# Patient Record
Sex: Male | Born: 1950 | Race: White | Hispanic: No | Marital: Single | Smoking: Former smoker
Health system: Southern US, Community
[De-identification: ages and names within clinical notes are randomized; demographics above are authoritative.]

## PROBLEM LIST (undated history)

## (undated) DIAGNOSIS — R7303 Prediabetes: Secondary | ICD-10-CM

## (undated) DIAGNOSIS — M1990 Unspecified osteoarthritis, unspecified site: Secondary | ICD-10-CM

## (undated) DIAGNOSIS — R55 Syncope and collapse: Secondary | ICD-10-CM

## (undated) DIAGNOSIS — F419 Anxiety disorder, unspecified: Secondary | ICD-10-CM

## (undated) DIAGNOSIS — I10 Essential (primary) hypertension: Secondary | ICD-10-CM

## (undated) DIAGNOSIS — F411 Generalized anxiety disorder: Secondary | ICD-10-CM

## (undated) DIAGNOSIS — Z86010 Personal history of colonic polyps: Secondary | ICD-10-CM

## (undated) DIAGNOSIS — M24662 Ankylosis, left knee: Secondary | ICD-10-CM

## (undated) DIAGNOSIS — L409 Psoriasis, unspecified: Secondary | ICD-10-CM

## (undated) DIAGNOSIS — T8859XA Other complications of anesthesia, initial encounter: Secondary | ICD-10-CM

## (undated) DIAGNOSIS — E785 Hyperlipidemia, unspecified: Secondary | ICD-10-CM

## (undated) DIAGNOSIS — Z860101 Personal history of adenomatous and serrated colon polyps: Secondary | ICD-10-CM

## (undated) DIAGNOSIS — N529 Male erectile dysfunction, unspecified: Secondary | ICD-10-CM

## (undated) DIAGNOSIS — T7840XA Allergy, unspecified, initial encounter: Secondary | ICD-10-CM

## (undated) DIAGNOSIS — R972 Elevated prostate specific antigen [PSA]: Secondary | ICD-10-CM

## (undated) DIAGNOSIS — N400 Enlarged prostate without lower urinary tract symptoms: Secondary | ICD-10-CM

## (undated) HISTORY — DX: Hyperlipidemia, unspecified: E78.5

## (undated) HISTORY — DX: Anxiety disorder, unspecified: F41.9

## (undated) HISTORY — DX: Allergy, unspecified, initial encounter: T78.40XA

## (undated) HISTORY — DX: Elevated prostate specific antigen (PSA): R97.20

## (undated) HISTORY — PX: APPENDECTOMY: SHX54

## (undated) HISTORY — PX: JOINT REPLACEMENT: SHX530

## (undated) HISTORY — DX: Unspecified osteoarthritis, unspecified site: M19.90

## (undated) HISTORY — DX: Psoriasis, unspecified: L40.9

## (undated) HISTORY — DX: Essential (primary) hypertension: I10

## (undated) HISTORY — PX: COLONOSCOPY: SHX174

---

## 2000-01-10 ENCOUNTER — Other Ambulatory Visit: Admission: RE | Admit: 2000-01-10 | Discharge: 2000-01-10 | Payer: Self-pay | Admitting: Urology

## 2010-02-14 ENCOUNTER — Encounter: Admission: RE | Admit: 2010-02-14 | Discharge: 2010-02-14 | Payer: Self-pay | Admitting: Family Medicine

## 2012-04-08 ENCOUNTER — Other Ambulatory Visit: Payer: Self-pay | Admitting: Internal Medicine

## 2012-04-10 ENCOUNTER — Other Ambulatory Visit: Payer: Self-pay

## 2012-04-10 ENCOUNTER — Encounter: Payer: Self-pay | Admitting: Physician Assistant

## 2012-04-10 NOTE — Telephone Encounter (Signed)
Error

## 2012-05-31 ENCOUNTER — Other Ambulatory Visit: Payer: Self-pay | Admitting: *Deleted

## 2012-05-31 ENCOUNTER — Ambulatory Visit (INDEPENDENT_AMBULATORY_CARE_PROVIDER_SITE_OTHER): Payer: BC Managed Care – PPO | Admitting: Internal Medicine

## 2012-05-31 ENCOUNTER — Encounter: Payer: Self-pay | Admitting: Internal Medicine

## 2012-05-31 VITALS — BP 126/88 | HR 78 | Temp 98.2°F | Resp 16 | Ht 66.5 in | Wt 176.4 lb

## 2012-05-31 DIAGNOSIS — I10 Essential (primary) hypertension: Secondary | ICD-10-CM | POA: Insufficient documentation

## 2012-05-31 DIAGNOSIS — E782 Mixed hyperlipidemia: Secondary | ICD-10-CM

## 2012-05-31 DIAGNOSIS — R972 Elevated prostate specific antigen [PSA]: Secondary | ICD-10-CM

## 2012-05-31 DIAGNOSIS — Z79899 Other long term (current) drug therapy: Secondary | ICD-10-CM

## 2012-05-31 DIAGNOSIS — F419 Anxiety disorder, unspecified: Secondary | ICD-10-CM

## 2012-05-31 DIAGNOSIS — E785 Hyperlipidemia, unspecified: Secondary | ICD-10-CM | POA: Insufficient documentation

## 2012-05-31 DIAGNOSIS — F411 Generalized anxiety disorder: Secondary | ICD-10-CM

## 2012-05-31 DIAGNOSIS — E789 Disorder of lipoprotein metabolism, unspecified: Secondary | ICD-10-CM

## 2012-05-31 DIAGNOSIS — Z0000 Encounter for general adult medical examination without abnormal findings: Secondary | ICD-10-CM

## 2012-05-31 LAB — COMPREHENSIVE METABOLIC PANEL
ALT: 20 U/L (ref 0–53)
AST: 19 U/L (ref 0–37)
Albumin: 4.4 g/dL (ref 3.5–5.2)
Alkaline Phosphatase: 42 U/L (ref 39–117)
CO2: 26 mEq/L (ref 19–32)
Calcium: 9 mg/dL (ref 8.4–10.5)
Glucose, Bld: 110 mg/dL — ABNORMAL HIGH (ref 70–99)
Sodium: 139 mEq/L (ref 135–145)
Total Bilirubin: 0.5 mg/dL (ref 0.3–1.2)
Total Protein: 6.5 g/dL (ref 6.0–8.3)

## 2012-05-31 MED ORDER — LISINOPRIL 5 MG PO TABS: 5.0000 mg | ORAL_TABLET | Freq: Every day | ORAL | Status: DC

## 2012-05-31 MED ORDER — CYCLOBENZAPRINE HCL 10 MG PO TABS: 10.0000 mg | ORAL_TABLET | Freq: Two times a day (BID) | ORAL | Status: DC | PRN

## 2012-05-31 MED ORDER — ALPRAZOLAM 1 MG PO TABS: 1.0000 mg | ORAL_TABLET | Freq: Every evening | ORAL | Status: DC | PRN

## 2012-05-31 NOTE — Progress Notes (Signed)
  Subjective:    Patient ID: John Wagner, male    DOB: 12/06/1951, 61 y.o.   MRN: [id number]  HPI All problems stable Exposed to mrsa at nursing place.   Review of Systems unchanged    Objective:   Physical Exam  Constitutional: He is oriented to person, place, and time. He appears well-developed and well-nourished.  HENT:  Right Ear: External ear normal.  Left Ear: External ear normal.  Mouth/Throat: Oropharynx is clear and moist.  Eyes: EOM are normal.  Neck: Normal range of motion.  Cardiovascular: Normal rate, regular rhythm and normal heart sounds.   Pulmonary/Chest: Effort normal and breath sounds normal.  Abdominal: Bowel sounds are normal. He exhibits no mass. There is no tenderness.  Neurological: He is alert and oriented to person, place, and time.  Skin: Skin is warm and dry.  Psychiatric: He has a normal mood and affect.    No results found for this or any previous visit.      Assessment & Plan:  All issues stable RF all meds

## 2012-05-31 NOTE — Patient Instructions (Signed)
Community-Associated MRSA CA-MRSA stands for community-associated methicillin-resistant Staphylococcus aureus. MRSA is a type of bacteria that is resistant to some common antibiotics. It can cause infections in the skin and many other places in the body. Staphylococcus aureus, often called "staph," is a bacteria that normally lives on the skin or in the nose. Staph on the surface of the skin or in the nose does not cause problems. However, if the staph enters the body through a cut, wound, or break in the skin, an infection can happen. Up until recently, infections with the MRSA type of staph mainly occurred in hospitals and other healthcare settings. There are now increasing problems with MRSA infections in the community as well. Infections with MRSA may be very serious or even life-threatening. CA-MRSA is becoming more common. It is known to spread in crowded settings, in jails and prisons, and in situations where there is close skin-to-skin contact, such as during sporting events or in locker rooms. MRSA can be spread through shared items, such as children's toys, razors, towels, or sports equipment.  CAUSES All staph, including MRSA, are normally harmless unless they enter the body through a scratch, cut, or wound, such as with surgery. All staph, including MRSA, can be spread from person-to-person by touching contaminated objects or through direct contact.  MRSA now causes illness in people who have not been in hospitals or other healthcare facilities. Cases of MRSA diseases in the community have been associated with:   Recent antibiotic use.   Sharing contaminated towels or clothes.   Having active skin diseases.   Participating in contact sports.   Living in crowded settings.   Intravenous (IV) drug use.   Community-associated MRSA infections are usually skin infections, but may cause other severe illnesses.   Staph bacteria are one of the most common causes of skin infection. However,  they are also a common cause of pneumonia, bone or joint infections, and bloodstream infections.  DIAGNOSIS Diagnosis of MRSA is done by cultures of fluid samples that may come from:  Swabs taken from cuts or wounds in infected areas.   Nasal swabs.   Saliva or deep cough specimens from the lungs (sputum).   Urine.   Blood.  Many people are "colonized" with MRSA but have no signs of infection. This means that people carry the MRSA germ on their skin or in their nose and may never develop MRSA infection.  TREATMENT  Treatment varies and is based on how serious, how deep, or how extensive the infection is. For example:  Some skin infections, such as a small boil or abscess, may be treated by draining yellowish-white fluid (pus) from the site of the infection.   Deeper or more widespread soft tissue infections are usually treated with surgery to drain pus and with antibiotic medicine given by vein or by mouth. This may be recommended even if you are pregnant.   Serious infections may require a hospital stay.  If antibiotics are given, they may be needed for several weeks. PREVENTION Because many people are colonized with staph, including MRSA, preventing the spread of the bacteria from person-to-person is most important. The best way to prevent the spread of bacteria and other germs is through proper hand washing or by using alcohol-based hand disinfectants. The following are other ways to help prevent MRSA infection within community settings.   Wash your hands frequently with soap and water for at least 15 seconds. Otherwise, use alcohol-based hand disinfectants when soap and water is not available.     Make sure people who live with you wash their hands often, too.   Do not share personal items. For example, avoid sharing razors and other personal hygiene items, towels, clothing, and athletic equipment.   Wash and dry your clothes and bedding at the warmest temperatures recommended on  the labels.   Keep wounds covered. Pus from infected sores may contain MRSA and other bacteria. Keep cuts and abrasions clean and covered with germ-free (sterile), dry bandages until they are healed.   If you have a wound that appears infected, ask your caregiver if a culture for MRSA and other bacteria should be done.   If you are breastfeeding, talk to your caregiver about MRSA. You may be asked to temporarily stop breastfeeding.  HOME CARE INSTRUCTIONS   Take your antibiotics as directed. Finish them even if you start to feel better.   Avoid close contact with those around you as much as possible. Do not use towels, razors, toothbrushes, bedding, or other items that will be used by others.   To fight the infection, follow your caregiver's instructions for wound care. Wash your hands before and after changing your bandages.   If you have an intravascular device, such as a catheter, make sure you know how to care for it.   Be sure to tell any healthcare providers that you have MRSA so they are aware of your infection.  SEEK IMMEDIATE MEDICAL CARE IF:  The infection appears to be getting worse. Signs include:   Increased warmth, redness, or tenderness around the wound site.   A red line that extends from the infection site.   A dark color in the area around the infection.   Wound drainage that is tan, yellow, or green.   A bad smell coming from the wound.   You feel sick to your stomach (nauseous) and throw up (vomit) or cannot keep medicine down.   You have a fever.   Your baby is older than 3 months with a rectal temperature of 102 F (38.9 C) or higher.   Your baby is 3 months old or younger with a rectal temperature of 100.4 F (38 C) or higher.   You have difficulty breathing.  MAKE SURE YOU:   Understand these instructions.   Will watch your condition.   Will get help right away if you are not doing well or get worse.  Document Released: 03/13/2006 Document  Revised: 11/27/2011 Document Reviewed: 03/13/2011 [employer] Patient Information 2012 [employer]. 

## 2012-08-09 ENCOUNTER — Other Ambulatory Visit: Payer: Self-pay | Admitting: Internal Medicine

## 2012-10-05 ENCOUNTER — Other Ambulatory Visit: Payer: Self-pay | Admitting: Internal Medicine

## 2012-10-05 NOTE — Telephone Encounter (Signed)
At tl desk 

## 2012-12-06 ENCOUNTER — Ambulatory Visit (INDEPENDENT_AMBULATORY_CARE_PROVIDER_SITE_OTHER): Payer: BC Managed Care – PPO | Admitting: Internal Medicine

## 2012-12-06 ENCOUNTER — Encounter: Payer: Self-pay | Admitting: Internal Medicine

## 2012-12-06 VITALS — BP 131/95 | HR 86 | Temp 97.8°F | Resp 16 | Ht 66.5 in | Wt 182.0 lb

## 2012-12-06 DIAGNOSIS — E785 Hyperlipidemia, unspecified: Secondary | ICD-10-CM

## 2012-12-06 DIAGNOSIS — L409 Psoriasis, unspecified: Secondary | ICD-10-CM | POA: Insufficient documentation

## 2012-12-06 DIAGNOSIS — F419 Anxiety disorder, unspecified: Secondary | ICD-10-CM

## 2012-12-06 DIAGNOSIS — F411 Generalized anxiety disorder: Secondary | ICD-10-CM

## 2012-12-06 DIAGNOSIS — Z0000 Encounter for general adult medical examination without abnormal findings: Secondary | ICD-10-CM

## 2012-12-06 DIAGNOSIS — I10 Essential (primary) hypertension: Secondary | ICD-10-CM

## 2012-12-06 DIAGNOSIS — Z79899 Other long term (current) drug therapy: Secondary | ICD-10-CM

## 2012-12-06 LAB — CBC WITH DIFFERENTIAL/PLATELET
Basophils Relative: 1 % (ref 0–1)
Eosinophils Absolute: 0.1 10*3/uL (ref 0.0–0.7)
Eosinophils Relative: 2 % (ref 0–5)
Lymphocytes Relative: 35 % (ref 12–46)
Lymphs Abs: 1.5 10*3/uL (ref 0.7–4.0)
MCH: 28.8 pg (ref 26.0–34.0)
Monocytes Absolute: 0.5 10*3/uL (ref 0.1–1.0)
Monocytes Relative: 13 % — ABNORMAL HIGH (ref 3–12)
Neutro Abs: 2.1 10*3/uL (ref 1.7–7.7)
Neutrophils Relative %: 49 % (ref 43–77)
RBC: 5.11 MIL/uL (ref 4.22–5.81)

## 2012-12-06 LAB — LIPID PANEL
HDL: 51 mg/dL (ref >39)
Total CHOL/HDL Ratio: 4.5 Ratio

## 2012-12-06 LAB — POCT URINALYSIS DIPSTICK
Ketones, UA: NEGATIVE
Leukocytes, UA: NEGATIVE
Nitrite, UA: NEGATIVE
Protein, UA: NEGATIVE
Spec Grav, UA: 1.015
Urobilinogen, UA: 0.2
pH, UA: 6.5

## 2012-12-06 LAB — POCT UA - MICROSCOPIC ONLY
Bacteria, U Microscopic: NEGATIVE
Yeast, UA: NEGATIVE

## 2012-12-06 LAB — COMPREHENSIVE METABOLIC PANEL
ALT: 24 U/L (ref 0–53)
Albumin: 4.2 g/dL (ref 3.5–5.2)
Alkaline Phosphatase: 49 U/L (ref 39–117)
Chloride: 105 mEq/L (ref 96–112)
Creat: 0.97 mg/dL (ref 0.50–1.35)
Sodium: 140 mEq/L (ref 135–145)
Total Protein: 6.5 g/dL (ref 6.0–8.3)

## 2012-12-06 MED ORDER — ALPRAZOLAM 1 MG PO TABS: 1.0000 mg | ORAL_TABLET | Freq: Two times a day (BID) | ORAL | Status: DC | PRN

## 2012-12-06 NOTE — Progress Notes (Signed)
  Subjective:    Patient ID: John Wagner, male    DOB: 1951-03-07, 61 y.o.   MRN: [id number]  HPI Doing well, elevated PSA is improving and Dr. Vernie Ammons did nl DRE October. Colonocopy 2 yrs ago, one polyp. HTN controlled   Review of Systems Joint pains are chronic, left knee the worst    Objective:   Physical Exam  Vitals reviewed. Constitutional: He is oriented to person, place, and time. He appears well-developed and well-nourished. No distress.  HENT:  Right Ear: External ear normal.  Left Ear: External ear normal.  Nose: Nose normal.  Mouth/Throat: Oropharynx is clear and moist.  Eyes: EOM are normal. Pupils are equal, round, and reactive to light. No scleral icterus.  Neck: Normal range of motion. No tracheal deviation present. No thyromegaly present.  Cardiovascular: Normal rate, regular rhythm and normal heart sounds.   Pulmonary/Chest: Effort normal and breath sounds normal.  Abdominal: Soft. Bowel sounds are normal. He exhibits no mass. There is no tenderness.  Genitourinary: Penis normal.  Musculoskeletal: Normal range of motion. He exhibits tenderness. He exhibits no edema.  Lymphadenopathy:    He has cervical adenopathy.  Neurological: He is alert and oriented to person, place, and time. No cranial nerve deficit. He exhibits normal muscle tone. Coordination normal.  Skin: Skin is warm and dry.  Psychiatric: He has a normal mood and affect. His behavior is normal. Judgment and thought content normal.   Results for orders placed in visit on 05/31/12  COMPREHENSIVE METABOLIC PANEL      Component Value Range   Sodium 139  135 - 145 mEq/L   Potassium 4.4  3.5 - 5.3 mEq/L   Chloride 105  96 - 112 mEq/L   CO2 26  19 - 32 mEq/L   Glucose, Bld 110 (*) 70 - 99 mg/dL   BUN 20  6 - 23 mg/dL   Creat 8.29  5.62 - 1.30 mg/dL   Total Bilirubin 0.5  0.3 - 1.2 mg/dL   Alkaline Phosphatase 42  39 - 117 U/L   AST 19  0 - 37 U/L   ALT 20  0 - 53 U/L   Total Protein 6.5  6.0 -  8.3 g/dL   Albumin 4.4  3.5 - 5.2 g/dL   Calcium 9.0  8.4 - 86.5 mg/dL          Assessment & Plan:  All issues are stable Will RF alprazolam today

## 2012-12-06 NOTE — Patient Instructions (Addendum)
Degenerative Arthritis You have osteoarthritis. This is the wear and tear arthritis that comes with aging. It is also called degenerative arthritis. This is common in people past middle age. It is caused by stress on the joints. The large weight bearing joints of the lower extremities are most often affected. The knees, hips, back, neck, and hands can become painful, swollen, and stiff. This is the most common type of arthritis. It comes on with age, carrying too much weight, or from an injury. Treatment includes resting the sore joint until the pain and swelling improve. Crutches or a walker may be needed for severe flares. Only take over-the-counter or prescription medicines for pain, discomfort, or fever as directed by your caregiver. Local heat therapy may improve motion. Cortisone shots into the joint are sometimes used to reduce pain and swelling during flares. Osteoarthritis is usually not crippling and progresses slowly. There are things you can do to decrease pain:  Avoid high impact activities.  Exercise regularly.  Low impact exercises such as walking, biking and swimming help to keep the muscles strong and keep normal joint function.  Stretching helps to keep your range of motion.  Lose weight if you are overweight. This reduces joint stress. In severe cases when you have pain at rest or increasing disability, joint surgery may be helpful. See your caregiver for follow-up treatment as recommended.  SEEK IMMEDIATE MEDICAL CARE IF:   You have severe joint pain.  Marked swelling and redness in your joint develops.  You develop a high fever. Document Released: 12/08/2005 Document Revised: 03/01/2012 Document Reviewed: 05/10/2007 [facility] Patient Information 2013 [place], [place]. Meniscus Tear with Phase I Rehab The meniscus is a C-shaped cartilage structure, located in the knee joint between the thigh bone (femur) and the shinbone (tibia). Two menisci are located in each knee joint:  the inner and outer meniscus. The meniscus acts as an adapter between the thigh bone and shinbone, allowing them to fit properly together. It also functions as a shock absorber, to reduce the stress placed on the knee joint and to help supply nutrients to the knee joint cartilage. As people age, the meniscus begins to harden and become more vulnerable to injury. Meniscus tears are a common injury, especially in older athletes. Inner meniscus tears are more common than outer meniscus tears.  SYMPTOMS   Pain in the knee, especially with standing or squatting with the affected leg.  Tenderness along the joint line.  Swelling in the knee joint (effusion), usually starting 1 to 2 days after injury.  Locking or catching of the knee joint, causing inability to straighten the knee completely.  Giving way or buckling of the knee. CAUSES  A meniscus tear occurs when a force is placed on the meniscus that is greater than it can handle. Common causes of injury include:  Direct hit (trauma) to the knee.  Twisting, pivoting, or cutting (rapidly changing direction while running), kneeling or squatting.  Without injury, due to aging. RISK INCREASES WITH:  Contact sports (football, rugby).  Sports in which cleats are used with pivoting (soccer, lacrosse) or sports in which good shoe grip and sudden change in direction are required (racquetball, basketball, squash).  Previous knee injury.  Associated knee injury, particularly ligament injuries.  Poor strength and flexibility. PREVENTION  Warm up and stretch properly before activity.  Maintain physical fitness:  Strength, flexibility, and endurance.  Cardiovascular fitness.  Protect the knee with a brace or elastic bandage.  Wear properly fitted protective equipment (proper  cleats for the surface). PROGNOSIS  Sometimes, meniscus tears heal on their own. However, definitive treatment requires surgery, followed by at least 6 weeks of  recovery.  RELATED COMPLICATIONS   Recurring symptoms that result in a chronic problem.  Repeated knee injury, especially if sports are resumed too soon after injury or surgery.  Progression of the tear (the tear gets larger), if untreated.  Arthritis of the knee in later years (with or without surgery).  Complications of surgery, including infection, bleeding, injury to nerves (numbness, weakness, paralysis) continued pain, giving way, locking, nonhealing of meniscus (if repaired), need for further surgery, and knee stiffness (loss of motion). TREATMENT  Treatment first involves the use of ice and medicine, to reduce pain and inflammation. You may find using crutches to walk more comfortable. However, it is okay to bear weight on the injured knee, if the pain will allow it. Surgery is often advised as a definitive treatment. Surgery is performed through an incision near the joint (arthroscopically). The torn piece of the meniscus is removed, and if possible the joint cartilage is repaired. After surgery, the joint must be restrained. After restraint, it is important to perform strengthening and stretching exercises to help regain strength and a full range of motion. These exercises may be completed at home or with a therapist.  MEDICATION  If pain medicine is needed, nonsteroidal anti-inflammatory medicines (aspirin and ibuprofen), or other minor pain relievers (acetaminophen), are often advised.  Do not take pain medicine for 7 days before surgery.  Prescription pain relievers may be given, if your caregiver thinks they are needed. Use only as directed and only as much as you need. HEAT AND COLD  Cold treatment (icing) should be applied for 10 to 15 minutes every 2 to 3 hours for inflammation and pain, and immediately after activity that aggravates your symptoms. Use ice packs or an ice massage.  Heat treatment may be used before performing stretching and strengthening activities  prescribed by your caregiver, physical therapist, or athletic trainer. Use a heat pack or a warm water soak. SEEK MEDICAL CARE IF:   Symptoms get worse or do not improve in 2 weeks, despite treatment.  New, unexplained symptoms develop. (Drugs used in treatment may produce side effects.) EXERCISES RANGE OF MOTION (ROM) AND STRETCHING EXERCISES - Meniscus Tear, Non-operative, Phase I These are some of the initial exercises with which you may start your rehabilitation program, until you see your caregiver again or until your symptoms are resolved. Remember:   These initial exercises are intended to be gentle. They will help you restore motion without increasing any swelling.  Completing these exercises allows less painful movement and prepares you for the more aggressive strengthening exercises in Phase II.  An effective stretch should be held for at least 30 seconds.  A stretch should never be painful. You should only feel a gentle lengthening or release in the stretched tissue. RANGE OF MOTION - Knee Flexion, Active  Lie on your back with both knees straight. (If this causes back discomfort, bend your healthy knee, placing your foot flat on the floor.)  Slowly slide your heel back toward your buttocks until you feel a gentle stretch in the front of your knee or thigh.  Hold for __________ seconds. Slowly slide your heel back to the starting position. Repeat __________ times. Complete this exercise __________ times per day.  RANGE OF MOTION - Knee Flexion and Extension, Active-Assisted  Sit on the edge of a table or chair with  your thighs firmly supported. It may be helpful to place a folded towel under the end of your right / left thigh.  Flexion (bending): Place the ankle of your healthy leg on top of the other ankle. Use your healthy leg to gently bend your right / left knee until you feel a mild tension across the top of your knee.  Hold for __________ seconds.  Extension  (straightening): Switch your ankles so your right / left leg is on top. Use your healthy leg to straighten your right / left knee until you feel a mild tension on the backside of your knee.  Hold for __________ seconds. Repeat __________ times. Complete __________ times per day. STRETCH - Knee Flexion, Supine  Lie on the floor with your right / left heel and foot lightly touching the wall. (Place both feet on the wall if you do not use a door frame.)  Without using any effort, allow gravity to slide your foot down the wall slowly until you feel a gentle stretch in the front of your right / left knee.  Hold this stretch for __________ seconds. Then return the leg to the starting position, using your healthy leg for help, if needed. Repeat __________ times. Complete this stretch __________ times per day.  STRETCH - Knee Extension Sitting  Sit with your right / left leg/heel propped on another chair, coffee table, or foot stool.  Allow your leg muscles to relax, letting gravity straighten out your knee.*  You should feel a stretch behind your right / left knee. Hold this position for __________ seconds. Repeat __________ times. Complete this stretch __________ times per day.  *Your physician, physical therapist or athletic trainer may instruct you place a __________ weight on your thigh, just above your kneecap, to deepen the stretch.  STRENGTHENING EXERCISES - Meniscus Tear, Non-operative, Phase I These exercises may help you when beginning to rehabilitate your injury. They may resolve your symptoms with or without further involvement from your physician, physical therapist or athletic trainer. While completing these exercises, remember:   Muscles can gain both the endurance and the strength needed for everyday activities through controlled exercises.  Complete these exercises as instructed by your physician, physical therapist or athletic trainer. Progress the resistance and repetitions only  as guided. STRENGTH - Quadriceps, Isometrics  Lie on your back with your right / left leg extended and your opposite knee bent.  Gradually tense the muscles in the front of your right / left thigh. You should see either your knee cap slide up toward your hip or increased dimpling just above the knee. This motion will push the back of the knee down toward the floor, mat, or bed on which you are lying.  Hold the muscle as tight as you can, without increasing your pain, for __________ seconds.  Relax the muscles slowly and completely between each repetition. Repeat __________ times. Complete this exercise __________ times per day.  STRENGTH - Quadriceps, Short Arcs   Lie on your back. Place a __________ inch towel roll under your right / left knee, so that the knee bends slightly.  Raise only your lower leg by tightening the muscles in the front of your thigh. Do not allow your thigh to rise.  Hold this position for __________ seconds. Repeat __________ times. Complete this exercise __________ times per day.  OPTIONAL ANKLE WEIGHTS: Begin with ____________________, but DO NOT exceed ____________________. Increase in 1 pound/0.5 kilogram increments. STRENGTH - Quadriceps, Straight Leg Raises  Quality counts!  Watch for signs that the quadriceps muscle is working, to be sure you are strengthening the correct muscles and not "cheating" by substituting with healthier muscles.  Lay on your back with your right / left leg extended and your opposite knee bent.  Tense the muscles in the front of your right / left thigh. You should see either your knee cap slide up or increased dimpling just above the knee. Your thigh may even shake a bit.  Tighten these muscles even more and raise your leg 4 to 6 inches off the floor. Hold for __________ seconds.  Keeping these muscles tense, lower your leg.  Relax the muscles slowly and completely in between each repetition. Repeat __________ times. Complete  this exercise __________ times per day.  STRENGTH - Hamstring, Curls   Lay on your stomach with your legs extended. (If you lay on a bed, your feet may hang over the edge.)  Tighten the muscles in the back of your thigh to bend your right / left knee up to 90 degrees. Keep your hips flat on the bed.  Hold this position for __________ seconds.  Slowly lower your leg back to the starting position. Repeat __________ times. Complete this exercise __________ times per day.  STRENGTH  Quadriceps, Squats  Stand in a door frame so that your feet and knees are in line with the frame.  Use your hands for balance, not support, on the frame.  Slowly lower your weight, bending at the hips and knees. Keep your lower legs upright so that they are parallel with the door frame. Squat only within the range that does not increase your knee pain. Never let your hips drop below your knees.  Slowly return upright, pushing with your legs, not pulling with your hands. Repeat __________ times. Complete this exercise __________ times per day.  STRENGTH - Quad/VMO, Isometric   Sit in a chair with your right / left knee slightly bent. With your fingertips, feel the VMO muscle just above the inside of your knee. The VMO is important in controlling the position of your kneecap.  Keeping your fingertips on this muscle. Without actually moving your leg, attempt to drive your knee down as if straightening your leg. You should feel your VMO tense. If you have a difficult time, you may wish to try the same exercise on your healthy knee first.  Tense this muscle as hard as you can without increasing any knee pain.  Hold for __________ seconds. Relax the muscles slowly and completely in between each repetition. Repeat __________ times. Complete exercise __________ times per day.  Document Released: 12/22/1998 Document Revised: 03/01/2012 Document Reviewed: 03/22/2009 [facility] Patient Information 2013 [place], [place].

## 2012-12-06 NOTE — Progress Notes (Signed)
  Subjective:    Patient ID: John Wagner, male    DOB: December 18, 1951, 61 y.o.   MRN: [id number]  HPI    Review of Systems  Constitutional: Positive for fatigue.  Musculoskeletal: Positive for back pain, joint swelling and arthralgias.       Objective:   Physical Exam        Assessment & Plan:

## 2012-12-18 ENCOUNTER — Other Ambulatory Visit: Payer: Self-pay | Admitting: Internal Medicine

## 2013-02-05 ENCOUNTER — Other Ambulatory Visit: Payer: Self-pay

## 2013-06-30 ENCOUNTER — Other Ambulatory Visit: Payer: Self-pay | Admitting: Internal Medicine

## 2013-06-30 NOTE — Telephone Encounter (Signed)
Ok rf for 3 mos

## 2013-07-01 NOTE — Telephone Encounter (Signed)
Printed and signed. Faxed to pharmacy.

## 2013-07-18 ENCOUNTER — Ambulatory Visit: Payer: BC Managed Care – PPO | Admitting: Internal Medicine

## 2013-07-24 ENCOUNTER — Other Ambulatory Visit: Payer: Self-pay | Admitting: Internal Medicine

## 2013-07-25 ENCOUNTER — Ambulatory Visit (INDEPENDENT_AMBULATORY_CARE_PROVIDER_SITE_OTHER): Payer: BC Managed Care – PPO | Admitting: Internal Medicine

## 2013-07-25 ENCOUNTER — Encounter: Payer: Self-pay | Admitting: Internal Medicine

## 2013-07-25 VITALS — BP 110/70 | HR 65 | Temp 97.9°F | Resp 16 | Ht 66.0 in | Wt 178.0 lb

## 2013-07-25 DIAGNOSIS — I10 Essential (primary) hypertension: Secondary | ICD-10-CM

## 2013-07-25 DIAGNOSIS — F411 Generalized anxiety disorder: Secondary | ICD-10-CM

## 2013-07-25 DIAGNOSIS — E789 Disorder of lipoprotein metabolism, unspecified: Secondary | ICD-10-CM

## 2013-07-25 DIAGNOSIS — R972 Elevated prostate specific antigen [PSA]: Secondary | ICD-10-CM

## 2013-07-25 DIAGNOSIS — F419 Anxiety disorder, unspecified: Secondary | ICD-10-CM

## 2013-07-25 DIAGNOSIS — E785 Hyperlipidemia, unspecified: Secondary | ICD-10-CM

## 2013-07-25 DIAGNOSIS — Z79899 Other long term (current) drug therapy: Secondary | ICD-10-CM

## 2013-07-25 MED ORDER — CYCLOBENZAPRINE HCL 10 MG PO TABS: 10.0000 mg | ORAL_TABLET | Freq: Two times a day (BID) | ORAL | Status: DC | PRN

## 2013-07-25 MED ORDER — LISINOPRIL 5 MG PO TABS: 5.0000 mg | ORAL_TABLET | Freq: Every day | ORAL | Status: DC

## 2013-07-25 MED ORDER — ALPRAZOLAM 1 MG PO TABS: 1.0000 mg | ORAL_TABLET | Freq: Two times a day (BID) | ORAL | Status: DC | PRN

## 2013-07-25 NOTE — Patient Instructions (Addendum)

## 2013-07-25 NOTE — Progress Notes (Signed)
  Subjective:    Patient ID: John Wagner, male    DOB: 1951/10/21, 62 y.o.   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 Doing well, urologist following high PSA, no cancer. HTN controlled. Cannot tolerate statins, does not want to try qod. Anxiety and chronic pain controlled  Review of Systems ok    Objective:   Physical Exam  Vitals reviewed. Constitutional: He is oriented to person, place, and time. He appears well-developed and well-nourished. No distress.  Neck: Neck supple.  Cardiovascular: Normal rate and normal heart sounds.   Pulmonary/Chest: Effort normal and breath sounds normal.  Neurological: He is alert and oriented to person, place, and time.  Psychiatric: He has a normal mood and affect.          Assessment & Plan:  RF meds 6 mos

## 2013-07-26 LAB — BASIC METABOLIC PANEL
Calcium: 9.4 mg/dL (ref 8.4–10.5)
Chloride: 104 mEq/L (ref 96–112)

## 2013-08-26 ENCOUNTER — Other Ambulatory Visit: Payer: Self-pay | Admitting: Internal Medicine

## 2013-08-26 NOTE — Telephone Encounter (Signed)
Needs office visit.

## 2013-10-27 ENCOUNTER — Other Ambulatory Visit: Payer: Self-pay

## 2013-11-28 ENCOUNTER — Other Ambulatory Visit: Payer: Self-pay | Admitting: Internal Medicine

## 2013-11-28 ENCOUNTER — Other Ambulatory Visit: Payer: Self-pay | Admitting: Radiology

## 2013-11-28 DIAGNOSIS — R972 Elevated prostate specific antigen [PSA]: Secondary | ICD-10-CM

## 2013-11-28 DIAGNOSIS — I10 Essential (primary) hypertension: Secondary | ICD-10-CM

## 2013-11-28 DIAGNOSIS — F419 Anxiety disorder, unspecified: Secondary | ICD-10-CM

## 2013-11-28 DIAGNOSIS — E789 Disorder of lipoprotein metabolism, unspecified: Secondary | ICD-10-CM

## 2013-11-28 DIAGNOSIS — E785 Hyperlipidemia, unspecified: Secondary | ICD-10-CM

## 2013-11-28 DIAGNOSIS — F411 Generalized anxiety disorder: Secondary | ICD-10-CM

## 2013-11-28 DIAGNOSIS — Z79899 Other long term (current) drug therapy: Secondary | ICD-10-CM

## 2013-11-28 MED ORDER — ALPRAZOLAM 1 MG PO TABS: 1.0000 mg | ORAL_TABLET | Freq: Two times a day (BID) | ORAL | Status: DC | PRN

## 2013-11-28 NOTE — Telephone Encounter (Signed)
ok 

## 2013-11-28 NOTE — Telephone Encounter (Signed)
Patient requesting renewal of Alprazolam to [facility] , pended please advise.

## 2013-11-28 NOTE — Telephone Encounter (Signed)
Ok rf

## 2013-12-05 ENCOUNTER — Encounter: Payer: BC Managed Care – PPO | Admitting: Internal Medicine

## 2013-12-12 ENCOUNTER — Encounter: Payer: BC Managed Care – PPO | Admitting: Internal Medicine

## 2013-12-19 ENCOUNTER — Ambulatory Visit (INDEPENDENT_AMBULATORY_CARE_PROVIDER_SITE_OTHER): Payer: BC Managed Care – PPO | Admitting: Internal Medicine

## 2013-12-19 ENCOUNTER — Encounter: Payer: Self-pay | Admitting: Internal Medicine

## 2013-12-19 VITALS — BP 136/96 | HR 86 | Temp 98.1°F | Resp 16 | Ht 67.0 in | Wt 179.2 lb

## 2013-12-19 DIAGNOSIS — I10 Essential (primary) hypertension: Secondary | ICD-10-CM

## 2013-12-19 DIAGNOSIS — G4700 Insomnia, unspecified: Secondary | ICD-10-CM

## 2013-12-19 DIAGNOSIS — Z0000 Encounter for general adult medical examination without abnormal findings: Secondary | ICD-10-CM

## 2013-12-19 DIAGNOSIS — F411 Generalized anxiety disorder: Secondary | ICD-10-CM

## 2013-12-19 DIAGNOSIS — E785 Hyperlipidemia, unspecified: Secondary | ICD-10-CM

## 2013-12-19 DIAGNOSIS — Z79899 Other long term (current) drug therapy: Secondary | ICD-10-CM

## 2013-12-19 DIAGNOSIS — R972 Elevated prostate specific antigen [PSA]: Secondary | ICD-10-CM

## 2013-12-19 LAB — POCT UA - MICROSCOPIC ONLY
Bacteria, U Microscopic: NEGATIVE
RBC, urine, microscopic: NEGATIVE

## 2013-12-19 LAB — CBC WITH DIFFERENTIAL/PLATELET
Basophils Absolute: 0 10*3/uL (ref 0.0–0.1)
HCT: 45.1 % (ref 39.0–52.0)
Hemoglobin: 15.4 g/dL (ref 13.0–17.0)
Lymphocytes Relative: 33 % (ref 12–46)
Lymphs Abs: 1.3 10*3/uL (ref 0.7–4.0)
MCV: 84 fL (ref 78.0–100.0)
Monocytes Absolute: 0.5 10*3/uL (ref 0.1–1.0)
Monocytes Relative: 12 % (ref 3–12)
Neutro Abs: 2.2 10*3/uL (ref 1.7–7.7)
RBC: 5.37 MIL/uL (ref 4.22–5.81)
RDW: 14.5 % (ref 11.5–15.5)
WBC: 4.1 10*3/uL (ref 4.0–10.5)

## 2013-12-19 LAB — POCT URINALYSIS DIPSTICK
Bilirubin, UA: NEGATIVE
Glucose, UA: NEGATIVE
Ketones, UA: NEGATIVE
Leukocytes, UA: NEGATIVE
Spec Grav, UA: 1.01

## 2013-12-19 LAB — COMPLETE METABOLIC PANEL WITH GFR
AST: 22 U/L (ref 0–37)
Albumin: 4.3 g/dL (ref 3.5–5.2)
BUN: 11 mg/dL (ref 6–23)
CO2: 26 mEq/L (ref 19–32)
Calcium: 9.1 mg/dL (ref 8.4–10.5)
Chloride: 103 mEq/L (ref 96–112)
GFR, Est African American: >89 mL/min
Potassium: 4.3 mEq/L (ref 3.5–5.3)

## 2013-12-19 LAB — TSH: TSH: 0.976 u[IU]/mL (ref 0.350–4.500)

## 2013-12-19 LAB — LIPID PANEL
Cholesterol: 261 mg/dL — ABNORMAL HIGH (ref 0–200)
HDL: 47 mg/dL (ref >39)
Triglycerides: 175 mg/dL — ABNORMAL HIGH (ref <150)

## 2013-12-19 MED ORDER — ALPRAZOLAM 1 MG PO TABS: ORAL_TABLET | ORAL | Status: DC

## 2013-12-19 MED ORDER — COLESTIPOL HCL 5 G PO GRAN: 5.0000 g | GRANULES | Freq: Two times a day (BID) | ORAL | Status: DC

## 2013-12-19 MED ORDER — CYCLOBENZAPRINE HCL 10 MG PO TABS: ORAL_TABLET | ORAL | Status: DC

## 2013-12-19 NOTE — Progress Notes (Signed)
   Subjective:    Patient ID: John Wagner, male    DOB: Sep 14, 1951, 62 y.o.   MRN: [id number]  HPI Gaining weight, appetite great. Saw urologist last month, PSA went down to 6.9, he is watching it. HTN controlled, meds tolerated.   Review of Systems  Constitutional: Positive for diaphoresis and fatigue.  Eyes: Negative.   Respiratory: Negative.   Cardiovascular: Negative.   Gastrointestinal: Negative.   Endocrine: Negative.   Genitourinary: Negative.   Musculoskeletal: Positive for arthralgias.  Skin: Negative.   Allergic/Immunologic: Negative.   Hematological: Negative.   Psychiatric/Behavioral: The patient is nervous/anxious.        Objective:   Physical Exam  Constitutional: He is oriented to person, place, and time. He appears well-developed and well-nourished.  HENT:  Head: Normocephalic.  Right Ear: External ear normal.  Left Ear: External ear normal.  Nose: Nose normal.  Mouth/Throat: Oropharynx is clear and moist.  Eyes: Conjunctivae and EOM are normal. Pupils are equal, round, and reactive to light.  Neck: Normal range of motion. Neck supple. No tracheal deviation present. No thyromegaly present.  Cardiovascular: Normal rate, regular rhythm, normal heart sounds and intact distal pulses.   Pulmonary/Chest: Effort normal and breath sounds normal.  Abdominal: Soft. There is no tenderness.  Genitourinary: Penis normal.  Musculoskeletal: Normal range of motion.  Neurological: He is alert and oriented to person, place, and time. He has normal reflexes. No cranial nerve deficit. He exhibits normal muscle tone. Coordination normal.  Skin: No rash noted.  Psychiatric: He has a normal mood and affect. His behavior is normal. Judgment normal.    EKG normal      Assessment & Plan:  Follow PSA with urology Anxiety controlled with prn alprazolam--Performance anxiety HTN controlled Trial of colestid for cholesterol lowering/Start trial and error/Follow up 3-4  months

## 2013-12-19 NOTE — Progress Notes (Signed)
   Subjective:    Patient ID: John Wagner, male    DOB: [birth year], 62 y.o.   MRN: [id number]  HPI 62 yr old Caucasian male is here today for an annual physical. Pt is fasting. Pt would like information regarding DNA.    Review of Systems     Objective:   Physical Exam        Assessment & Plan:

## 2013-12-19 NOTE — Patient Instructions (Addendum)
Genetic Counseling Genetic counseling helps people look at the risks of genetic problems in pregnancy. Genetic problems are problems that may be passed from the mother and father to their child.  Genetic counseling is done:  To see if a couple is at risk of passing on a problem.  To explain the cause of a disorder if present.  To understand the odds passing it on to the baby.  To determine what can be done medically.  To use in family planning to prevent or avoid a problem. Genetic counseling can be done by:  A geneticist.  A physician with special training in genetics.  Genetic counselors. The following are reasons to seek a referral for genetic counseling and/or genetic evaluation with a genetic physician: FAMILY HISTORY FACTORS   Mental retardation.  Neural tube defects (such as spina bifida).  Chromosome abnormalities (such as Down syndrome, Fragile X syndrome).  Cleft lip/cleft palate.  Heart defects.  Short stature.  Single gene defects (such as cystic fibrosis or PKU).  Hearing or visual impairments.  Learning disabilities.  Psychiatric disorders.  Cancers (breast, uterus, ovary and intestine).  Multiple pregnancy losses (miscarriages, stillbirths, or infant deaths).  Other disorders which could be considered genetic.  Either parent with a dominant disorder or any disorder seen in several generations including:  High blood pressure.  High cholesterol.  Muscular dystrophy.  Huntington's chorea.  Polycystic kidney. IF BOTH PARENTS ARE CARRIERS FOR:   Depression.  Seizures (convulsions).  Alcoholism.  Diabetes.  Thyroid disorder.  Others in which birth defects may be associated either with the disease process or with common medications prescribed for the disease.  Fetal or parental exposure to potentially harmful agents. These include:  Drugs.  Chemicals.  Infection.  Radiation.  Infertility cases where either parent is suspected  of having a chromosome abnormality.  Couples requiring assisted reproductive techniques to achieve a pregnancy, or individuals donating eggs or sperm for those purposes. OTHER FACTORS   Persons in specific ethnic groups or geographic areas with a higher incidence of certain disorders. These include Tay Sachs disease, sickle cell disease, or thalassemia.  Extreme parental concern or fear of having a child with a birth defect.  Parents are blood relatives (consanguinity) or incest in a pregnancy is involved.  Premarital or preconception counseling in couples at high risk for genetic disorders based on family or personal medical history.  The birth of an affected child or by carrier screening.  Mother, known, or presumed carrier of an X-linked disorder such as hemophilia.  Either parent is a known carrier of a balanced chromosome abnormality.  A previous baby with a genetic disorder.  After the baby is born, you find out the baby has a genetic disorder. PREGNANCY FACTORS   The mother is over 35 years old a the time of delivery.  Maternal serum screening indicating an increased risk for neural tube defects, Down Syndrome, or trisomy 18.  Abnormal prenatal diagnostic test results or abnormal prenatal ultrasound examination.  Maternal factors such as:  Schizophrenia.  Seizures.  Depression.  Alcoholism.  Diabetes.  Thyroid disorder.  Other factors in which birth defects may be associated either with the disease process or with common medications prescribed for the disease.  Fetal or parental exposure to potentially harmful agents such as:  Drugs.  Chemicals.  Radiation.  Infection.  The father is over the age of 55 years at the time of conception.  Infertility cases where either parent is suspected of having a chromosome abnormality.    Couples requiring assisted reproductive techniques to achieve a pregnancy, or individuals donating eggs or sperm for those  purposes.  Recurrent pregnancy loss.  Stillborn baby. FINDING A GENETIC COUNSELOR Most caregivers know and have a list of genetic counselors. Your caregiver can refer you to one of them. You can also contact the [employer] for their locations and for more information.  AFTER RECEIVING GENETIC COUNSELING Genetic counselors can help you understand what options you have. They can help you understand what to do next. They can share the experiences of other couples who had babies with genetic problems. If you are at high risk for having a baby with a severe or fatal genetic disorder, you have several options:  Have in vitro fertilization with healthy eggs that were fertilized in vitro.  Use donor sperm or eggs.  Adopt a baby. Genetic counselors can also help if you find out when you are pregnant that the baby has a severe or fatal genetic disorder. Some options are:  Help you prepare for a particular disorder by speaking to:  Specialists in caring for Down syndrome.  Surgeon for heart defects.  Social workers.  Support groups.  Fetal surgery is sometimes possible and helpful to treat some genetic defects.  Ending the pregnancy. Document Released: 02/28/2003 Document Revised: 03/01/2012 Document Reviewed: 01/19/2008 [facility] Patient Information 2014 [place], [place]. Calorie Counting Diet A calorie counting diet requires you to eat the number of calories that are right for you in a day. Calories are the measurement of how much energy you get from the food you eat. Eating the right amount of calories is important for staying at a healthy weight. If you eat too many calories, your body will store them as fat and you may gain weight. If you eat too few calories, you may lose weight. Counting the number of calories you eat during a day will help you know if you are eating the right amount. A Registered Dietitian can determine how many calories you need in a day. The  amount of calories needed varies from person to person. If your goal is to lose weight, you will need to eat fewer calories. Losing weight can benefit you if you are overweight or have health problems such as heart disease, high blood pressure, or diabetes. If your goal is to gain weight, you will need to eat more calories. Gaining weight may be necessary if you have a certain health problem that causes your body to need more energy. TIPS Whether you are increasing or decreasing the number of calories you eat during a day, it may be hard to get used to changes in what you eat and drink. The following are tips to help you keep track of the number of calories you eat.  Measure foods at home with measuring cups. This helps you know the amount of food and number of calories you are eating.  Restaurants often serve food in amounts that are larger than 1 serving. While eating out, estimate how many servings of a food you are given. For example, a serving of cooked rice is  cup or about the size of half of a fist. Knowing serving sizes will help you be aware of how much food you are eating at restaurants.  Ask for smaller portion sizes or child-size portions at restaurants.  Plan to eat half of a meal at a restaurant. Take the rest home or share the other half with a friend.  Read the Nutrition  Facts panel on food labels for calorie content and serving size. You can find out how many servings are in a package, the size of a serving, and the number of calories each serving has.  For example, a package might contain 3 cookies. The Nutrition Facts panel on that package says that 1 serving is 1 cookie. Below that, it will say there are 3 servings in the container. The calories section of the Nutrition Facts label says there are 90 calories. This means there are 90 calories in 1 cookie (1 serving). If you eat 1 cookie you have eaten 90 calories. If you eat all 3 cookies, you have eaten 270 calories (3 servings x  90 calories = 270 calories). The list below tells you how big or small some common portion sizes are.  1 oz.........4 stacked dice.  3 oz........[place] KitchenDeck of cards.  1 tsp.......[place] KitchenTip of little finger.  1 tbs......[place] KitchenMarland KitchenThumb.  2 tbs.......[place] KitchenGolf ball.   cup......[place] KitchenHalf of a fist.  1 cup.......[place] KitchenA fist. KEEP A FOOD LOG Write down every food item you eat, the amount you eat, and the number of calories in each food you eat during the day. At the end of the day, you can add up the total number of calories you have eaten. It may help to keep a list like the one below. Find out the calorie information by reading the Nutrition Facts panel on food labels. Breakfast  Bran cereal (1 cup, 110 calories).  Fat-free milk ( cup, 45 calories). Snack  Apple (1 medium, 80 calories). Lunch  Spinach (1 cup, 20 calories).  Tomato ( medium, 20 calories).  Chicken breast strips (3 oz, 165 calories).  Shredded cheddar cheese ( cup, 110 calories).  Light [place] dressing (2 tbs, 60 calories).  Whole-wheat bread (1 slice, 80 calories).  Tub margarine (1 tsp, 35 calories).  Vegetable soup (1 cup, 160 calories). Dinner  Pork chop (3 oz, 190 calories).  Brown rice (1 cup, 215 calories).  Steamed broccoli ( cup, 20 calories).  Strawberries (1  cup, 65 calories).  Whipped cream (1 tbs, 50 calories). Daily Calorie Total: 1425 Document Released: 12/08/2005 Document Revised: 03/01/2012 Document Reviewed: 06/04/2007 [facility] Patient Information 2014 [place], [place]. Colestipol granules for oral suspension What is this medicine? COLESTIPOL (koe LES ti pole) is used to lower cholesterol in patients who are at risk of heart disease or stroke. This medicine is only for patients whose cholesterol level is not controlled by diet. This medicine may be used for other purposes; ask your health care provider or pharmacist if you have questions. COMMON BRAND NAME(S): Colestid What should I tell my health  care provider before I take this medicine? They need to know if you have any of these conditions: -constipation or bowel obstruction -phenylketonuria -an unusual or allergic reaction to colestipol, other medicines, foods, dyes, or preservatives -pregnant or trying to get pregnant -breast-feeding How should I use this medicine? This medicine is mixed into a liquid and taken by mouth. DO NOT take this medicine in the dry form. Follow the directions on the prescription label. Add the granules to 3 or more ounces of water, milk, fruit juice, pulpy fruit juice, fluid soup, or other liquid. Mix well and drink all the liquid (it will not dissolve). Rinse the glass with some additional liquid and swallow to make sure you take the full dose. If you use a carbonated drink, use a larger glass as the mixture will foam. Take other medicines at least 1 hour before or  4 hours after this medicine. Take your medicine at regular intervals. Do not take it more often than directed. Talk to your pediatrician regarding the use of this medicine in children. Special care may be needed. Overdosage: If you think you have taken too much of this medicine contact a poison control center or emergency room at once. NOTE: This medicine is only for you. Do not share this medicine with others. What if I miss a dose? If you miss a dose, take it as soon as you can. If it is almost time for your next dose, take only that dose. Do not take double or extra doses. What may interact with this medicine? -diuretics -gemfibrozil -heart medicines such as digoxin or digitoxin -penicillin -phytonadione (vitamin k) -propranolol -tetracycline antibiotics -vitamin A -vitamin D This list may not describe all possible interactions. Give your health care provider a list of all the medicines, herbs, non-prescription drugs, or dietary supplements you use. Also tell them if you smoke, drink alcohol, or use illegal drugs. Some items may interact  with your medicine. What should I watch for while using this medicine? Visit your doctor or health care professional for regular checks on your progress. Your blood fats and other tests will be measured from time to time. This medicine is only part of a total cholesterol-lowering program. Your health care professional or dietician can suggest a low-cholesterol and low-fat diet that will reduce your risk of getting heart and blood vessel disease. Avoid alcohol and smoking, and keep a proper exercise schedule. To reduce the chance of getting constipated, drink plenty of water and increase the amount of fiber in your diet. Ask your doctor or health care professional for advice if you are constipated. What side effects may I notice from receiving this medicine? Side effects that you should report to your doctor or health care professional as soon as possible: -allergic reactions like skin rash, itching or hives, swelling of the face, lips, or tongue -bloody or black, tarry stools -breathing problems -severe stomach pain with nausea and vomiting -unusual bleeding or bruising -weight loss Side effects that usually do not require medical attention (report to your doctor or health care professional if they continue or are bothersome): -diarrhea -dizziness -headache -indigestion -nausea, vomiting -stomach upset, belching, or bloating This list may not describe all possible side effects. Call your doctor for medical advice about side effects. You may report side effects to FDA at 1-800-FDA-1088. Where should I keep my medicine? Keep out of the reach of children. Store at room temperature between 20 and 25 degrees C (68 and 77 degrees F). Throw away any unused medicine after the expiration date. NOTE: This sheet is a summary. It may not cover all possible information. If you have questions about this medicine, talk to your doctor, pharmacist, or health care provider.  2014, Elsevier/Gold Standard.  (2012-01-07 11:40:30)

## 2013-12-20 LAB — PSA: PSA: 6.91 ng/mL — ABNORMAL HIGH

## 2013-12-22 ENCOUNTER — Encounter: Payer: Self-pay | Admitting: *Deleted

## 2013-12-26 ENCOUNTER — Ambulatory Visit: Payer: BC Managed Care – PPO | Admitting: Family Medicine

## 2013-12-31 ENCOUNTER — Ambulatory Visit (INDEPENDENT_AMBULATORY_CARE_PROVIDER_SITE_OTHER): Payer: BC Managed Care – PPO | Admitting: Emergency Medicine

## 2013-12-31 VITALS — BP 118/94 | HR 83 | Temp 98.3°F | Resp 16 | Ht 66.0 in | Wt 182.0 lb

## 2013-12-31 DIAGNOSIS — J111 Influenza due to unidentified influenza virus with other respiratory manifestations: Secondary | ICD-10-CM

## 2013-12-31 MED ORDER — PSEUDOEPHEDRINE-GUAIFENESIN ER 60-600 MG PO TB12: 1.0000 | ORAL_TABLET | Freq: Two times a day (BID) | ORAL | Status: DC

## 2013-12-31 MED ORDER — OSELTAMIVIR PHOSPHATE 75 MG PO CAPS: 75.0000 mg | ORAL_CAPSULE | Freq: Two times a day (BID) | ORAL | Status: DC

## 2013-12-31 MED ORDER — HYDROCOD POLST-CHLORPHEN POLST 10-8 MG/5ML PO LQCR: 5.0000 mL | Freq: Two times a day (BID) | ORAL | Status: DC | PRN

## 2013-12-31 NOTE — Progress Notes (Signed)
[facility] [address], [place] [address] [phone]  Date:  12/31/2013   Name:  John Wagner   DOB:  1951/02/02   MRN:  [id number]  PCP:  No primary provider on file.    Chief Complaint: Sore Throat, Cough and Chills   History of Present Illness:  John Wagner is a 63 y.o. very pleasant male patient who presents with the following:  Ill with nasal congestion, sore throat, post nasal drip and an occasional cough.  Scant mucopurulent sputum.  Ill since Wednesday.  Has chills and myalgias.  No wheezing or shortness of breath. No improvement with over the counter medications or other home remedies. Denies other complaint or health concern today.   Patient Active Problem List   Diagnosis Date Noted  . Anxiety 12/06/2012  . Psoriasis 12/06/2012  . Hypertension   . Hyperlipidemia     Past Medical History  Diagnosis Date  . Hypertension   . Hyperlipidemia   . Psoriasis   . Anxiety     Past Surgical History  Procedure Laterality Date  . Appendectomy      History  Substance Use Topics  . Smoking status: Former Research scientist (life sciences)  . Smokeless tobacco: Not on file  . Alcohol Use: Yes    Family History  Problem Relation Age of Onset  . Cancer Father   . Hypertension Mother     Allergies  Allergen Reactions  . Buspar [Buspirone]     CNS  . Statins     myalgias    Medication list has been reviewed and updated.  Current Outpatient Prescriptions on File Prior to Visit  Medication Sig Dispense Refill  . acyclovir (ZOVIRAX) 400 MG tablet take 1 tablet by mouth three times a day for 5 days if needed for OUTBREAK  15 tablet  0  . ALPRAZolam (XANAX) 1 MG tablet Take 1 tablet (1 mg total) by mouth 2 (two) times daily as needed for sleep or anxiety.  60 tablet  3  . ALPRAZolam (XANAX) 1 MG tablet take 1 tablet by mouth twice a day if needed for anxiety or sleep  60 tablet  3  . colestipol (COLESTID) 5 G granules Take 5 g by mouth 2 (two) times daily.   500 g  12  . cyclobenzaprine (FLEXERIL) 10 MG tablet take 1 tablet by mouth twice a day if needed  60 tablet  3  . lisinopril (PRINIVIL,ZESTRIL) 5 MG tablet Take 1 tablet (5 mg total) by mouth daily.  90 tablet  3  . lisinopril (PRINIVIL,ZESTRIL) 5 MG tablet take 1 tablet by mouth once daily  90 tablet  3  . lisinopril (PRINIVIL,ZESTRIL) 5 MG tablet Take 1 tablet (5 mg total) by mouth daily.  90 tablet  3   No current facility-administered medications on file prior to visit.    Review of Systems:  As per HPI, otherwise negative.    Physical Examination: Filed Vitals:   12/31/13 1244  BP: 118/94  Pulse: 83  Temp: 98.3 F (36.8 C)  Resp: 16   Filed Vitals:   12/31/13 1244  Height: 5\' 6"  (1.676 m)  Weight: 182 lb (82.555 kg)   Body mass index is 29.39 kg/(m^2). Ideal Body Weight: Weight in (lb) to have BMI = 25: 154.6  GEN: WDWN, NAD, Non-toxic, A & O x 3 HEENT: Atraumatic, Normocephalic. Neck supple. No masses, No LAD. Ears and Nose: No external deformity. CV: RRR, No M/G/R. No JVD. No  thrill. No extra heart sounds. PULM: CTA B, no wheezes, crackles, rhonchi. No retractions. No resp. distress. No accessory muscle use. ABD: S, NT, ND, +BS. No rebound. No HSM. EXTR: No c/c/e NEURO Normal gait.  PSYCH: Normally interactive. Conversant. Not depressed or anxious appearing.  Calm demeanor.    Assessment and Plan: Influenza tamiflu mucinex d tussionex  Signed,  Ellison Carwin, MD

## 2013-12-31 NOTE — Patient Instructions (Signed)

## 2014-02-08 ENCOUNTER — Other Ambulatory Visit: Payer: Self-pay | Admitting: Physician Assistant

## 2014-02-09 NOTE — Telephone Encounter (Signed)
Dr Elder Cyphers, do you want to RF?

## 2014-03-10 ENCOUNTER — Telehealth: Payer: Self-pay

## 2014-03-10 ENCOUNTER — Ambulatory Visit (INDEPENDENT_AMBULATORY_CARE_PROVIDER_SITE_OTHER): Payer: BC Managed Care – PPO | Admitting: Emergency Medicine

## 2014-03-10 VITALS — BP 140/90 | HR 80 | Temp 97.7°F | Resp 16 | Ht 66.5 in | Wt 187.4 lb

## 2014-03-10 DIAGNOSIS — S61209A Unspecified open wound of unspecified finger without damage to nail, initial encounter: Secondary | ICD-10-CM

## 2014-03-10 DIAGNOSIS — N529 Male erectile dysfunction, unspecified: Secondary | ICD-10-CM

## 2014-03-10 DIAGNOSIS — S61219A Laceration without foreign body of unspecified finger without damage to nail, initial encounter: Secondary | ICD-10-CM

## 2014-03-10 MED ORDER — VARDENAFIL HCL 20 MG PO TABS: 20.0000 mg | ORAL_TABLET | Freq: Every day | ORAL | Status: DC | PRN

## 2014-03-10 NOTE — Telephone Encounter (Signed)
PATIENT IS CALLING TO LET us KNOW THAT HE WOULD LIKE HIS GENERIC LEVITRA CALLED INTO THE OFF SHORE [facility] 20 MG THERE PHONE NUMBER IS [phone] AND FAX IS [phone] ORDER NUMBER IS [id number] IF ANY QUESTIONS PLEASE CALL PATIENT AT [phone]

## 2014-03-10 NOTE — Progress Notes (Signed)
[facility] [address], [place] [address] [phone]  Date:  03/10/2014   Name:  John Wagner   DOB:  1951-03-11   MRN:  [id number]  PCP:  No primary provider on file.    Chief Complaint: Laceration and Medication Refill   History of Present Illness:  John Wagner is a 63 y.o. very pleasant male patient who presents with the following:  Pinched the tip of his finger in a hydraulic framer.  Just caught the tip.  Current on TD. Requests a refill on his levitra.  No improvement with over the counter medications or other home remedies.  Denies other complaint or health concern today.   Patient Active Problem List   Diagnosis Date Noted  . Erectile dysfunction 03/10/2014  . Anxiety 12/06/2012  . Psoriasis 12/06/2012  . Hypertension   . Hyperlipidemia     Past Medical History  Diagnosis Date  . Hypertension   . Hyperlipidemia   . Psoriasis   . Anxiety     Past Surgical History  Procedure Laterality Date  . Appendectomy      History  Substance Use Topics  . Smoking status: Former Research scientist (life sciences)  . Smokeless tobacco: Not on file  . Alcohol Use: Yes    Family History  Problem Relation Age of Onset  . Cancer Father   . Hypertension Mother     Allergies  Allergen Reactions  . Buspar [Buspirone]     CNS  . Statins     myalgias    Medication list has been reviewed and updated.  Current Outpatient Prescriptions on File Prior to Visit  Medication Sig Dispense Refill  . acyclovir (ZOVIRAX) 400 MG tablet take 1 tablet by mouth three times a day for 5 days if needed for OUTBREAK  15 tablet  0  . ALPRAZolam (XANAX) 1 MG tablet Take 1 tablet (1 mg total) by mouth 2 (two) times daily as needed for sleep or anxiety.  60 tablet  3  . ALPRAZolam (XANAX) 1 MG tablet take 1 tablet by mouth twice a day if needed for anxiety or sleep  60 tablet  3  . colestipol (COLESTID) 5 G granules Take 5 g by mouth 2 (two) times daily.  500 g  12  . cyclobenzaprine  (FLEXERIL) 10 MG tablet take 1 tablet by mouth twice a day if needed  60 tablet  3  . lisinopril (PRINIVIL,ZESTRIL) 5 MG tablet Take 1 tablet (5 mg total) by mouth daily.  90 tablet  3   No current facility-administered medications on file prior to visit.    Review of Systems:  As per HPI, otherwise negative.    Physical Examination: Filed Vitals:   03/10/14 1134  BP: 140/90  Pulse: 80  Temp: 97.7 F (36.5 C)  Resp: 16   Filed Vitals:   03/10/14 1134  Height: 5' 6.5" (1.689 m)  Weight: 187 lb 6.4 oz (85.004 kg)   Body mass index is 29.8 kg/(m^2). Ideal Body Weight: Weight in (lb) to have BMI = 25: 156.9   GEN: WDWN, NAD, Non-toxic, Alert & Oriented x 3 HEENT: Atraumatic, Normocephalic.  Ears and Nose: No external deformity. EXTR: No clubbing/cyanosis/edema NEURO: Normal gait.  PSYCH: Normally interactive. Conversant. Not depressed or anxious appearing.  Calm demeanor.  Meat slicer injury to the left 2nd finger.  NATI. Very superficial   Assessment and Plan: Laceration second finger ED Refill levitra   Signed,  Ellison Carwin, MD

## 2014-03-10 NOTE — Telephone Encounter (Signed)
Spoke to pt advised to RTC- last time med was filled was 2011.

## 2014-03-10 NOTE — Patient Instructions (Signed)

## 2014-04-24 ENCOUNTER — Other Ambulatory Visit: Payer: Self-pay | Admitting: Internal Medicine

## 2014-06-12 ENCOUNTER — Encounter: Payer: Self-pay | Admitting: Family Medicine

## 2014-06-12 ENCOUNTER — Ambulatory Visit (INDEPENDENT_AMBULATORY_CARE_PROVIDER_SITE_OTHER): Payer: BC Managed Care – PPO | Admitting: Family Medicine

## 2014-06-12 VITALS — BP 130/82 | HR 68 | Resp 18 | Ht 66.5 in | Wt 179.0 lb

## 2014-06-12 DIAGNOSIS — G4700 Insomnia, unspecified: Secondary | ICD-10-CM

## 2014-06-12 DIAGNOSIS — F411 Generalized anxiety disorder: Secondary | ICD-10-CM

## 2014-06-12 DIAGNOSIS — R972 Elevated prostate specific antigen [PSA]: Secondary | ICD-10-CM

## 2014-06-12 DIAGNOSIS — E785 Hyperlipidemia, unspecified: Secondary | ICD-10-CM

## 2014-06-12 DIAGNOSIS — Z79899 Other long term (current) drug therapy: Secondary | ICD-10-CM

## 2014-06-12 DIAGNOSIS — I10 Essential (primary) hypertension: Secondary | ICD-10-CM

## 2014-06-12 LAB — LIPID PANEL
Cholesterol: 226 mg/dL — ABNORMAL HIGH (ref 0–200)
HDL: 44 mg/dL (ref >39)
LDL CALC: 160 mg/dL — AB (ref 0–99)
Total CHOL/HDL Ratio: 5.1 Ratio
Triglycerides: 110 mg/dL (ref <150)
VLDL: 22 mg/dL (ref 0–40)

## 2014-06-12 LAB — BASIC METABOLIC PANEL
BUN: 14 mg/dL (ref 6–23)
CHLORIDE: 107 meq/L (ref 96–112)
CO2: 23 mEq/L (ref 19–32)
CREATININE: 1 mg/dL (ref 0.50–1.35)
Calcium: 8.7 mg/dL (ref 8.4–10.5)
GLUCOSE: 107 mg/dL — AB (ref 70–99)
Potassium: 4.4 mEq/L (ref 3.5–5.3)
Sodium: 139 mEq/L (ref 135–145)

## 2014-06-12 MED ORDER — ALPRAZOLAM 1 MG PO TABS: ORAL_TABLET | ORAL | Status: DC

## 2014-06-12 NOTE — Progress Notes (Signed)
[facility] [address] [place] [address] [phone]  Date:  06/12/2014   Name:  John Wagner   DOB:  10-11-1951   MRN:  [id number]  PCP:  No primary provider on file.    Chief Complaint: Medication Refill and Hemorrhoids   History of Present Illness:  John Wagner is a 63 y.o. very pleasant male patient who presents with the following:  Here today for medication refill. Last labs in December 2014. 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  He also may use a 1/2 or 1 most nights prior to sleep as well.    He has noted hemorrhoids for "may years."  May cause some bleeding, but no itching or pain.  He usally notes this issue when he has a BM.  His most recent colonoscopy was about 10 years ago per [place].  He thinks he has had 2 so far. He would like to see GI regarding this issue to see if anything else can be done. h  He did have high cholesterol about 6 months ago but had muscle pains with statin- this resolved when he stopped the statin.   He is fasting this am.  He has lost some weight.  He did take welchol for a time but did not keep it up  Wt Readings from Last 3 Encounters:  06/12/14 179 lb (81.194 kg)  03/10/14 187 lb 6.4 oz (85.004 kg)  12/31/13 182 lb (82.555 kg)   He is a non- smoker, is a framer (of art and pictures).    Patient Active Problem List   Diagnosis Date Noted  . Erectile dysfunction 03/10/2014  . Anxiety 12/06/2012  . Psoriasis 12/06/2012  . Hypertension   . Hyperlipidemia     Past Medical History  Diagnosis Date  . Hypertension   . Hyperlipidemia   . Psoriasis   . Anxiety     Past Surgical History  Procedure Laterality Date  . Appendectomy      History  Substance Use Topics  . Smoking status: Former Research scientist (life sciences)  . Smokeless tobacco: Not on file  . Alcohol Use: Yes    Family History  Problem Relation Age of Onset  . Cancer Father   . Hypertension Mother     Allergies  Allergen  Reactions  . Buspar [Buspirone]     CNS  . Statins     myalgias    Medication list has been reviewed and updated.  Current Outpatient Prescriptions on File Prior to Visit  Medication Sig Dispense Refill  . acyclovir (ZOVIRAX) 400 MG tablet take 1 tablet by mouth three times a day for 5 days if needed for OUTBREAK  15 tablet  0  . ALPRAZolam (XANAX) 1 MG tablet take 1 tablet by mouth twice a day if needed for anxiety or sleep  60 tablet  3  . colestipol (COLESTID) 5 G granules Take 5 g by mouth 2 (two) times daily.  500 g  12  . cyclobenzaprine (FLEXERIL) 10 MG tablet take 1 tablet by mouth twice a day if needed  60 tablet  3  . lisinopril (PRINIVIL,ZESTRIL) 5 MG tablet Take 1 tablet (5 mg total) by mouth daily.  90 tablet  3  . vardenafil (LEVITRA) 20 MG tablet Take 1 tablet (20 mg total) by mouth daily as needed for erectile dysfunction.  95 tablet  0   No current facility-administered medications on file prior to visit.  Review of Systems:  As per HPI- otherwise negative.   Physical Examination: Filed Vitals:   06/12/14 0811  BP: 130/82  Pulse: 68  Resp: 18   Filed Vitals:   06/12/14 0811  Height: 5' 6.5" (1.689 m)  Weight: 179 lb (81.194 kg)   Body mass index is 28.46 kg/(m^2). Ideal Body Weight: Weight in (lb) to have BMI = 25: 156.9  GEN: WDWN, NAD, Non-toxic, A & O x 3 HEENT: Atraumatic, Normocephalic. Neck supple. No masses, No LAD. Ears and Nose: No external deformity. CV: RRR, No M/G/R. No JVD. No thrill. No extra heart sounds. PULM: CTA B, no wheezes, crackles, rhonchi. No retractions. No resp. distress. No accessory muscle use. ABD: S, NT, ND, +BS. No rebound. No HSM. EXTR: No c/c/e NEURO Normal gait.  PSYCH: Normally interactive. Conversant. Not depressed or anxious appearing.  Calm demeanor.  Rectal:he has mild external hemorrhoids and some irritation. No bleeding noted   Assessment and Plan: Unspecified essential hypertension - Plan: Basic  metabolic panel  Other and unspecified hyperlipidemia - Plan: Lipid panel  Encounter for long-term (current) use of other medications  Elevated PSA - Plan: PSA  Insomnia - Plan: ALPRAZolam (XANAX) 1 MG tablet  Anxiety state, unspecified - Plan: ALPRAZolam (XANAX) 1 MG tablet  BP is controlled, check BMP He did take welchol perhaps for a little while but did not continue with this. Await FLP, he has lost weight His urologist is Dr. Consuella Lose- he reports that his PSA has been borderline and they are keeping an eye on it.  Refilled his xanax today He will call [facility] regarding his hemorrhoids. If anything further is needed on my end he will let me know.    Meds ordered this encounter  Medications  . ALPRAZolam (XANAX) 1 MG tablet    Sig: Take 1/2 or 1 prior to performance and as needed for sleep    Dispense:  60 tablet    Refill:  3     Signed Lamar Blinks, MD

## 2014-06-12 NOTE — Patient Instructions (Signed)
Please give your doctor at [facility] a call about your hemorrhoids.  There most likely is a treatment option available for you.   [facility] [address] [place] [address] Phone: [phone]  I will be in touch with your labs- will send them to you on mychart.  I noticed that your PSA has been a little bit high- will check this for you today to see how it is trending. Your BP looks fine.  Congratulations on your weight loss!

## 2014-06-13 LAB — PSA: PSA: 7.04 ng/mL — ABNORMAL HIGH (ref <=4.00)

## 2014-06-15 ENCOUNTER — Encounter: Payer: Self-pay | Admitting: Family Medicine

## 2014-06-30 ENCOUNTER — Other Ambulatory Visit: Payer: Self-pay | Admitting: Internal Medicine

## 2014-07-04 NOTE — Telephone Encounter (Signed)
Ok to refill for 3 months.  

## 2014-07-05 NOTE — Telephone Encounter (Signed)
Phoned in.

## 2014-07-18 ENCOUNTER — Telehealth: Payer: Self-pay

## 2014-07-18 DIAGNOSIS — K648 Other hemorrhoids: Secondary | ICD-10-CM

## 2014-07-18 NOTE — Telephone Encounter (Signed)
Pt is needing to talk with dr copland about referring him to [facility] for hemorrids

## 2014-07-19 NOTE — Telephone Encounter (Signed)
Attempted call, unable to lv msg.

## 2014-07-20 NOTE — Telephone Encounter (Signed)
Called him back- GI would like him to see [facility] for this.  Made referral for him

## 2014-07-27 ENCOUNTER — Other Ambulatory Visit: Payer: Self-pay | Admitting: Internal Medicine

## 2014-08-07 ENCOUNTER — Encounter (INDEPENDENT_AMBULATORY_CARE_PROVIDER_SITE_OTHER): Payer: Self-pay | Admitting: General Surgery

## 2014-08-07 ENCOUNTER — Ambulatory Visit (INDEPENDENT_AMBULATORY_CARE_PROVIDER_SITE_OTHER): Payer: BC Managed Care – PPO | Admitting: General Surgery

## 2014-08-07 VITALS — BP 118/80 | HR 66 | Temp 97.6°F | Resp 16 | Ht 66.0 in | Wt 181.2 lb

## 2014-08-07 DIAGNOSIS — IMO0001 Reserved for inherently not codable concepts without codable children: Secondary | ICD-10-CM

## 2014-08-07 DIAGNOSIS — K648 Other hemorrhoids: Secondary | ICD-10-CM

## 2014-08-07 MED ORDER — HYDROCORTISONE ACETATE 25 MG RE SUPP: 25.0000 mg | Freq: Every day | RECTAL | Status: DC

## 2014-08-07 NOTE — Patient Instructions (Signed)

## 2014-08-07 NOTE — Progress Notes (Signed)
No chief complaint on file.   HISTORY: John Wagner. is a 63 y.o. male who presents to the office with "hemorrhoids".  Other symptoms include bleeding, pain and irritation.  This had been occurring for many years.  he has tried sitz baths, HC cream in the past with some success.  Sress makes the symptoms worse.   intermittent in nature.  his bowel habits are regular and his bowel movements are usually soft.  his fiber intake is dietary and through a supplement (2 times a week).  his last colonoscopy was about 4-5 yrs ago.  He denies prolapsing tissue.  He has a history of psoriasis and occasionally has outbreaks in his more sensitive areas of skin. He controls this by using intermittent betamethasone cream.  Past Medical History  Diagnosis Date  . Hypertension   . Hyperlipidemia   . Psoriasis   . Anxiety       Past Surgical History  Procedure Laterality Date  . Appendectomy          Current Outpatient Prescriptions  Medication Sig Dispense Refill  . acyclovir (ZOVIRAX) 400 MG tablet take 1 tablet by mouth three times a day for 5 days if needed for OUTBREAK  15 tablet  0  . ALPRAZolam (XANAX) 1 MG tablet take 1 tablet by mouth twice a day if needed for anxiety sleep  60 tablet  2  . colestipol (COLESTID) 5 G granules Take 5 g by mouth 2 (two) times daily.  500 g  12  . cyclobenzaprine (FLEXERIL) 10 MG tablet take 1 tablet by mouth twice a day if needed  60 tablet  3  . hydrocortisone (ANUSOL-HC) 25 MG suppository Place 1 suppository (25 mg total) rectally daily.  12 suppository  0  . lisinopril (PRINIVIL,ZESTRIL) 5 MG tablet Take 1 tablet (5 mg total) by mouth daily.  90 tablet  3  . lisinopril (PRINIVIL,ZESTRIL) 5 MG tablet take 1 tablet by mouth once daily  90 tablet  1  . vardenafil (LEVITRA) 20 MG tablet Take 1 tablet (20 mg total) by mouth daily as needed for erectile dysfunction.  95 tablet  0   No current facility-administered medications for this visit.      Allergies   Allergen Reactions  . Buspar [Buspirone]     CNS  . Statins     myalgias      Family History  Problem Relation Age of Onset  . Cancer Father   . Hypertension Mother     History   Social History  . Marital Status: Single    Spouse Name: N/A    Number of Children: N/A  . Years of Education: N/A   Occupational History  . picture frame/artist    Social History Main Topics  . Smoking status: Former Research scientist (life sciences)  . Smokeless tobacco: None  . Alcohol Use: Yes  . Drug Use: No  . Sexual Activity: None   Other Topics Concern  . None   Social History Narrative  . None      REVIEW OF SYSTEMS - PERTINENT POSITIVES ONLY: Review of Systems - General ROS: negative for - chills, fever or weight loss Hematological and Lymphatic ROS: negative for - bleeding problems, blood clots or bruising Respiratory ROS: no cough, shortness of breath, or wheezing Cardiovascular ROS: no chest pain or dyspnea on exertion Gastrointestinal ROS: no abdominal pain, change in bowel habits, or black or bloody stools Genito-Urinary ROS: no dysuria, trouble voiding, or hematuria  EXAM: Filed Vitals:  08/07/14 1347  BP: 118/80  Pulse: 66  Temp: 97.6 F (36.4 C)  Resp: 16    General appearance: alert and cooperative Resp: clear to auscultation bilaterally Cardio: regular rate and rhythm GI: soft, non-tender; bowel sounds normal; no masses,  no organomegaly  Procedure: Anoscopy Surgeon: Marcello Moores Diagnosis: rectal bleeding  Assistant: Patria Mane After the risks and benefits were explained, verbal consent was obtained for above procedure  Anesthesia: none Findings: significant perianal inflammation, small R posterior skin tag, grade 1 internal hemorrhoids with significant inflammation    ASSESSMENT AND PLAN: John Wagner. is a 63 y.o. male with grade 1 internal hemorrhoids that are significantly inflamed. I have recommended that he try Anusol suppository for several days after inflammation  begins. He can then repeat this as needed. I will see him back in the office in a couple months for reevaluation.  Continue when necessary betamethasone treatment for perianal psoriasis. Patient understands to use this intermittently.    Rosario Adie, MD Colon and Rectal Surgery / [facility]      Visit Diagnoses: 1. Prolapsed internal hemorrhoids, grade 1     Primary Care Physician: Lamar Blinks, MD

## 2014-08-28 ENCOUNTER — Other Ambulatory Visit: Payer: Self-pay | Admitting: Physician Assistant

## 2014-08-29 NOTE — Telephone Encounter (Signed)
Called him- he reports that he uses the flexeril just on occasion and he does not need any more.  Reports that he has more than enough at home already.  Declined to RF

## 2014-08-29 NOTE — Telephone Encounter (Signed)
Dr Lorelei Pont, pt had CPE w/you in June, but do not see this med addressed. Dr Elder Cyphers had been Rxing for pt. He has another appt sch for 12/25/14. Do you want to RF or see pt for Rx?

## 2014-09-17 ENCOUNTER — Other Ambulatory Visit: Payer: Self-pay | Admitting: Internal Medicine

## 2014-10-09 ENCOUNTER — Encounter (INDEPENDENT_AMBULATORY_CARE_PROVIDER_SITE_OTHER): Payer: BC Managed Care – PPO | Admitting: General Surgery

## 2014-12-25 ENCOUNTER — Encounter: Payer: Self-pay | Admitting: Family Medicine

## 2014-12-25 ENCOUNTER — Ambulatory Visit (INDEPENDENT_AMBULATORY_CARE_PROVIDER_SITE_OTHER): Payer: BC Managed Care – PPO | Admitting: Family Medicine

## 2014-12-25 VITALS — BP 120/60 | HR 66 | Temp 97.6°F | Resp 16 | Ht 66.0 in | Wt 179.0 lb

## 2014-12-25 DIAGNOSIS — E785 Hyperlipidemia, unspecified: Secondary | ICD-10-CM

## 2014-12-25 DIAGNOSIS — Z130 Encounter for screening for diseases of the blood and blood-forming organs and certain disorders involving the immune mechanism: Secondary | ICD-10-CM

## 2014-12-25 DIAGNOSIS — F411 Generalized anxiety disorder: Secondary | ICD-10-CM

## 2014-12-25 DIAGNOSIS — I10 Essential (primary) hypertension: Secondary | ICD-10-CM

## 2014-12-25 DIAGNOSIS — Z1159 Encounter for screening for other viral diseases: Secondary | ICD-10-CM

## 2014-12-25 DIAGNOSIS — Z131 Encounter for screening for diabetes mellitus: Secondary | ICD-10-CM

## 2014-12-25 DIAGNOSIS — Z0000 Encounter for general adult medical examination without abnormal findings: Secondary | ICD-10-CM

## 2014-12-25 DIAGNOSIS — N400 Enlarged prostate without lower urinary tract symptoms: Secondary | ICD-10-CM

## 2014-12-25 LAB — CBC
HCT: 44.6 % (ref 39.0–52.0)
Hemoglobin: 15.2 g/dL (ref 13.0–17.0)
MCH: 29.1 pg (ref 26.0–34.0)
MCHC: 34.1 g/dL (ref 30.0–36.0)
MCV: 85.4 fL (ref 78.0–100.0)
MPV: 10.1 fL (ref 8.6–12.4)
PLATELETS: 371 10*3/uL (ref 150–400)
RBC: 5.22 MIL/uL (ref 4.22–5.81)
RDW: 14.5 % (ref 11.5–15.5)
WBC: 5.6 10*3/uL (ref 4.0–10.5)

## 2014-12-25 LAB — COMPREHENSIVE METABOLIC PANEL
ALT: 27 U/L (ref 0–53)
AST: 25 U/L (ref 0–37)
Albumin: 4.3 g/dL (ref 3.5–5.2)
Alkaline Phosphatase: 46 U/L (ref 39–117)
BUN: 13 mg/dL (ref 6–23)
CALCIUM: 9.3 mg/dL (ref 8.4–10.5)
CHLORIDE: 102 meq/L (ref 96–112)
CO2: 30 meq/L (ref 19–32)
Creat: 0.95 mg/dL (ref 0.50–1.35)
Glucose, Bld: 101 mg/dL — ABNORMAL HIGH (ref 70–99)
POTASSIUM: 4.1 meq/L (ref 3.5–5.3)
Sodium: 139 mEq/L (ref 135–145)
Total Bilirubin: 0.8 mg/dL (ref 0.2–1.2)
Total Protein: 6.7 g/dL (ref 6.0–8.3)

## 2014-12-25 LAB — HEPATITIS C ANTIBODY: HCV Ab: NEGATIVE

## 2014-12-25 LAB — LIPID PANEL
Cholesterol: 300 mg/dL — ABNORMAL HIGH (ref 0–200)
HDL: 60 mg/dL (ref >39)
LDL Cholesterol: 199 mg/dL — ABNORMAL HIGH (ref 0–99)
Total CHOL/HDL Ratio: 5 Ratio
Triglycerides: 205 mg/dL — ABNORMAL HIGH (ref <150)
VLDL: 41 mg/dL — ABNORMAL HIGH (ref 0–40)

## 2014-12-25 MED ORDER — LISINOPRIL 5 MG PO TABS: 5.0000 mg | ORAL_TABLET | Freq: Every day | ORAL | Status: DC

## 2014-12-25 MED ORDER — ALPRAZOLAM 1 MG PO TABS: ORAL_TABLET | ORAL | Status: DC

## 2014-12-25 NOTE — Progress Notes (Addendum)
[facility] [address],   [address] [phone]  Date:  12/25/2014   Name:  John Wagner.   DOB:  Nov 09, 1951   MRN:  [id number]  PCP:  Lamar Blinks, MD    Chief Complaint: Annual Exam   History of Present Illness:  John Wagner. is a 64 y.o. very pleasant male patient who presents with the following:  Here today for a CPE.  He is doing well overall.  He stopped his statin about a year ago due to some sx.   He is fasting today for labs.  He is taking 5mg  of lisinopril daily.   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   He uses xanax prior to performance and for sleep some of the time. He was seen by [facility] for his hemorrhoids- he has suppositiries, they did not operate.    He is a former smoker.   He is UTD on his tetanus, he declines a flu shot.   He has not yet has the shingles vaccine.   Patient Active Problem List   Diagnosis Date Noted  . Erectile dysfunction 03/10/2014  . Anxiety 12/06/2012  . Psoriasis 12/06/2012  . Hypertension   . Hyperlipidemia     Past Medical History  Diagnosis Date  . Hypertension   . Hyperlipidemia   . Psoriasis   . Anxiety     Past Surgical History  Procedure Laterality Date  . Appendectomy      History  Substance Use Topics  . Smoking status: Former Research scientist (life sciences)  . Smokeless tobacco: Not on file  . Alcohol Use: Yes    Family History  Problem Relation Age of Onset  . Cancer Father     prostate cancer  . Hypertension Mother   . Heart disease Mother     Allergies  Allergen Reactions  . Buspar [Buspirone]     CNS  . Statins     myalgias    Medication list has been reviewed and updated.  Current Outpatient Prescriptions on File Prior to Visit  Medication Sig Dispense Refill  . acyclovir (ZOVIRAX) 400 MG tablet take 1 tablet by mouth three times a day for 5 days for OUTBREAKS 15 tablet 0  . ALPRAZolam (XANAX) 1 MG tablet take 1 tablet by mouth twice a day if  needed for anxiety sleep 60 tablet 2  . cyclobenzaprine (FLEXERIL) 10 MG tablet take 1 tablet by mouth twice a day if needed 60 tablet 3  . hydrocortisone (ANUSOL-HC) 25 MG suppository Place 1 suppository (25 mg total) rectally daily. 12 suppository 0  . lisinopril (PRINIVIL,ZESTRIL) 5 MG tablet take 1 tablet by mouth once daily 90 tablet 1  . vardenafil (LEVITRA) 20 MG tablet Take 1 tablet (20 mg total) by mouth daily as needed for erectile dysfunction. 95 tablet 0   No current facility-administered medications on file prior to visit.    Review of Systems:  As per HPI- otherwise negative.   Physical Examination: Filed Vitals:   12/25/14 0828  BP: 142/93  Pulse: 66  Temp: 97.6 F (36.4 C)  Resp: 16   Filed Vitals:   12/25/14 0828  Height: 5\' 6"  (1.676 m)  Weight: 179 lb (81.194 kg)   Body mass index is 28.91 kg/(m^2). Ideal Body Weight: Weight in (lb) to have BMI = 25: 154.6  GEN: WDWN, NAD, Non-toxic, A & O x 3, overweight, looks well HEENT: Atraumatic, Normocephalic. Neck supple. No masses, No LAD.  Bilateral TM wnl, oropharynx normal.  PEERL,EOMI.   Ears and Nose: No external deformity. CV: RRR, No M/G/R. No JVD. No thrill. No extra heart sounds. PULM: CTA B, no wheezes, crackles, rhonchi. No retractions. No resp. distress. No accessory muscle use. ABD: S, NT, ND, +BS. No rebound. No HSM. EXTR: No c/c/e NEURO Normal gait.  PSYCH: Normally interactive. Conversant. Not depressed or anxious appearing.  Calm demeanor.  Defer GU exam as he is followed by urology  Assessment and Plan: Physical exam  BPH (benign prostatic hyperplasia)  Essential hypertension - Plan: lisinopril (PRINIVIL,ZESTRIL) 5 MG tablet  Anxiety state - Plan: ALPRAZolam (XANAX) 1 MG tablet  Need for hepatitis C screening test - Plan: Hepatitis C antibody  Hyperlipidemia - Plan: Lipid panel  Screening for diabetes mellitus - Plan: Comprehensive metabolic panel  Screening for deficiency anemia -  Plan: CBC  CPE today.  He declines a flu shot.  Given rx for zostavax He has been off his statin due to SE- await FLP today Will plan further follow- up pending labs.   Signed Lamar Blinks, MD

## 2014-12-25 NOTE — Patient Instructions (Signed)
Good to see you today as always.  I will be in touch with your labs asap Get the shingles vaccine (zostavax) at your convenience- this is a one time immunization  You may be coming due for your colonoscopy.   [facility] Address: [address], [place], [place] [address]  [phone] Give them a call and find out when you are due/ schedule your next test  Take care!

## 2014-12-26 ENCOUNTER — Encounter: Payer: Self-pay | Admitting: Family Medicine

## 2014-12-28 ENCOUNTER — Other Ambulatory Visit: Payer: Self-pay | Admitting: Family Medicine

## 2015-01-01 ENCOUNTER — Other Ambulatory Visit: Payer: Self-pay | Admitting: Family Medicine

## 2015-05-29 ENCOUNTER — Other Ambulatory Visit: Payer: Self-pay | Admitting: Family Medicine

## 2015-07-17 ENCOUNTER — Telehealth: Payer: Self-pay

## 2015-07-17 NOTE — Telephone Encounter (Signed)
Sent to medical records in error. Will forward to clinical staff.

## 2015-07-17 NOTE — Telephone Encounter (Signed)
ALPRAZolam (XANAX) 1 MG tablet REFILL  Also, wants to talk with Dr. Lorelei Pont regarding his pain.    [phone]

## 2015-07-18 MED ORDER — PROPRANOLOL HCL 10 MG PO TABS: ORAL_TABLET | ORAL | Status: DC

## 2015-07-18 MED ORDER — ALPRAZOLAM 1 MG PO TABS: ORAL_TABLET | ORAL | Status: DC

## 2015-07-18 NOTE — Telephone Encounter (Signed)
He states that he is taking a lot of ibuprofen for pain in his knees and legs, and also his lower back.  He thinks this is related to spending a lot of time standing on hard floors.  Counseled him that he can also add tylenol as needed for pain according to package directions Advised moderate weight loss for his joints  He takes xanax at bedtime every day; he knows this is not ideal but is not sure how to stop.  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  He is interested in trying a beta blocker for this.  Counseled him to try tapering his xanax dose to a half tablet at bedtime, with the goal of stopping rx inderal to use per his musical performances. He will let me know how this works for him  Meds ordered this encounter  Medications  . propranolol (INDERAL) 10 MG tablet    Sig: Use twice a day as needed for performance anxiety    Dispense:  30 tablet    Refill:  1  . ALPRAZolam (XANAX) 1 MG tablet    Sig: take 1 tablet by mouth at bedtime as needed for sleep    Dispense:  45 tablet    Refill:  0

## 2015-07-19 ENCOUNTER — Telehealth: Payer: Self-pay

## 2015-07-19 NOTE — Telephone Encounter (Signed)
Pt wants Dr. Lorelei Pont to know he really needs to stay on his ALPRAZolam Duanne Moron) 1 MG tablet [[id number]]. It has taken 25 years of therapy to realize he can't take SSRI's. He would like you to talk with Dr. Elder Cyphers about his situation. He wants you to know he thinks you are wonderful. He is planning on write you a letter explaining his situation.

## 2015-07-23 NOTE — Telephone Encounter (Signed)
noted 

## 2015-08-29 ENCOUNTER — Encounter: Payer: Self-pay | Admitting: Family Medicine

## 2015-08-29 ENCOUNTER — Ambulatory Visit (INDEPENDENT_AMBULATORY_CARE_PROVIDER_SITE_OTHER): Payer: 59 | Admitting: Family Medicine

## 2015-08-29 VITALS — BP 118/81 | HR 76 | Temp 98.1°F | Resp 16 | Ht 66.0 in | Wt 172.8 lb

## 2015-08-29 DIAGNOSIS — M25569 Pain in unspecified knee: Secondary | ICD-10-CM

## 2015-08-29 DIAGNOSIS — G8929 Other chronic pain: Secondary | ICD-10-CM | POA: Diagnosis not present

## 2015-08-29 DIAGNOSIS — L409 Psoriasis, unspecified: Secondary | ICD-10-CM | POA: Diagnosis not present

## 2015-08-29 DIAGNOSIS — F411 Generalized anxiety disorder: Secondary | ICD-10-CM | POA: Diagnosis not present

## 2015-08-29 DIAGNOSIS — E785 Hyperlipidemia, unspecified: Secondary | ICD-10-CM

## 2015-08-29 LAB — LIPID PANEL
Cholesterol: 225 mg/dL — ABNORMAL HIGH (ref 125–200)
HDL: 45 mg/dL (ref >=40)
LDL Cholesterol: 156 mg/dL — ABNORMAL HIGH (ref <130)
Total CHOL/HDL Ratio: 5 Ratio (ref <=5.0)
Triglycerides: 122 mg/dL (ref <150)
VLDL: 24 mg/dL (ref <30)

## 2015-08-29 MED ORDER — TRAMADOL HCL 50 MG PO TABS: 50.0000 mg | ORAL_TABLET | Freq: Three times a day (TID) | ORAL | Status: DC | PRN

## 2015-08-29 MED ORDER — ALPRAZOLAM 1 MG PO TABS: ORAL_TABLET | ORAL | Status: DC

## 2015-08-29 NOTE — Progress Notes (Signed)
[facility] [address] [place] [address] [phone]  Date:  08/29/2015   Name:  John Wagner.   DOB:  10-27-1951   MRN:  [id number]  PCP:  Lamar Blinks, MD    Chief Complaint: Hypertension; Hyperlipidemia; and Knee Pain   History of Present Illness:  John Wagner. is a 64 y.o. very pleasant male patient who presents with the following:  Here today for a follow-up visit. History of anxiety, psoriasis, htn, hyperlipidemia.    Last labs in January of this year He has been on xanax as needed for anxiety Refuses flu shot.   He works in a Science writer.  He is on his feet a lot, and tends to have a lot of pain in his legs esp in the colder weather.  He would like to discuss how to treat this as the fall and winter approaches.  He has had this pain for years  He did get some hydrocodone in June from his oral surgeon. Otherwise no opiods on his medication list or database search  He uses ibuprofen up to 800 mg, aleve, tylenol. Not able to tolerate mobic  Wt Readings from Last 3 Encounters:  08/29/15 172 lb 12.8 oz (78.382 kg)  12/25/14 179 lb (81.194 kg)  08/07/14 181 lb 3.2 oz (82.192 kg)   He has lost about 10 lbs in an attempt to help with his knee pain  He is fasting this am except for for a banana   He has tried a few different SSR/ SNRI over the years; he felt that zoloft gave him "5 years of side effects" after he stopped taking it.   In any case, he feels that he cannot take these medications and that xanax is the only thing that can help him with his anxiety.   He has tried inderal also for anxiety before bagpipes performances-this really helped him a lot  He is taking xanax 0.5 around noon, and one qhs.  Rarely may take 1mg  dose   Patient Active Problem List   Diagnosis Date Noted  . BPH (benign prostatic hyperplasia) 12/25/2014  . Erectile dysfunction 03/10/2014  . Anxiety 12/06/2012  . Psoriasis 12/06/2012  . Hypertension   .  Hyperlipidemia     Past Medical History  Diagnosis Date  . Hypertension   . Hyperlipidemia   . Psoriasis   . Anxiety     Past Surgical History  Procedure Laterality Date  . Appendectomy      Social History  Substance Use Topics  . Smoking status: Former Research scientist (life sciences)  . Smokeless tobacco: None  . Alcohol Use: Yes    Family History  Problem Relation Age of Onset  . Cancer Father     prostate cancer  . Hypertension Mother   . Heart disease Mother     Allergies  Allergen Reactions  . Buspar [Buspirone]     CNS  . Statins     myalgias    Medication list has been reviewed and updated.  Current Outpatient Prescriptions on File Prior to Visit  Medication Sig Dispense Refill  . acyclovir (ZOVIRAX) 400 MG tablet take 1 tablet by mouth three times a day for 5 days for OUTBREAKS 15 tablet 0  . ALPRAZolam (XANAX) 1 MG tablet take 1 tablet by mouth at bedtime as needed for sleep 45 tablet 0  . cyclobenzaprine (FLEXERIL) 10 MG tablet take 1 tablet by mouth twice a day if needed 60 tablet 3  .  hydrocortisone (ANUSOL-HC) 25 MG suppository Place 1 suppository (25 mg total) rectally daily. 12 suppository 0  . lisinopril (PRINIVIL,ZESTRIL) 5 MG tablet Take 1 tablet (5 mg total) by mouth daily. 90 tablet 3  . propranolol (INDERAL) 10 MG tablet Use twice a day as needed for performance anxiety 30 tablet 1  . vardenafil (LEVITRA) 20 MG tablet Take 1 tablet (20 mg total) by mouth daily as needed for erectile dysfunction. 95 tablet 0   No current facility-administered medications on file prior to visit.    Review of Systems:  As per HPI- otherwise negative.   Physical Examination: Filed Vitals:   08/29/15 0808  BP: 118/81  Pulse: 76  Temp: 98.1 F (36.7 C)  Resp: 16   Filed Vitals:   08/29/15 0808  Height: 5\' 6"  (1.676 m)  Weight: 172 lb 12.8 oz (78.382 kg)   Body mass index is 27.9 kg/(m^2). Ideal Body Weight: Weight in (lb) to have BMI = 25: 154.6  GEN: WDWN, NAD,  Non-toxic, A & O x 3, mild overweight, looks well- has lost about 10 lbs HEENT: Atraumatic, Normocephalic. Neck supple. No masses, No LAD. Ears and Nose: No external deformity. CV: RRR, No M/G/R. No JVD. No thrill. No extra heart sounds. PULM: CTA B, no wheezes, crackles, rhonchi. No retractions. No resp. distress. No accessory muscle use. EXTR: No c/c/e NEURO Normal gait.  PSYCH: Normally interactive. Conversant. Not depressed or anxious appearing.  Calm demeanor.  Normal ROM of both knees, no crepitus noted    Assessment and Plan: Psoriasis  Dyslipidemia - Plan: Lipid panel  Chronic knee pain, unspecified laterality - Plan: traMADol (ULTRAM) 50 MG tablet  GAD (generalized anxiety disorder) - Plan: ALPRAZolam (XANAX) 1 MG tablet  See patient instructions for more details.   Trial of tramadol for more severe pain days- ok to combine with NSAIDs or tylenol  Signed Lamar Blinks, MD

## 2015-08-29 NOTE — Patient Instructions (Addendum)
Ultram as needed for more severe knee pain- however remember that this is a mild narcotic and can make you feel a bit sleepy We can continue to use the xanax as needed for your anxiety  We will see how your cholesterol is doing now compared with January- hopefully we will see improvement given your weight loss!

## 2015-08-30 ENCOUNTER — Encounter: Payer: Self-pay | Admitting: Family Medicine

## 2015-10-29 ENCOUNTER — Telehealth: Payer: Self-pay

## 2015-10-29 DIAGNOSIS — Z1211 Encounter for screening for malignant neoplasm of colon: Secondary | ICD-10-CM

## 2015-10-29 NOTE — Telephone Encounter (Signed)
Pt would like to have a referral to Teena Irani at [facility] for a [employer]. Please call [phone]. He is Dr Lillie Fragmin pt

## 2015-11-30 LAB — HM COLONOSCOPY

## 2015-12-07 ENCOUNTER — Other Ambulatory Visit: Payer: Self-pay | Admitting: Physician Assistant

## 2015-12-11 ENCOUNTER — Ambulatory Visit (INDEPENDENT_AMBULATORY_CARE_PROVIDER_SITE_OTHER): Payer: 59 | Admitting: Family Medicine

## 2015-12-11 VITALS — BP 142/82 | HR 95 | Temp 99.6°F | Resp 16 | Ht 65.75 in | Wt 184.6 lb

## 2015-12-11 DIAGNOSIS — R509 Fever, unspecified: Secondary | ICD-10-CM | POA: Diagnosis not present

## 2015-12-11 DIAGNOSIS — R52 Pain, unspecified: Secondary | ICD-10-CM

## 2015-12-11 DIAGNOSIS — R5381 Other malaise: Secondary | ICD-10-CM | POA: Diagnosis not present

## 2015-12-11 LAB — POCT INFLUENZA A/B
Influenza A, POC: NEGATIVE
Influenza B, POC: NEGATIVE

## 2015-12-11 LAB — POCT CBC
GRANULOCYTE PERCENT: 81.1 % — AB (ref 37–80)
HCT, POC: 42.3 % — AB (ref 43.5–53.7)
Hemoglobin: 14.4 g/dL (ref 14.1–18.1)
Lymph, poc: 0.7 (ref 0.6–3.4)
MCH, POC: 29.2 pg (ref 27–31.2)
MCHC: 34.2 g/dL (ref 31.8–35.4)
MCV: 85.6 fL (ref 80–97)
MID (cbc): 0.5 (ref 0–0.9)
MPV: 7.2 fL (ref 0–99.8)
PLATELET COUNT, POC: 300 10*3/uL (ref 142–424)
POC Granulocyte: 4.9 (ref 2–6.9)
POC LYMPH PERCENT: 11.2 %L (ref 10–50)
POC MID %: 7.7 %M (ref 0–12)
RBC: 4.94 M/uL (ref 4.69–6.13)
RDW, POC: 13.8 %
WBC: 6 10*3/uL (ref 4.6–10.2)

## 2015-12-11 MED ORDER — OSELTAMIVIR PHOSPHATE 75 MG PO CAPS: 75.0000 mg | ORAL_CAPSULE | Freq: Two times a day (BID) | ORAL | Status: DC

## 2015-12-11 NOTE — Patient Instructions (Signed)
We suspect that you may have the flu Use the tamiflu as directed Rest, drink plenty of fluids, and use ibuprofen and/ or tylenol as needed for your symptoms If you are getting worse or if one symptom in particular comes to the forefront please give me a call!

## 2015-12-11 NOTE — Progress Notes (Signed)
[facility] [address] [place]  [address] [phone]  Date:  12/11/2015   Name:  John Wagner.   DOB:  1951-03-21   MRN:  [id number]  PCP:  Lamar Blinks, MD    Chief Complaint: Chills; Generalized Body Aches; and Nausea   History of Present Illness:  John Diprima. is a 64 y.o. very pleasant male patient who presents with the following:  Here today with illness- he has noted possible flu- "I don't know that else could be wrong with me.  I think I have the flu."  He got sick early yesterday am- went to work because he really needed to but felt awful.  He notes that he aches all over.  He does not have a cough.  He felt nauseated and is belching but has not eaten today, no vomiting,  He is able to take fluids ok No diarrhea.   His stomach does not hurt He does not really have a ST or other URI sx He notes that he just feels tired, achy, and exhausted. No other particular  He took ibuprofen at least 6 hours ago  Patient Active Problem List   Diagnosis Date Noted  . BPH (benign prostatic hyperplasia) 12/25/2014  . Erectile dysfunction 03/10/2014  . Anxiety 12/06/2012  . Psoriasis 12/06/2012  . Hypertension   . Hyperlipidemia     Past Medical History  Diagnosis Date  . Hypertension   . Hyperlipidemia   . Psoriasis   . Anxiety     Past Surgical History  Procedure Laterality Date  . Appendectomy      Social History  Substance Use Topics  . Smoking status: Former Research scientist (life sciences)  . Smokeless tobacco: None  . Alcohol Use: Yes    Family History  Problem Relation Age of Onset  . Cancer Father     prostate cancer  . Hypertension Mother   . Heart disease Mother     Allergies  Allergen Reactions  . Buspar [Buspirone]     CNS  . Statins     myalgias    Medication list has been reviewed and updated.  Current Outpatient Prescriptions on File Prior to Visit  Medication Sig Dispense Refill  . acyclovir (ZOVIRAX) 400 MG tablet TAKE 1  TABLET BY MOUTH THREE TIMES A DAY FOR OUTBREAKS 15 tablet 0  . ALPRAZolam (XANAX) 1 MG tablet Take a 1/2 tablet at noon and a 1/2 at bedtime.  May take a whole tablet if needed on occasion 40 tablet 3  . betamethasone valerate lotion (VALISONE) 0.1 % Apply 1 application topically 2 (two) times daily.    . hydrocortisone (ANUSOL-HC) 25 MG suppository Place 1 suppository (25 mg total) rectally daily. 12 suppository 0  . lisinopril (PRINIVIL,ZESTRIL) 5 MG tablet Take 1 tablet (5 mg total) by mouth daily. 90 tablet 3  . propranolol (INDERAL) 10 MG tablet Use twice a day as needed for performance anxiety 30 tablet 1  . traMADol (ULTRAM) 50 MG tablet Take 1 tablet (50 mg total) by mouth every 8 (eight) hours as needed. 30 tablet 1  . vardenafil (LEVITRA) 20 MG tablet Take 1 tablet (20 mg total) by mouth daily as needed for erectile dysfunction. 95 tablet 0   No current facility-administered medications on file prior to visit.    Review of Systems:  As per HPI- otherwise negative.   Physical Examination: Filed Vitals:   12/11/15 1727  BP: 142/82  Pulse: 95  Temp: 99.6 F (37.6  C)  Resp: 16   Filed Vitals:   12/11/15 1727  Height: 5' 5.75" (1.67 m)  Weight: 184 lb 9.6 oz (83.734 kg)   Body mass index is 30.02 kg/(m^2). Ideal Body Weight: Weight in (lb) to have BMI = 25: 153.4  GEN: WDWN, NAD, Non-toxic, A & O x 3, appears tired and like he does not feel well Neck is supple, no rash is apparent HEENT: Atraumatic, Normocephalic. Neck supple. No masses, No LAD.  Bilateral TM wnl, oropharynx normal.  PEERL,EOMI.   Ears and Nose: No external deformity. CV: RRR, No M/G/R. No JVD. No thrill. No extra heart sounds. PULM: CTA B, no wheezes, crackles, rhonchi. No retractions. No resp. distress. No accessory muscle use. ABD: S, NT, ND. No rebound. No HSM.  Belly benign  EXTR: No c/c/e NEURO Normal gait.  PSYCH: Normally interactive. Conversant. Not depressed or anxious appearing.  Calm  demeanor.   Given 400 mg of ibuprofen in clinic  Results for orders placed or performed in visit on 12/11/15  POCT CBC  Result Value Ref Range   WBC 6.0 4.6 - 10.2 K/uL   Lymph, poc 0.7 0.6 - 3.4   POC LYMPH PERCENT 11.2 10 - 50 %L   MID (cbc) 0.5 0 - 0.9   POC MID % 7.7 0 - 12 %M   POC Granulocyte 4.9 2 - 6.9   Granulocyte percent 81.1 (A) 37 - 80 %G   RBC 4.94 4.69 - 6.13 M/uL   Hemoglobin 14.4 14.1 - 18.1 g/dL   HCT, POC 42.3 (A) 43.5 - 53.7 %   MCV 85.6 80 - 97 fL   MCH, POC 29.2 27 - 31.2 pg   MCHC 34.2 31.8 - 35.4 g/dL   RDW, POC 13.8 %   Platelet Count, POC 300 142 - 424 K/uL   MPV 7.2 0 - 99.8 fL    Assessment and Plan: Malaise - Plan: POCT CBC, POCT Influenza A/B, oseltamivir (TAMIFLU) 75 MG capsule  Fever, low grade - Plan: POCT CBC, POCT Influenza A/B, oseltamivir (TAMIFLU) 75 MG capsule  Body aches - Plan: POCT CBC, POCT Influenza A/B, oseltamivir (TAMIFLU) 75 MG capsule here today with fever, malaise.  Flu is a possibility although test is negative.  Will start on tamiflu and he will rest, use antipyretics and let me know if not better, or if any sx becomes more predominant.    Signed Lamar Blinks, MD

## 2015-12-22 ENCOUNTER — Other Ambulatory Visit: Payer: Self-pay | Admitting: Family Medicine

## 2016-01-11 ENCOUNTER — Encounter: Payer: Self-pay | Admitting: Family Medicine

## 2016-01-16 ENCOUNTER — Encounter: Payer: Self-pay | Admitting: Family Medicine

## 2016-01-22 ENCOUNTER — Encounter: Payer: Self-pay | Admitting: Family Medicine

## 2016-01-23 ENCOUNTER — Ambulatory Visit (INDEPENDENT_AMBULATORY_CARE_PROVIDER_SITE_OTHER): Payer: BLUE CROSS/BLUE SHIELD | Admitting: Family Medicine

## 2016-01-23 ENCOUNTER — Encounter: Payer: Self-pay | Admitting: Family Medicine

## 2016-01-23 VITALS — BP 120/82 | HR 75 | Temp 97.9°F | Resp 16 | Ht 66.25 in | Wt 177.2 lb

## 2016-01-23 DIAGNOSIS — M25569 Pain in unspecified knee: Secondary | ICD-10-CM

## 2016-01-23 DIAGNOSIS — G8929 Other chronic pain: Secondary | ICD-10-CM

## 2016-01-23 DIAGNOSIS — Z131 Encounter for screening for diabetes mellitus: Secondary | ICD-10-CM

## 2016-01-23 DIAGNOSIS — B009 Herpesviral infection, unspecified: Secondary | ICD-10-CM | POA: Diagnosis not present

## 2016-01-23 DIAGNOSIS — Z0000 Encounter for general adult medical examination without abnormal findings: Secondary | ICD-10-CM | POA: Diagnosis not present

## 2016-01-23 DIAGNOSIS — R972 Elevated prostate specific antigen [PSA]: Secondary | ICD-10-CM

## 2016-01-23 LAB — HEMOGLOBIN A1C
HEMOGLOBIN A1C: 6 % — AB (ref <5.7)
MEAN PLASMA GLUCOSE: 126 mg/dL — AB (ref <117)

## 2016-01-23 LAB — COMPREHENSIVE METABOLIC PANEL
ALK PHOS: 49 U/L (ref 40–115)
ALT: 20 U/L (ref 9–46)
AST: 20 U/L (ref 10–35)
Albumin: 4.2 g/dL (ref 3.6–5.1)
BUN: 17 mg/dL (ref 7–25)
CALCIUM: 9.3 mg/dL (ref 8.6–10.3)
CHLORIDE: 103 mmol/L (ref 98–110)
CO2: 25 mmol/L (ref 20–31)
Creat: 1.03 mg/dL (ref 0.70–1.25)
Glucose, Bld: 114 mg/dL — ABNORMAL HIGH (ref 65–99)
POTASSIUM: 4.9 mmol/L (ref 3.5–5.3)
Sodium: 141 mmol/L (ref 135–146)
TOTAL PROTEIN: 6.6 g/dL (ref 6.1–8.1)
Total Bilirubin: 0.7 mg/dL (ref 0.2–1.2)

## 2016-01-23 MED ORDER — TRAMADOL HCL 50 MG PO TABS: 50.0000 mg | ORAL_TABLET | Freq: Three times a day (TID) | ORAL | Status: DC | PRN

## 2016-01-23 MED ORDER — ACYCLOVIR 400 MG PO TABS: ORAL_TABLET | ORAL | Status: DC

## 2016-01-23 NOTE — Patient Instructions (Addendum)
It was great to see you today as always I will be in touch with your labs asap  It would be my pleasure to continue to see you as a patient at my new office, starting the last week of February.  If you prefer to remain at [facility] one of my partners will be happy to see you here.   [facility] [address] [place], [place] [address] Phone: [phone]

## 2016-01-23 NOTE — Progress Notes (Signed)
[facility] [address], [place] [address] [phone]  Date:  01/23/2016   Name:  John Wagner.   DOB:  [birth year]   MRN:  [id number]  PCP:  Lamar Blinks, MD    Chief Complaint: Annual Exam and Medication Refill   History of Present Illness:  John Honts. is a 65 y.o. very pleasant male patient who presents with the following:  History of HTN, high cholesterol, anxiety, ED here today for a CPE.   Last complete labs in 12/2014  BPH is managed by urology [place]- every 6 month PSA. He would like to have this done today and I will send to Dr. 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  He would like a RF prior to my leaving but he does not need to fill this yet.  He has had periodic left knee pain and swelling since an injury that occurred when he was young. 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 BPH (benign prostatic hyperplasia) 12/25/2014  . Erectile dysfunction 03/10/2014  . Anxiety 12/06/2012  . Psoriasis 12/06/2012  . Hypertension   . Hyperlipidemia     Past Medical History  Diagnosis Date  . Hypertension   . Hyperlipidemia   . Psoriasis   . Anxiety     Past Surgical History  Procedure Laterality Date  . Appendectomy      Social History  Substance Use Topics  . Smoking status: Former Research scientist (life sciences)  . Smokeless tobacco: None  . Alcohol Use: Yes    Family History  Problem Relation Age of Onset  . Cancer Father     prostate cancer  . Hypertension Mother   . Heart disease Mother     Allergies  Allergen Reactions  . Buspar [Buspirone]     CNS   . Statins     myalgias    Medication list has been reviewed and updated.  Current Outpatient Prescriptions on File Prior to Visit  Medication Sig Dispense Refill  . acyclovir (ZOVIRAX) 400 MG tablet TAKE 1 TABLET BY MOUTH THREE TIMES A DAY FOR OUTBREAKS 15 tablet 0  . ALPRAZolam (XANAX) 1 MG tablet take 1/2 tablet at noon and 1/2 tablet at bedtime (MAY TAKE A WHOLE TABLET IF NEEDED ON OCCASION) 40 tablet 1  . betamethasone valerate lotion (VALISONE) 0.1 % Apply 1 application topically 2 (two) times daily. Reported on 01/23/2016    . lisinopril (PRINIVIL,ZESTRIL) 5 MG tablet Take 1 tablet (5 mg total) by mouth daily. 90 tablet 3  . propranolol (INDERAL) 10 MG tablet Use twice a day as needed for performance anxiety 30 tablet 1  . traMADol (ULTRAM) 50 MG tablet Take 1 tablet (50 mg total) by mouth every 8 (eight) hours as needed. 30 tablet 1  . vardenafil (LEVITRA) 20 MG tablet Take 1 tablet (20 mg total) by mouth daily as needed for erectile dysfunction. 95 tablet 0  . hydrocortisone (ANUSOL-HC) 25 MG suppository Place 1 suppository (25 mg total) rectally daily. (Patient not taking: Reported on  01/23/2016) 12 suppository 0  . oseltamivir (TAMIFLU) 75 MG capsule Take 1 capsule (75 mg total) by mouth 2 (two) times daily. (Patient not taking: Reported on 01/23/2016) 10 capsule 0   No current facility-administered medications on file prior to visit.    Review of Systems:  As per HPI- otherwise negative.   Physical Examination: Filed Vitals:   01/23/16 1018 01/23/16 1024  BP: 128/90 124/100  Pulse: 75   Temp: 97.9 F (36.6 C)   Resp: 16    Filed Vitals:   01/23/16 1018  Height: 5' 6.25" (1.683 m)  Weight: 177 lb 3.2 oz (80.377 kg)   Body mass index is 28.38 kg/(m^2). Ideal Body Weight: Weight in (lb) to have BMI = 25: 155.7  GEN: WDWN, NAD, Non-toxic, A & O x 3, mild overweight, looks well HEENT: Atraumatic, Normocephalic. Neck supple. No masses, No LAD.  Bilateral TM wnl,  oropharynx normal.  PEERL,EOMI.   Ears and Nose: No external deformity. CV: RRR, No M/G/R. No JVD. No thrill. No extra heart sounds. PULM: CTA B, no wheezes, crackles, rhonchi. No retractions. No resp. distress. No accessory muscle use. ABD: S, NT, ND, +BS. No rebound. No HSM. EXTR: No c/c/e NEURO Normal gait.  PSYCH: Normally interactive. Conversant. Not depressed or anxious appearing.  Calm demeanor.  Left knee with effusion   Assessment and Plan: Physical exam  Elevated PSA - Plan: PSA  Screening for diabetes mellitus - Plan: Comprehensive metabolic panel, Hemoglobin A1c  HSV infection - Plan: acyclovir (ZOVIRAX) 400 MG tablet  Chronic knee pain, unspecified laterality - Plan: traMADol (ULTRAM) 50 MG tablet  Send PSA to Dr Karsten Ro for him Did refills Tramadol as needed for left knee pain. At this point he is not bothered enough to consider knee surgery Will plan further follow- up pending labs.   Signed Lamar Blinks, MD

## 2016-01-24 ENCOUNTER — Encounter: Payer: Self-pay | Admitting: Family Medicine

## 2016-01-24 DIAGNOSIS — R7303 Prediabetes: Secondary | ICD-10-CM | POA: Insufficient documentation

## 2016-01-24 LAB — PSA: PSA: 7.73 ng/mL — AB (ref <=4.00)

## 2016-01-30 ENCOUNTER — Encounter: Payer: Self-pay | Admitting: Family Medicine

## 2016-02-06 ENCOUNTER — Other Ambulatory Visit: Payer: Self-pay | Admitting: Family Medicine

## 2016-04-08 ENCOUNTER — Telehealth: Payer: Self-pay | Admitting: Family Medicine

## 2016-04-08 MED ORDER — ALPRAZOLAM 1 MG PO TABS: ORAL_TABLET | ORAL | Status: DC

## 2016-04-08 NOTE — Telephone Encounter (Signed)
Relation to [id number]  Call back [phone] Pharmacy: [facility], [place] [phone] (Phone) [phone] (Fax)         Reason for call:  Patient requesting a refill ALPRAZolam Duanne Moron) 1 MG tablet and scheduled follow up appointment for 07/02/2016.

## 2016-04-08 NOTE — Telephone Encounter (Signed)
Last filled:  12/23/15 Amt: 40,1  Last OV:  01/23/16 No UDS or contract on file.    Please advise.

## 2016-06-26 ENCOUNTER — Encounter: Payer: Self-pay | Admitting: Family Medicine

## 2016-07-02 ENCOUNTER — Ambulatory Visit (INDEPENDENT_AMBULATORY_CARE_PROVIDER_SITE_OTHER): Payer: BLUE CROSS/BLUE SHIELD | Admitting: Family Medicine

## 2016-07-02 ENCOUNTER — Encounter: Payer: Self-pay | Admitting: Family Medicine

## 2016-07-02 VITALS — BP 112/82 | HR 76 | Temp 97.8°F | Ht 66.0 in | Wt 180.8 lb

## 2016-07-02 DIAGNOSIS — F418 Other specified anxiety disorders: Secondary | ICD-10-CM

## 2016-07-02 DIAGNOSIS — F411 Generalized anxiety disorder: Secondary | ICD-10-CM | POA: Diagnosis not present

## 2016-07-02 DIAGNOSIS — R5382 Chronic fatigue, unspecified: Secondary | ICD-10-CM

## 2016-07-02 DIAGNOSIS — F419 Anxiety disorder, unspecified: Secondary | ICD-10-CM

## 2016-07-02 DIAGNOSIS — G8929 Other chronic pain: Secondary | ICD-10-CM

## 2016-07-02 DIAGNOSIS — E785 Hyperlipidemia, unspecified: Secondary | ICD-10-CM

## 2016-07-02 DIAGNOSIS — M25569 Pain in unspecified knee: Secondary | ICD-10-CM

## 2016-07-02 DIAGNOSIS — R7303 Prediabetes: Secondary | ICD-10-CM | POA: Diagnosis not present

## 2016-07-02 DIAGNOSIS — R972 Elevated prostate specific antigen [PSA]: Secondary | ICD-10-CM

## 2016-07-02 DIAGNOSIS — I10 Essential (primary) hypertension: Secondary | ICD-10-CM

## 2016-07-02 LAB — LIPID PANEL
CHOLESTEROL: 242 mg/dL — AB (ref 0–200)
HDL: 45.8 mg/dL (ref >39.00)
LDL Cholesterol: 167 mg/dL — ABNORMAL HIGH (ref 0–99)
NonHDL: 196.64
TRIGLYCERIDES: 148 mg/dL (ref 0.0–149.0)
Total CHOL/HDL Ratio: 5
VLDL: 29.6 mg/dL (ref 0.0–40.0)

## 2016-07-02 LAB — HEMOGLOBIN A1C: HEMOGLOBIN A1C: 6 % (ref 4.6–6.5)

## 2016-07-02 LAB — PSA: PSA: 8.16 ng/mL — AB (ref 0.10–4.00)

## 2016-07-02 LAB — TSH: TSH: 1.5 u[IU]/mL (ref 0.35–4.50)

## 2016-07-02 MED ORDER — TRAMADOL HCL 50 MG PO TABS: 50.0000 mg | ORAL_TABLET | Freq: Three times a day (TID) | ORAL | Status: DC | PRN

## 2016-07-02 MED ORDER — ALPRAZOLAM 1 MG PO TABS: ORAL_TABLET | ORAL | Status: DC

## 2016-07-02 MED ORDER — LISINOPRIL 5 MG PO TABS: 5.0000 mg | ORAL_TABLET | Freq: Every day | ORAL | Status: DC

## 2016-07-02 MED ORDER — PROPRANOLOL HCL 10 MG PO TABS: ORAL_TABLET | ORAL | Status: DC

## 2016-07-02 NOTE — Progress Notes (Signed)
[place] at [facility] [address] [place], [place] [address] [phone]  Date:  07/02/2016   Name:  John Wagner.   DOB:  1951-01-15   MRN:  [id number]  PCP:  Lamar Blinks, MD    Chief Complaint: Follow-up   History of Present Illness:  John Wagner. is a 65 y.o. very pleasant male patient who presents with the following:  History of BPH, HTN, hyperlipidemia, anxiety, ED. Here today for a follow-up visit, refill and to discuss fatigue.   He wanted to make sure that his hep C is negative, which is it  Apparently his brother had an "adrenal problem" and was very fatigued- this occurred after his wife left him and he was very stressed out.  He has now recovered following treatment. Discussed this possibility for him- this is a rare occurrence and he has had his sx for years. In the end he declined testing his cortisol, etc.  He notes that he has always felt fatigued, more than others, over his entire life really  His urologist is Dr. Karsten Ro who follows his BPH. "i'm on cancer watch," needs a PSA today He uses xanax BID and propranolol on occasion for anxiety. Tramadol on occasion for knee pain Recent labs showed an A1c of 6% Last thyroid in 2014- normal  He is fasting today  He plans to have the zostavax shot this weekend- declines today He is taking 0.5 xanax at noon and 0.5 at bedtime, he may take a full tablet on occasion if he has a particular stress.  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 He does well with the betablocker prior to    Patient Active Problem List   Diagnosis Date Noted  . Pre-diabetes 01/24/2016  . BPH (benign prostatic hyperplasia) 12/25/2014  . Erectile dysfunction 03/10/2014  . Anxiety 12/06/2012  . Psoriasis 12/06/2012  . Hypertension   . Hyperlipidemia     Past Medical History  Diagnosis Date  . Hypertension   . Hyperlipidemia   . Psoriasis   .  Anxiety     Past Surgical History  Procedure Laterality Date  . Appendectomy      Social History  Substance Use Topics  . Smoking status: Former Research scientist (life sciences)  . Smokeless tobacco: None  . Alcohol Use: Yes    Family History  Problem Relation Age of Onset  . Cancer Father     prostate cancer  . Hypertension Mother   . Heart disease Mother     Allergies  Allergen Reactions  . Buspar [Buspirone]     CNS  . Statins     myalgias    Medication list has been reviewed and updated.  Current Outpatient Prescriptions on File Prior to Visit  Medication Sig Dispense Refill  . acyclovir (ZOVIRAX) 400 MG tablet TAKE 1 TABLET BY MOUTH THREE TIMES A DAY FOR OUTBREAKS 45 tablet 2  . ALPRAZolam (XANAX) 1 MG tablet take 1/2 tablet at noon and 1/2 tablet at bedtime (MAY TAKE A WHOLE TABLET IF NEEDED ON OCCASION) 40 tablet 0  . betamethasone valerate lotion (VALISONE) 0.1 % Apply 1 application topically 2 (two) times daily. Reported on 01/23/2016    . lisinopril (PRINIVIL,ZESTRIL) 5 MG tablet take 1 tablet by mouth once daily 90 tablet 1  . propranolol (INDERAL) 10 MG tablet Use twice a day as needed for performance anxiety 30 tablet 1  .  traMADol (ULTRAM) 50 MG tablet Take 1 tablet (50 mg total) by mouth every 8 (eight) hours as needed. 30 tablet 1  . vardenafil (LEVITRA) 20 MG tablet Take 1 tablet (20 mg total) by mouth daily as needed for erectile dysfunction. 95 tablet 0   No current facility-administered medications on file prior to visit.    Review of Systems:  As per HPI- otherwise negative. BP Readings from Last 3 Encounters:  07/02/16 112/82  01/23/16 120/82  12/11/15 142/82   Wt Readings from Last 3 Encounters:  07/02/16 180 lb 12.8 oz (82.01 kg)  01/23/16 177 lb 3.2 oz (80.377 kg)  12/11/15 184 lb 9.6 oz (83.734 kg)      Physical Examination: Filed Vitals:   07/02/16 0847  BP: 112/82  Pulse: 76  Temp: 97.8 F (36.6 C)   Filed Vitals:   07/02/16 0847  Height: 5\' 6"   (1.676 m)  Weight: 180 lb 12.8 oz (82.01 kg)   Body mass index is 29.2 kg/(m^2). Ideal Body Weight: Weight in (lb) to have BMI = 25: 154.6  GEN: WDWN, NAD, Non-toxic, A & O x 3, mild overweight, looks well HEENT: Atraumatic, Normocephalic. Neck supple. No masses, No LAD. Ears and Nose: No external deformity. CV: RRR, No M/G/R. No JVD. No thrill. No extra heart sounds. PULM: CTA B, no wheezes, crackles, rhonchi. No retractions. No resp. distress. No accessory muscle use. EXTR: No c/c/e NEURO Normal gait.  PSYCH: Normally interactive. Conversant. Not depressed or anxious appearing.  Calm demeanor.    Assessment and Plan: Chronic fatigue - Plan: TSH  Chronic knee pain, unspecified laterality - Plan: traMADol (ULTRAM) 50 MG tablet  GAD (generalized anxiety disorder) - Plan: propranolol (INDERAL) 10 MG tablet, ALPRAZolam (XANAX) 1 MG tablet  Performance anxiety - Plan: propranolol (INDERAL) 10 MG tablet  Essential hypertension - Plan: lisinopril (PRINIVIL,ZESTRIL) 5 MG tablet  Pre-diabetes - Plan: Hemoglobin A1c  Dyslipidemia - Plan: Lipid panel  Elevated PSA - Plan: PSA  Sent PSA to Dr. Karsten Ro Refilled medications as above Labs pending Discussed his fatigue- it is long- standing.  Discussed testing his T but he does not want to do this. Would not be a good candidate for treatment here anyway with his PSA issues.   For the time being he wishes to continue observing his fatigue and will let me know if any changes occur.     Signed Lamar Blinks, MD

## 2016-07-02 NOTE — Progress Notes (Signed)
Pre visit review using our clinic review tool, if applicable. No additional management support is needed unless otherwise documented below in the visit note. 

## 2016-07-02 NOTE — Patient Instructions (Addendum)
I will be in touch with your thyroid results asap Continue to use your medications as needed.  I think that your current dose of xanax is reasonable for you to use, but do avoid taking it with alcohol. We also want to avoid increasing your dose over time   Please come and see me in about 6 months and take care!

## 2016-11-13 ENCOUNTER — Other Ambulatory Visit: Payer: Self-pay | Admitting: Family Medicine

## 2016-11-13 DIAGNOSIS — F411 Generalized anxiety disorder: Secondary | ICD-10-CM

## 2016-11-17 NOTE — Telephone Encounter (Signed)
Requesting:  Alprazolam Contract  None UDS   None Last OV   07/02/2016 Last Refill   #40 with 2 refills on 07/02/2016  Please Advise

## 2017-01-26 ENCOUNTER — Ambulatory Visit (INDEPENDENT_AMBULATORY_CARE_PROVIDER_SITE_OTHER): Payer: BLUE CROSS/BLUE SHIELD | Admitting: Family Medicine

## 2017-01-26 ENCOUNTER — Encounter: Payer: Self-pay | Admitting: Family Medicine

## 2017-01-26 VITALS — BP 132/80 | HR 66 | Temp 98.0°F | Ht 66.0 in | Wt 184.0 lb

## 2017-01-26 DIAGNOSIS — I10 Essential (primary) hypertension: Secondary | ICD-10-CM

## 2017-01-26 DIAGNOSIS — Z23 Encounter for immunization: Secondary | ICD-10-CM | POA: Diagnosis not present

## 2017-01-26 DIAGNOSIS — Z0001 Encounter for general adult medical examination with abnormal findings: Secondary | ICD-10-CM

## 2017-01-26 DIAGNOSIS — E785 Hyperlipidemia, unspecified: Secondary | ICD-10-CM | POA: Diagnosis not present

## 2017-01-26 DIAGNOSIS — Z130 Encounter for screening for diseases of the blood and blood-forming organs and certain disorders involving the immune mechanism: Secondary | ICD-10-CM

## 2017-01-26 DIAGNOSIS — N401 Enlarged prostate with lower urinary tract symptoms: Secondary | ICD-10-CM | POA: Diagnosis not present

## 2017-01-26 DIAGNOSIS — M25569 Pain in unspecified knee: Secondary | ICD-10-CM

## 2017-01-26 DIAGNOSIS — Z131 Encounter for screening for diabetes mellitus: Secondary | ICD-10-CM

## 2017-01-26 DIAGNOSIS — G8929 Other chronic pain: Secondary | ICD-10-CM

## 2017-01-26 DIAGNOSIS — F411 Generalized anxiety disorder: Secondary | ICD-10-CM

## 2017-01-26 DIAGNOSIS — G5602 Carpal tunnel syndrome, left upper limb: Secondary | ICD-10-CM

## 2017-01-26 DIAGNOSIS — Z0000 Encounter for general adult medical examination without abnormal findings: Secondary | ICD-10-CM

## 2017-01-26 LAB — COMPREHENSIVE METABOLIC PANEL
ALK PHOS: 47 U/L (ref 39–117)
ALT: 19 U/L (ref 0–53)
AST: 14 U/L (ref 0–37)
Albumin: 4.2 g/dL (ref 3.5–5.2)
BILIRUBIN TOTAL: 0.5 mg/dL (ref 0.2–1.2)
BUN: 17 mg/dL (ref 6–23)
CO2: 30 meq/L (ref 19–32)
CREATININE: 0.96 mg/dL (ref 0.40–1.50)
Calcium: 9.3 mg/dL (ref 8.4–10.5)
Chloride: 106 mEq/L (ref 96–112)
GFR: 83.83 mL/min (ref >60.00)
GLUCOSE: 120 mg/dL — AB (ref 70–99)
Potassium: 4.9 mEq/L (ref 3.5–5.1)
Sodium: 141 mEq/L (ref 135–145)
Total Protein: 6.5 g/dL (ref 6.0–8.3)

## 2017-01-26 LAB — CBC
HCT: 44.9 % (ref 39.0–52.0)
Hemoglobin: 14.9 g/dL (ref 13.0–17.0)
MCHC: 33.3 g/dL (ref 30.0–36.0)
MCV: 86.9 fl (ref 78.0–100.0)
Platelets: 397 10*3/uL (ref 150.0–400.0)
RBC: 5.17 Mil/uL (ref 4.22–5.81)
RDW: 13.9 % (ref 11.5–15.5)
WBC: 5.7 10*3/uL (ref 4.0–10.5)

## 2017-01-26 LAB — LIPID PANEL
Cholesterol: 225 mg/dL — ABNORMAL HIGH (ref 0–200)
HDL: 47.5 mg/dL (ref >39.00)
LDL Cholesterol: 156 mg/dL — ABNORMAL HIGH (ref 0–99)
NONHDL: 177.26
Total CHOL/HDL Ratio: 5
Triglycerides: 106 mg/dL (ref 0.0–149.0)
VLDL: 21.2 mg/dL (ref 0.0–40.0)

## 2017-01-26 LAB — HEMOGLOBIN A1C: HEMOGLOBIN A1C: 6.1 % (ref 4.6–6.5)

## 2017-01-26 LAB — PSA: PSA: 7.92 ng/mL — ABNORMAL HIGH (ref 0.10–4.00)

## 2017-01-26 MED ORDER — TRAMADOL HCL 50 MG PO TABS: 50.0000 mg | ORAL_TABLET | Freq: Three times a day (TID) | ORAL | 1 refills | Status: DC | PRN

## 2017-01-26 MED ORDER — ALPRAZOLAM 1 MG PO TABS: ORAL_TABLET | ORAL | 2 refills | Status: DC

## 2017-01-26 NOTE — Progress Notes (Signed)
[place] at [facility] [address] [place], [place] [address] [phone]  Date:  01/26/2017   Name:  John Wagner.   DOB:  1951-08-01   MRN:  [id number]  PCP:  Lamar Blinks, MD    Chief Complaint: Annual Exam (Pt here for CPE. Pt is fasting for labs. Declined flu vaccine. )   History of Present Illness:  John Wagner. is a 66 y.o. very pleasant male patient who presents with the following:  History of HTN, hyperlipidemia (experienced myalgias with Statins), anxiety, ED here today for a CPE.  Last CPE on 01-23-2016. Due for full labs today.  He is fasting for same  Needs refills on Alprazolam & Tramadol. NCCSR- last fill of tramadol #30 on 11/17/16  Last fill xanax #40, same date  Thinks that his BP is high today, because his weight is too high.  Eats a lot of rich food. His partner does the cooking and she tends towards less healthy items.   Does bagpiping twice per week.  Does not really get any other exercise.  Would like to have weight at 170.    BPH is managed by urology [facility]- every 6 month PSA. He would like to have this done today and I will send to Dr. Karsten Ro.  Last appt with urology was Nov/Dec 2017.    He is fasting today  He takes xanax 0.5 BID- this controls his anxiety sx and he feels that it works great for him.  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 He uses the propranolol also on occasion.  Last used this weekend.  Has had some lower back recently.  Is wearing a right ankle brace.  He has less left knee issues. Tramadol use is very sporadic, not every week.  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  Last used in the fall.  Reviewed [facility]- his last xanax rx was for #40 on 11/17/16  Left hand goes tingly sometimes over the last 3 weeks or so. Can occur when he is playing the bagpipes or at the end of  the day.  He does not have any weakness of the hand.  No neck pain. No other neurological sx that he has noted.    Decided to get flu shot today.  Patient Active Problem List   Diagnosis Date Noted  . Pre-diabetes 01/24/2016  . BPH (benign prostatic hyperplasia) 12/25/2014  . Erectile dysfunction 03/10/2014  . Anxiety 12/06/2012  . Psoriasis 12/06/2012  . Hypertension   . Hyperlipidemia     Past Medical History:  Diagnosis Date  . Anxiety   . Hyperlipidemia   . Hypertension   . Psoriasis     Past Surgical History:  Procedure Laterality Date  . APPENDECTOMY      Social History  Substance Use Topics  . Smoking status: Former Research scientist (life sciences)  . Smokeless tobacco: Never Used  . Alcohol use Yes    Family History  Problem Relation Age of Onset  . Cancer Father     prostate cancer  . Hypertension Mother   . Heart disease Mother     Allergies  Allergen Reactions  . Buspar [Buspirone]     CNS  . Statins     myalgias    Medication list has been reviewed and updated.  Current Outpatient Prescriptions on File Prior to Visit  Medication Sig Dispense Refill  . acyclovir (ZOVIRAX) 400 MG tablet TAKE 1 TABLET BY MOUTH THREE TIMES A DAY FOR OUTBREAKS 45 tablet 2  . ALPRAZolam (XANAX) 1 MG tablet take 1/2 tablet by mouth daily at noon and take 1/2 tablet at bedtime  (MAY TAKE 1 WHOLE TABLET IF NEEDED, ON OCCASION--30 DAY SUPPLY-) 40 tablet 2  . betamethasone valerate lotion (VALISONE) 0.1 % Apply 1 application topically 2 (two) times daily. Reported on 01/23/2016    . lisinopril (PRINIVIL,ZESTRIL) 5 MG tablet Take 1 tablet (5 mg total) by mouth daily. 90 tablet 3  . propranolol (INDERAL) 10 MG tablet Use twice a day as needed for performance anxiety 30 tablet 1  . traMADol (ULTRAM) 50 MG tablet Take 1 tablet (50 mg total) by mouth every 8 (eight) hours as needed. 30 tablet 1  . vardenafil (LEVITRA) 20 MG tablet Take 1 tablet (20 mg total) by mouth daily as needed for erectile  dysfunction. 95 tablet 0   No current facility-administered medications on file prior to visit.     Review of Systems:  Review of Systems  Constitutional: Positive for malaise/fatigue. Negative for chills, diaphoresis and fever.       Positive for weight gain.  HENT: Negative for congestion, ear discharge, ear pain, hearing loss, nosebleeds, sinus pain, sore throat and tinnitus.   Eyes: Negative for blurred vision, double vision, photophobia, pain, discharge and redness.  Respiratory: Negative for cough, hemoptysis, sputum production, shortness of breath and wheezing.   Cardiovascular: Negative for chest pain, palpitations, orthopnea and leg swelling.  Gastrointestinal: Negative for abdominal pain, blood in stool, constipation, diarrhea, heartburn, melena, nausea and vomiting.  Genitourinary: Negative for dysuria, flank pain, frequency, hematuria and urgency.  Musculoskeletal: Positive for back pain and joint pain. Negative for falls, myalgias and neck pain.  Skin: Negative for itching and rash.  Neurological: Positive for tingling. Negative for dizziness, tremors, sensory change, speech change, focal weakness, weakness and headaches.  Endo/Heme/Allergies: Negative for polydipsia.  Psychiatric/Behavioral: Negative for depression, hallucinations, memory loss, substance abuse and suicidal ideas. The patient is nervous/anxious. The patient does not have insomnia.      Physical Examination: Vitals:   01/26/17 0839 01/26/17 0906  BP: (!) 147/99 132/80  Pulse:    Temp:     Vitals:   01/26/17 0836  Weight: 184 lb (83.5 kg)  Height: 5' 6" (1.676 m)   Body mass index is 29.7 kg/m. Ideal Body Weight: Weight in (lb) to have BMI = 25: 154.6  Physical Examination: General appearance - alert, well appearing, and in no distress, oriented to person, place, and time, overweight and anxious Mental status - alert, oriented to person, place, and time, anxious Eyes - pupils equal and reactive,  extraocular eye movements intact Ears - bilateral TM's and external ear canals normal Nose - normal and patent, no erythema, discharge or polyps Mouth - mucous membranes moist, pharynx normal without lesions Neck - supple, no significant adenopathy Lymphatics - no palpable lymphadenopathy, no hepatosplenomegaly Chest - clear to auscultation, no wheezes, rales or rhonchi, symmetric air entry, no tachypnea, retractions or cyanosis Heart - normal rate, regular rhythm, normal S1, S2, no murmurs, rubs, clicks or gallops Abdomen - soft, nontender, nondistended, no masses or organomegaly GU Male - patient declined Rectal - negative without mass, lesions or tenderness, declined by patient Back exam - full range of motion, no tenderness, palpable spasm or pain on motion Neurological - alert, oriented, normal speech, no focal findings or movement   disorder noted Musculoskeletal - no joint tenderness, deformity or swelling, no muscular tenderness noted Extremities - peripheral pulses normal, no pedal edema, no clubbing or cyanosis Skin - normal coloration and turgor, no rashes, no suspicious skin lesions noted Normal strength and sensation, cap refill of both hands and arms. Not able to reproduce tingling with CTS testing at this time    Assessment and Plan:  Physical exam  Screening for deficiency anemia - Plan: CBC  Hyperlipidemia, unspecified hyperlipidemia type - Plan: Lipid panel, CANCELED: Lipid panel  Benign prostatic hyperplasia with lower urinary tract symptoms, symptom details unspecified - Plan: PSA  Essential hypertension - Plan: Comp Met (CMET), Lipid panel, Hemoglobin A1C  Screening for diabetes mellitus - Plan: CANCELED: Hemoglobin A1c  Left carpal tunnel syndrome  Chronic knee pain, unspecified laterality - Plan: traMADol (ULTRAM) 50 MG tablet  GAD (generalized anxiety disorder) - Plan: ALPRAZolam (XANAX) 1 MG tablet  Need for influenza vaccination - Plan: Flu Vaccine QUAD  36+ mos IM (Fluarix & Fluzone Quad PF  Here today for a CPE Labs pending as above He uses tramdol prn for back pain and xanax prn for anxiety- refilled both today No unexpected entries on NCCSR Trial of OTC carpal tunnel brace for left wrist Will plan further follow- up pending labs.   Meds ordered this encounter  Medications  . traMADol (ULTRAM) 50 MG tablet    Sig: Take 1 tablet (50 mg total) by mouth every 8 (eight) hours as needed.    Dispense:  30 tablet    Refill:  1  . ALPRAZolam (XANAX) 1 MG tablet    Sig: take 1/2 tablet by mouth daily at noon and take 1/2 tablet at bedtime  (MAY TAKE 1 WHOLE TABLET IF NEEDED, ON OCCASION--30 DAY SUPPLY-)    Dispense:  40 tablet    Refill:  2     Patient instructions:  It was a pleasure to see you today- take care and we will be in touch with your labs asap Try an OTC carpal tunnel brace for your left hand- wear it at night (and you can also wear it during the day when practical).  Let me know if this does not help relieve your left hand numbness/ tingling over the next 2-3 weeks- let me know sooner if worse  Prior EKG did not show any evidence of an old heart attack, but please do let me know if you have any exertional chest pain or shortness of breath   Signed Lamar Blinks, MD

## 2017-01-26 NOTE — Patient Instructions (Addendum)
It was a pleasure to see you today- take care and we will be in touch with your labs asap Try an OTC carpal tunnel brace for your left hand- wear it at night (and you can also wear it during the day when practical).  Let me know if this does not help relieve your left hand numbness/ tingling over the next 2-3 weeks- let me know sooner if worse  Prior EKG did not show any evidence of an old heart attack, but please do let me know if you have any exertional chest pain or shortness of breath

## 2017-01-28 ENCOUNTER — Encounter: Payer: Self-pay | Admitting: Family Medicine

## 2017-05-08 ENCOUNTER — Other Ambulatory Visit: Payer: Self-pay | Admitting: Family Medicine

## 2017-05-08 DIAGNOSIS — F411 Generalized anxiety disorder: Secondary | ICD-10-CM

## 2017-05-11 ENCOUNTER — Other Ambulatory Visit: Payer: Self-pay | Admitting: Emergency Medicine

## 2017-05-11 DIAGNOSIS — F411 Generalized anxiety disorder: Secondary | ICD-10-CM

## 2017-05-11 MED ORDER — ALPRAZOLAM 1 MG PO TABS: ORAL_TABLET | ORAL | 2 refills | Status: DC

## 2017-05-11 NOTE — Telephone Encounter (Signed)
NCCSR:  Last fill of xananx 4/20- 3 day supply Then 3/17, 2/12 No unexpected entries

## 2017-05-11 NOTE — Telephone Encounter (Signed)
Requesting: ALPRAZolam (XANAX) 1 MG tablet.  Last visit: 01/26/17 Last refill: 01/26/17  Please advise.

## 2017-05-22 ENCOUNTER — Ambulatory Visit (INDEPENDENT_AMBULATORY_CARE_PROVIDER_SITE_OTHER): Payer: BLUE CROSS/BLUE SHIELD | Admitting: Medical

## 2017-05-22 ENCOUNTER — Encounter: Payer: Self-pay | Admitting: Medical

## 2017-05-22 VITALS — BP 126/73 | HR 97 | Temp 98.5°F | Resp 16 | Ht 66.0 in | Wt 188.0 lb

## 2017-05-22 DIAGNOSIS — T7840XA Allergy, unspecified, initial encounter: Secondary | ICD-10-CM

## 2017-05-22 MED ORDER — METHYLPREDNISOLONE ACETATE 40 MG/ML IJ SUSP
40.0000 mg | Freq: Once | INTRAMUSCULAR | Status: AC
  Administered 2017-05-22: 40 mg via INTRAMUSCULAR

## 2017-05-22 MED ORDER — HYDROXYZINE HCL 25 MG PO TABS: 25.0000 mg | ORAL_TABLET | Freq: Three times a day (TID) | ORAL | 0 refills | Status: DC | PRN

## 2017-05-22 MED ORDER — PREDNISONE 10 MG PO TABS: ORAL_TABLET | ORAL | 0 refills | Status: DC

## 2017-05-22 NOTE — Patient Instructions (Addendum)
For allergic reaction we gave depomedrol 40 mg im. Also start prednisone 6 day taper dose rx. For itching rx hydroxyzine.  Avoid re-exposure. Area should gradually get better. If not let us know.  Follow up in 7-10 days or as needed

## 2017-05-22 NOTE — Progress Notes (Signed)
Subjective:    Patient ID: John Wagner., male    DOB: 1951/09/10, 66 y.o.   MRN: [id number]  HPI  Pt in with recent rash on his rt forearm, left forearm and medial calfs. Started on rt forearm first. Wednesday had the exposure. Pt has very senistive to poison ivy. He was pulling weeds around ac unit before this started. He indicates likely exposure to poison ivy.  Pt tried to wash off exposed area.  Review of Systems  Constitutional: Negative for chills, fatigue and fever.  Respiratory: Negative for cough, chest tightness, shortness of breath and wheezing.   Cardiovascular: Negative for chest pain and palpitations.  Gastrointestinal: Negative for abdominal pain.  Musculoskeletal: Negative for back pain.  Skin: Positive for rash.       Scattered rash with itching.  Neurological: Negative for dizziness and headaches.  Hematological: Negative for adenopathy. Does not bruise/bleed easily.  Psychiatric/Behavioral: Negative for behavioral problems, confusion, dysphoric mood, self-injury and suicidal ideas. The patient is not nervous/anxious.    Past Medical History:  Diagnosis Date  . Anxiety   . Hyperlipidemia   . Hypertension   . Psoriasis      Social History   Social History  . Marital status: Single    Spouse name: N/A  . Number of children: N/A  . Years of education: N/A   Occupational History  . picture frame/artist [employer]   Social History Main Topics  . Smoking status: Former Research scientist (life sciences)  . Smokeless tobacco: Never Used  . Alcohol use Yes  . Drug use: No  . Sexual activity: Yes   Other Topics Concern  . Not on file   Social History Narrative  . No narrative on file    Past Surgical History:  Procedure Laterality Date  . APPENDECTOMY      Family History  Problem Relation Age of Onset  . Cancer Father        prostate cancer  . Hypertension Mother   . Heart disease Mother     Allergies  Allergen Reactions  . Buspar [Buspirone]     CNS  .  Statins     myalgias    Current Outpatient Prescriptions on File Prior to Visit  Medication Sig Dispense Refill  . acyclovir (ZOVIRAX) 400 MG tablet TAKE 1 TABLET BY MOUTH THREE TIMES A DAY FOR OUTBREAKS 45 tablet 2  . ALPRAZolam (XANAX) 1 MG tablet take 1/2 tablet by mouth daily at noon and take 1/2 tablet at bedtime  (MAY TAKE 1 WHOLE TABLET IF NEEDED, ON OCCASION--30 DAY SUPPLY-) 40 tablet 2  . betamethasone valerate lotion (VALISONE) 0.1 % Apply 1 application topically 2 (two) times daily. Reported on 01/23/2016    . lisinopril (PRINIVIL,ZESTRIL) 5 MG tablet Take 1 tablet (5 mg total) by mouth daily. 90 tablet 3  . propranolol (INDERAL) 10 MG tablet Use twice a day as needed for performance anxiety 30 tablet 1  . traMADol (ULTRAM) 50 MG tablet Take 1 tablet (50 mg total) by mouth every 8 (eight) hours as needed. 30 tablet 1  . vardenafil (LEVITRA) 20 MG tablet Take 1 tablet (20 mg total) by mouth daily as needed for erectile dysfunction. 95 tablet 0   No current facility-administered medications on file prior to visit.     BP 126/73 (BP Location: Left Arm, Patient Position: Sitting, Cuff Size: Normal)   Pulse 97   Temp 98.5 F (36.9 C) (Oral)   Resp 16   Ht 5\' 6"  (1.676  m)   Wt 188 lb (85.3 kg)   SpO2 94%   BMI 30.34 kg/m      Objective:   Physical Exam  General- No acute distress. Pleasant patient. Neck- Full range of motion, no jvd Lungs- Clear, even and unlabored. Heart- regular rate and rhythm. Neurologic- CNII- XII grossly intact.  Skin- rash on both forearms and medial calfs. Worse/most obvious rt forearm. Papules present in linear pattern on both forearms.      Assessment & Plan:  For allergic reaction we gave depomedrol 40 mg im. Also start prednisone 6 day taper dose rx. For itching rx hydroxyzine.  Avoid re-exposure. Are should gradually get better. If not let us know.  Follow up in 7-10 days or as needed  Ho Parisi, Percell Miller, [employer]

## 2017-06-17 ENCOUNTER — Other Ambulatory Visit: Payer: Self-pay | Admitting: Physician Assistant

## 2017-06-17 DIAGNOSIS — B009 Herpesviral infection, unspecified: Secondary | ICD-10-CM

## 2017-07-07 ENCOUNTER — Other Ambulatory Visit: Payer: Self-pay | Admitting: Family Medicine

## 2017-07-07 DIAGNOSIS — I10 Essential (primary) hypertension: Secondary | ICD-10-CM

## 2017-07-26 NOTE — Progress Notes (Addendum)
[place] at [facility] [address] [place], [place] [address] [phone]  Date:  07/29/2017   Name:  John Wagner.   DOB:  [birth year]   MRN:  [id number]  PCP:  Darreld Mclean, MD    Chief Complaint: Follow-up (Pt here for 6 month f/u. )   History of Present Illness:  John Wagner. is a 66 y.o. very pleasant male patient who presents with the following:  Here today for a 6 month follow-up visit History of HTN, hyperlipidemia, pre-diabetes CPE in February:  History of HTN, hyperlipidemia (experienced myalgias with Statins), anxiety, ED here today for a CPE. Last CPE on 01-23-2016. Due for full labs today.  He is fasting for same Needs refills on Alprazolam & Tramadol. NCCSR- last fill of tramadol #30 on 11/17/16  Last fill xanax #40, same date  Thinks that his BP is high today, because his weight is too high.  Eats a lot of rich food. His partner does the cooking and she tends towards less healthy items.   Does bagpiping twice per week.  Does not really get any other exercise.  Would like to have weight at 170.   BPH is managed by urology [facility]- every 6 month PSA. He would like to have this done today and I will send to Dr. Karsten Ro.  Last appt with urology was Nov/Dec 2017.    He is fasting today  He takes xanax 0.5 BID- this controls his anxiety sx and he feels that it works great for him.  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 He uses the propranolol also on occasion.  Last used this weekend.  [place] reviewed today. He last got tramadol 30 pills on 7/27, and on 2/12; he uses this for knee pain,  He has a dx of "broken cartilage" in his left knee but never had surgery for this. His knees bother him off an on,but he is able to manage this without needing surgery at this time.  His right knee did catch a couple of weeks ago while he was  dancing but this got better with rest and compression He gets xanax 40 pills about once a month- last rx on 6/27, 5/21, 4/20- no unexpected entries noted  He does not need xanax or tramadol today   He has been feeling well in general, sleeping well He was down when his GF went out of town; however when she returned he started feeling better His father did die of prostate cancer  He has lost a few lbs by not eating ice cream, he is drinking less beer Lab Results  Component Value Date   HGBA1C 6.1 01/26/2017   BP Readings from Last 3 Encounters:  07/29/17 140/86  05/22/17 126/73  01/26/17 132/80   Wt Readings from Last 3 Encounters:  07/29/17 176 lb 6.4 oz (80 kg)  05/22/17 188 lb (85.3 kg)  01/26/17 184 lb (83.5 kg)   We did an EKG for him in 2014 Upon questioning he notes that he may notice a dull pain under his sternum, for a year or so. It does seem worse when he is under stress.  He is not really worse with exercise.   When he has the pain it may last an hour or so.  Is not present now It goes away on it's own This  may occur every couple of weeks.  He is a former smoker  His mother had history of CAD, angioplasty  Patient Active Problem List   Diagnosis Date Noted  . Pre-diabetes 01/24/2016  . BPH (benign prostatic hyperplasia) 12/25/2014  . Erectile dysfunction 03/10/2014  . Anxiety 12/06/2012  . Psoriasis 12/06/2012  . Hypertension   . Hyperlipidemia     Past Medical History:  Diagnosis Date  . Anxiety   . Hyperlipidemia   . Hypertension   . Psoriasis     Past Surgical History:  Procedure Laterality Date  . APPENDECTOMY      Social History  Substance Use Topics  . Smoking status: Former Research scientist (life sciences)  . Smokeless tobacco: Never Used  . Alcohol use Yes    Family History  Problem Relation Age of Onset  . Cancer Father        prostate cancer  . Hypertension Mother   . Heart disease Mother     Allergies  Allergen Reactions  . Buspar [Buspirone]      CNS  . Statins     myalgias    Medication list has been reviewed and updated.  Current Outpatient Prescriptions on File Prior to Visit  Medication Sig Dispense Refill  . acyclovir (ZOVIRAX) 400 MG tablet TAKE 1 TABLET BY MOUTH THREE TIMES DAILY AS NEEDED FOR OUTBREAKS 45 tablet 3  . ALPRAZolam (XANAX) 1 MG tablet take 1/2 tablet by mouth daily at noon and take 1/2 tablet at bedtime  (MAY TAKE 1 WHOLE TABLET IF NEEDED, ON OCCASION--30 DAY SUPPLY-) 40 tablet 2  . betamethasone valerate lotion (VALISONE) 0.1 % Apply 1 application topically 2 (two) times daily. Reported on 01/23/2016    . hydrOXYzine (ATARAX/VISTARIL) 25 MG tablet Take 1 tablet (25 mg total) by mouth every 8 (eight) hours as needed for itching. 21 tablet 0  . lisinopril (PRINIVIL,ZESTRIL) 5 MG tablet take 1 tablet by mouth once daily 90 tablet 3  . propranolol (INDERAL) 10 MG tablet Use twice a day as needed for performance anxiety 30 tablet 1  . traMADol (ULTRAM) 50 MG tablet Take 1 tablet (50 mg total) by mouth every 8 (eight) hours as needed. 30 tablet 1  . vardenafil (LEVITRA) 20 MG tablet Take 1 tablet (20 mg total) by mouth daily as needed for erectile dysfunction. 95 tablet 0   No current facility-administered medications on file prior to visit.     Review of Systems:  As per HPI- otherwise negative.   Physical Examination: Vitals:   07/29/17 0850  BP: 140/86  Pulse: 66  Temp: 98 F (36.7 C)   Vitals:   07/29/17 0850  Weight: 176 lb 6.4 oz (80 kg)  Height: 5\' 6"  (1.676 m)   Body mass index is 28.47 kg/m. Ideal Body Weight: Weight in (lb) to have BMI = 25: 154.6  GEN: WDWN, NAD, Non-toxic, A & O x 3, has lost some weight, looks well today HEENT: Atraumatic, Normocephalic. Neck supple. No masses, No LAD. Ears and Nose: No external deformity. CV: RRR, No M/G/R. No JVD. No thrill. No extra heart sounds. PULM: CTA B, no wheezes, crackles, rhonchi. No retractions. No resp. distress. No accessory muscle  use. ABD: S, NT, ND. No rebound. No HSM. EXTR: No c/c/e NEURO Normal gait.  PSYCH: Normally interactive. Conversant. Not depressed or anxious appearing.  Calm demeanor.   EKG: NSR, compared with 2014, no acute change  Assessment and Plan: Hyperlipidemia, unspecified hyperlipidemia type - Plan: Lipid panel  Essential hypertension  Performance anxiety  Benign prostatic hyperplasia with lower urinary tract symptoms, symptom details unspecified - Plan: PSA  Pre-diabetes - Plan: Hemoglobin V4M, Basic metabolic panel  Chronic pain of both knees  Intermittent chest pain - Plan: EKG 12-Lead, Exercise Tolerance Test  Here today for a follow-up visit Labs pending as above He has intermittent knee pain from injuries, but does not need tramadol today He also does not need any xanax at this time- he has plenty remaining Will arrange for an ETT to further eval for intermittent CP He will let me know if any change in these sx and will take asa 81 in the meantime   Signed Lamar Blinks, MD   Received his labs   Results for orders placed or performed in visit on 07/29/17  PSA  Result Value Ref Range   PSA 11.94 (H) 0.10 - 4.00 ng/mL  Lipid panel  Result Value Ref Range   Cholesterol 204 (H) 0 - 200 mg/dL   Triglycerides 92.0 0.0 - 149.0 mg/dL   HDL 42.00 >39.00 mg/dL   VLDL 18.4 0.0 - 40.0 mg/dL   LDL Cholesterol 144 (H) 0 - 99 mg/dL   Total CHOL/HDL Ratio 5    NonHDL 162.35   Hemoglobin A1c  Result Value Ref Range   Hgb A1c MFr Bld 6.3 4.6 - 6.5 %  Basic metabolic panel  Result Value Ref Range   Sodium 140 135 - 145 mEq/L   Potassium 4.0 3.5 - 5.1 mEq/L   Chloride 105 96 - 112 mEq/L   CO2 29 19 - 32 mEq/L   Glucose, Bld 111 (H) 70 - 99 mg/dL   BUN 14 6 - 23 mg/dL   Creatinine, Ser 1.02 0.40 - 1.50 mg/dL   Calcium 9.1 8.4 - 10.5 mg/dL   GFR 78.05 >60.00 mL/min

## 2017-07-29 ENCOUNTER — Encounter: Payer: Self-pay | Admitting: Family Medicine

## 2017-07-29 ENCOUNTER — Ambulatory Visit (INDEPENDENT_AMBULATORY_CARE_PROVIDER_SITE_OTHER): Payer: BLUE CROSS/BLUE SHIELD | Admitting: Family Medicine

## 2017-07-29 VITALS — BP 140/86 | HR 66 | Temp 98.0°F | Ht 66.0 in | Wt 176.4 lb

## 2017-07-29 DIAGNOSIS — I10 Essential (primary) hypertension: Secondary | ICD-10-CM | POA: Diagnosis not present

## 2017-07-29 DIAGNOSIS — E785 Hyperlipidemia, unspecified: Secondary | ICD-10-CM

## 2017-07-29 DIAGNOSIS — N401 Enlarged prostate with lower urinary tract symptoms: Secondary | ICD-10-CM

## 2017-07-29 DIAGNOSIS — R079 Chest pain, unspecified: Secondary | ICD-10-CM

## 2017-07-29 DIAGNOSIS — F418 Other specified anxiety disorders: Secondary | ICD-10-CM

## 2017-07-29 DIAGNOSIS — M25562 Pain in left knee: Secondary | ICD-10-CM | POA: Diagnosis not present

## 2017-07-29 DIAGNOSIS — R7303 Prediabetes: Secondary | ICD-10-CM | POA: Diagnosis not present

## 2017-07-29 DIAGNOSIS — M25561 Pain in right knee: Secondary | ICD-10-CM

## 2017-07-29 DIAGNOSIS — G8929 Other chronic pain: Secondary | ICD-10-CM | POA: Diagnosis not present

## 2017-07-29 LAB — LIPID PANEL
CHOL/HDL RATIO: 5
Cholesterol: 204 mg/dL — ABNORMAL HIGH (ref 0–200)
HDL: 42 mg/dL (ref >39.00)
LDL Cholesterol: 144 mg/dL — ABNORMAL HIGH (ref 0–99)
NONHDL: 162.35
TRIGLYCERIDES: 92 mg/dL (ref 0.0–149.0)
VLDL: 18.4 mg/dL (ref 0.0–40.0)

## 2017-07-29 LAB — BASIC METABOLIC PANEL
BUN: 14 mg/dL (ref 6–23)
CALCIUM: 9.1 mg/dL (ref 8.4–10.5)
CO2: 29 mEq/L (ref 19–32)
Chloride: 105 mEq/L (ref 96–112)
Creatinine, Ser: 1.02 mg/dL (ref 0.40–1.50)
GFR: 78.05 mL/min (ref >60.00)
Glucose, Bld: 111 mg/dL — ABNORMAL HIGH (ref 70–99)
POTASSIUM: 4 meq/L (ref 3.5–5.1)
SODIUM: 140 meq/L (ref 135–145)

## 2017-07-29 LAB — HEMOGLOBIN A1C: HEMOGLOBIN A1C: 6.3 % (ref 4.6–6.5)

## 2017-07-29 LAB — PSA: PSA: 11.94 ng/mL — ABNORMAL HIGH (ref 0.10–4.00)

## 2017-07-29 NOTE — Patient Instructions (Addendum)
It was good to see you again today- great job with losing weight!  I will be in touch with your labs, and we will schedule a treadmill test for you to check your heart.    A baby aspirin once a day can help to protect your heart- 81 mg  Let me know if any change or worsening of your symptoms in the meantime

## 2017-08-05 ENCOUNTER — Telehealth: Payer: Self-pay

## 2017-08-05 NOTE — Telephone Encounter (Signed)
Per Dr. Serita Grit request on 07/29/17 faxed copy of patients PSA results to Dr. Edwena Blow.

## 2017-08-13 ENCOUNTER — Ambulatory Visit (INDEPENDENT_AMBULATORY_CARE_PROVIDER_SITE_OTHER): Payer: BLUE CROSS/BLUE SHIELD

## 2017-08-13 DIAGNOSIS — R079 Chest pain, unspecified: Secondary | ICD-10-CM | POA: Diagnosis not present

## 2017-08-13 LAB — EXERCISE TOLERANCE TEST
CHL CUP MPHR: 156 {beats}/min
CHL CUP RESTING HR STRESS: 70 {beats}/min
CSEPEDS: 30 s
CSEPPHR: 160 {beats}/min
Estimated workload: 7.5 METS
Exercise duration (min): 6 min
Percent HR: 102 %
RPE: 15

## 2017-08-14 ENCOUNTER — Encounter: Payer: Self-pay | Admitting: Family Medicine

## 2017-08-22 ENCOUNTER — Other Ambulatory Visit: Payer: Self-pay | Admitting: Family Medicine

## 2017-08-22 DIAGNOSIS — F411 Generalized anxiety disorder: Secondary | ICD-10-CM

## 2017-08-26 NOTE — Telephone Encounter (Signed)
Pt is requesting refill on alprazolam 1mg .

## 2017-08-26 NOTE — Telephone Encounter (Signed)
He gets alprazolam about once a month Last visit here in August 18 Ok to refill today

## 2017-09-28 ENCOUNTER — Encounter: Payer: Self-pay | Admitting: Family Medicine

## 2018-01-12 ENCOUNTER — Encounter: Payer: Self-pay | Admitting: Family Medicine

## 2018-01-31 NOTE — Progress Notes (Signed)
[place] at [facility] [address], [place], [place] [address] [phone]  Date:  02/03/2018   Name:  John Wagner.   DOB:  05/22/1951   MRN:  [id number]  PCP:  Darreld Mclean, MD    Chief Complaint: Annual Exam   History of Present Illness:  John Peruski. is a 67 y.o. very pleasant male patient who presents with the following:  Here today for a CPE- I last saw him in August He has intermittent knee pain from injuries, but does not need tramadol today He also does not need any xanax at this time- he has plenty remaining Will arrange for an ETT to further eval for intermittent CP He will let me know if any change in these sx and will take asa 81 in the meantime  We got a treadmill test for him which was benign Noted that his PSA had gone up at last visit- sent him to see his urologist Dr. Karsten Ro He reports that his PSA went back down on recheck.  He is not sure what follow-up was planned after that   Flu:  Will do today Colon: 2016  He has noted some pain with his bilateral shoulders. He did a lot of bagpiping over the last few months, and has been doing some home improvements and has been raking leaves- using his shoulders a lot.   He is no longer able to sleep on either shoulder- his shoulders hurt more at night.  Last night he had to get up and take ibuprofen Sleeping on his back makes him snore  He first noticed his shoulders hurting so badly in late December/ early January.   He stopped working on the house and stopped bag piping.  Things are a lot better since he backed down on these activities   Never had any shoulder issues in the past.  His knees are doing ok right now  He got labs about 8 months ago  He broke up with his GF last fall and is still kind of sad from this. He is using his xanax about once a day  NCCSR:  Last entry from July. May not be UTD Nothing concerning however   Patient Active  Problem List   Diagnosis Date Noted  . Pre-diabetes 01/24/2016  . BPH (benign prostatic hyperplasia) 12/25/2014  . Erectile dysfunction 03/10/2014  . Anxiety 12/06/2012  . Psoriasis 12/06/2012  . Hypertension   . Hyperlipidemia     Past Medical History:  Diagnosis Date  . Anxiety   . Hyperlipidemia   . Hypertension   . Psoriasis     Past Surgical History:  Procedure Laterality Date  . APPENDECTOMY      Social History   Tobacco Use  . Smoking status: Former Research scientist (life sciences)  . Smokeless tobacco: Never Used  Substance Use Topics  . Alcohol use: Yes  . Drug use: No    Family History  Problem Relation Age of Onset  . Cancer Father        prostate cancer  . Hypertension Mother   . Heart disease Mother     Allergies  Allergen Reactions  . Buspar [Buspirone]     CNS  . Statins     myalgias    Medication list has been reviewed and updated.  Current Outpatient Medications on File Prior to Visit  Medication Sig Dispense Refill  . acyclovir (ZOVIRAX) 400 MG tablet TAKE 1 TABLET BY  MOUTH THREE TIMES DAILY AS NEEDED FOR OUTBREAKS 45 tablet 3  . betamethasone valerate lotion (VALISONE) 0.1 % Apply 1 application topically 2 (two) times daily. Reported on 01/23/2016    . hydrOXYzine (ATARAX/VISTARIL) 25 MG tablet Take 1 tablet (25 mg total) by mouth every 8 (eight) hours as needed for itching. 21 tablet 0  . lisinopril (PRINIVIL,ZESTRIL) 5 MG tablet take 1 tablet by mouth once daily 90 tablet 3  . vardenafil (LEVITRA) 20 MG tablet Take 1 tablet (20 mg total) by mouth daily as needed for erectile dysfunction. 95 tablet 0   No current facility-administered medications on file prior to visit.     Review of Systems:  As per HPI- otherwise negative.   Physical Examination: Vitals:   02/03/18 0902  BP: 132/88  Pulse: 73  Resp: 16  Temp: 97.7 F (36.5 C)  SpO2: 96%   Vitals:   02/03/18 0902  Weight: 180 lb 9.6 oz (81.9 kg)  Height: 5\' 6"  (1.676 m)   Body mass index is  29.15 kg/m. Ideal Body Weight: Weight in (lb) to have BMI = 25: 154.6  GEN: WDWN, NAD, Non-toxic, A & O x 3, overweight, looks well  HEENT: Atraumatic, Normocephalic. Neck supple. No masses, No LAD.  Bilateral TM wnl, oropharynx normal.  PEERL,EOMI.   Ears and Nose: No external deformity. CV: RRR, No M/G/R. No JVD. No thrill. No extra heart sounds. PULM: CTA B, no wheezes, crackles, rhonchi. No retractions. No resp. distress. No accessory muscle use. ABD: S, NT, ND EXTR: No c/c/e NEURO Normal gait.   Normal DTR all extremities  PSYCH: Normally interactive. Conversant. Not depressed or anxious appearing.  Calm demeanor.  Bilateral shoulders- normal ROM. He has pain with internal and external ROM and cannot push my hand off his back in internal rotation.  However strength of his shoulders is otherwise normal, negative empty can testing.  Suspect RCT strain but not tear Left is worse than right    Assessment and Plan: Chronic pain of both shoulders - Plan: Ambulatory referral to Physical Therapy  GAD (generalized anxiety disorder) - Plan: propranolol (INDERAL) 10 MG tablet, ALPRAZolam (XANAX) 1 MG tablet  Performance anxiety - Plan: propranolol (INDERAL) 10 MG tablet  Chronic knee pain, unspecified laterality - Plan: traMADol (ULTRAM) 50 MG tablet  Medication monitoring encounter - Plan: Basic metabolic panel  Physical exam  Needs flu shot - Plan: Flu Vaccine QUAD 6+ mos PF IM (Fluarix Quad PF)  Here today for a follow-up visit/ physical Flu shot today Labs pending as above Refilled his medications for anxiety and for pain Referral to PT for his shoulders.  Discussed safe shoulder use- avoid any overhead lifting or reaching as much as possible   Signed Lamar Blinks, MD  Received his labs Results for orders placed or performed in visit on 32/20/25  Basic metabolic panel  Result Value Ref Range   Sodium 141 135 - 145 mEq/L   Potassium 4.6 3.5 - 5.1 mEq/L   Chloride 104  96 - 112 mEq/L   CO2 31 19 - 32 mEq/L   Glucose, Bld 127 (H) 70 - 99 mg/dL   BUN 17 6 - 23 mg/dL   Creatinine, Ser 0.96 0.40 - 1.50 mg/dL   Calcium 9.3 8.4 - 10.5 mg/dL   GFR 83.57 >60.00 mL/min   Lab Results  Component Value Date   HGBA1C 6.3 07/29/2017

## 2018-02-03 ENCOUNTER — Encounter: Payer: Self-pay | Admitting: Family Medicine

## 2018-02-03 ENCOUNTER — Ambulatory Visit (INDEPENDENT_AMBULATORY_CARE_PROVIDER_SITE_OTHER): Payer: BLUE CROSS/BLUE SHIELD | Admitting: Family Medicine

## 2018-02-03 VITALS — BP 132/88 | HR 73 | Temp 97.7°F | Resp 16 | Ht 66.0 in | Wt 180.6 lb

## 2018-02-03 DIAGNOSIS — M25512 Pain in left shoulder: Secondary | ICD-10-CM | POA: Diagnosis not present

## 2018-02-03 DIAGNOSIS — G8929 Other chronic pain: Secondary | ICD-10-CM

## 2018-02-03 DIAGNOSIS — F418 Other specified anxiety disorders: Secondary | ICD-10-CM

## 2018-02-03 DIAGNOSIS — Z23 Encounter for immunization: Secondary | ICD-10-CM

## 2018-02-03 DIAGNOSIS — F411 Generalized anxiety disorder: Secondary | ICD-10-CM

## 2018-02-03 DIAGNOSIS — Z5181 Encounter for therapeutic drug level monitoring: Secondary | ICD-10-CM | POA: Diagnosis not present

## 2018-02-03 DIAGNOSIS — M25511 Pain in right shoulder: Secondary | ICD-10-CM | POA: Diagnosis not present

## 2018-02-03 DIAGNOSIS — Z0000 Encounter for general adult medical examination without abnormal findings: Secondary | ICD-10-CM

## 2018-02-03 DIAGNOSIS — M25569 Pain in unspecified knee: Secondary | ICD-10-CM | POA: Diagnosis not present

## 2018-02-03 DIAGNOSIS — Z0001 Encounter for general adult medical examination with abnormal findings: Secondary | ICD-10-CM

## 2018-02-03 LAB — BASIC METABOLIC PANEL
BUN: 17 mg/dL (ref 6–23)
CALCIUM: 9.3 mg/dL (ref 8.4–10.5)
CO2: 31 mEq/L (ref 19–32)
Chloride: 104 mEq/L (ref 96–112)
Creatinine, Ser: 0.96 mg/dL (ref 0.40–1.50)
GFR: 83.57 mL/min (ref >60.00)
GLUCOSE: 127 mg/dL — AB (ref 70–99)
POTASSIUM: 4.6 meq/L (ref 3.5–5.1)
SODIUM: 141 meq/L (ref 135–145)

## 2018-02-03 MED ORDER — ALPRAZOLAM 1 MG PO TABS: ORAL_TABLET | ORAL | 3 refills | Status: DC

## 2018-02-03 MED ORDER — PROPRANOLOL HCL 10 MG PO TABS: ORAL_TABLET | ORAL | 3 refills | Status: DC

## 2018-02-03 MED ORDER — TRAMADOL HCL 50 MG PO TABS: 50.0000 mg | ORAL_TABLET | Freq: Three times a day (TID) | ORAL | 1 refills | Status: DC | PRN

## 2018-02-03 NOTE — Patient Instructions (Signed)
It was nice to see you today- take care and I will be in touch with your BMP asap You got your flu shot today Please see me in about 6 months  We are getting your set up with physical therapy for your shoulders.  Try to avoid reaching overhead or lifting anything overhead as this will be tough on your shoulders!     Health Maintenance, Male A healthy lifestyle and preventive care is important for your health and wellness. Ask your health care provider about what schedule of regular examinations is right for you. What should I know about weight and diet? Eat a Healthy Diet  Eat plenty of vegetables, fruits, whole grains, low-fat dairy products, and lean protein.  Do not eat a lot of foods high in solid fats, added sugars, or salt.  Maintain a Healthy Weight Regular exercise can help you achieve or maintain a healthy weight. You should:  Do at least 150 minutes of exercise each week. The exercise should increase your heart rate and make you sweat (moderate-intensity exercise).  Do strength-training exercises at least twice a week.  Watch Your Levels of Cholesterol and Blood Lipids  Have your blood tested for lipids and cholesterol every 5 years starting at 65 years of age. If you are at high risk for heart disease, you should start having your blood tested when you are 65 years old. You may need to have your cholesterol levels checked more often if: ? Your lipid or cholesterol levels are high. ? You are older than 65 years of age. ? You are at high risk for heart disease.  What should I know about cancer screening? Many types of cancers can be detected early and may often be prevented. Lung Cancer  You should be screened every year for lung cancer if: ? You are a current smoker who has smoked for at least 30 years. ? You are a former smoker who has quit within the past 15 years.  Talk to your health care provider about your screening options, when you should start screening, and  how often you should be screened.  Colorectal Cancer  Routine colorectal cancer screening usually begins at 65 years of age and should be repeated every 5-10 years until you are 65 years old. You may need to be screened more often if early forms of precancerous polyps or small growths are found. Your health care provider may recommend screening at an earlier age if you have risk factors for colon cancer.  Your health care provider may recommend using home test kits to check for hidden blood in the stool.  A small camera at the end of a tube can be used to examine your colon (sigmoidoscopy or colonoscopy). This checks for the earliest forms of colorectal cancer.  Prostate and Testicular Cancer  Depending on your age and overall health, your health care provider may do certain tests to screen for prostate and testicular cancer.  Talk to your health care provider about any symptoms or concerns you have about testicular or prostate cancer.  Skin Cancer  Check your skin from head to toe regularly.  Tell your health care provider about any new moles or changes in moles, especially if: ? There is a change in a mole's size, shape, or color. ? You have a mole that is larger than a pencil eraser.  Always use sunscreen. Apply sunscreen liberally and repeat throughout the day.  Protect yourself by wearing long sleeves, pants, a wide-brimmed hat, and  sunglasses when outside.  What should I know about heart disease, diabetes, and high blood pressure?  If you are 63-40 years of age, have your blood pressure checked every 3-5 years. If you are 74 years of age or older, have your blood pressure checked every year. You should have your blood pressure measured twice-once when you are at a hospital or clinic, and once when you are not at a hospital or clinic. Record the average of the two measurements. To check your blood pressure when you are not at a hospital or clinic, you can use: ? An automated blood  pressure machine at a pharmacy. ? A home blood pressure monitor.  Talk to your health care provider about your target blood pressure.  If you are between 59-22 years old, ask your health care provider if you should take aspirin to prevent heart disease.  Have regular diabetes screenings by checking your fasting blood sugar level. ? If you are at a normal weight and have a low risk for diabetes, have this test once every three years after the age of 55. ? If you are overweight and have a high risk for diabetes, consider being tested at a younger age or more often.  A one-time screening for abdominal aortic aneurysm (AAA) by ultrasound is recommended for men aged 59-75 years who are current or former smokers. What should I know about preventing infection? Hepatitis B If you have a higher risk for hepatitis B, you should be screened for this virus. Talk with your health care provider to find out if you are at risk for hepatitis B infection. Hepatitis C Blood testing is recommended for:  Everyone born from [birth year] through 1963.  Anyone with known risk factors for hepatitis C.  Sexually Transmitted Diseases (STDs)  You should be screened each year for STDs including gonorrhea and chlamydia if: ? You are sexually active and are younger than 65 years of age. ? You are older than 65 years of age and your health care provider tells you that you are at risk for this type of infection. ? Your sexual activity has changed since you were last screened and you are at an increased risk for chlamydia or gonorrhea. Ask your health care provider if you are at risk.  Talk with your health care provider about whether you are at high risk of being infected with HIV. Your health care provider may recommend a prescription medicine to help prevent HIV infection.  What else can I do?  Schedule regular health, dental, and eye exams.  Stay current with your vaccines (immunizations).  Do not use any tobacco  products, such as cigarettes, chewing tobacco, and e-cigarettes. If you need help quitting, ask your health care provider.  Limit alcohol intake to no more than 2 drinks per day. One drink equals 12 ounces of beer, 5 ounces of wine, or 1 ounces of hard liquor.  Do not use street drugs.  Do not share needles.  Ask your health care provider for help if you need support or information about quitting drugs.  Tell your health care provider if you often feel depressed.  Tell your health care provider if you have ever been abused or do not feel safe at home. This information is not intended to replace advice given to you by your health care provider. Make sure you discuss any questions you have with your health care provider. Document Released: 06/05/2008 Document Revised: 08/06/2016 Document Reviewed: 09/11/2015 Elsevier Interactive Patient Education  2018  [employer].

## 2018-02-17 ENCOUNTER — Other Ambulatory Visit: Payer: Self-pay | Admitting: Family Medicine

## 2018-06-25 DIAGNOSIS — R972 Elevated prostate specific antigen [PSA]: Secondary | ICD-10-CM | POA: Diagnosis not present

## 2018-06-28 DIAGNOSIS — N400 Enlarged prostate without lower urinary tract symptoms: Secondary | ICD-10-CM | POA: Diagnosis not present

## 2018-06-28 DIAGNOSIS — R972 Elevated prostate specific antigen [PSA]: Secondary | ICD-10-CM | POA: Diagnosis not present

## 2018-08-03 NOTE — Progress Notes (Addendum)
[place] at [facility] [address] [place], [place] [address] [phone]  Date:  08/04/2018   Name:  John Wagner.   DOB:  Apr 03, 1951   MRN:  [id number]  PCP:  John Mclean, MD    Chief Complaint: Shoulder Pain (6 month follow up, right shoulder worse than left, not healing) and Hypertension (needing refills)   History of Present Illness:  John Wagner. is a 67 y.o. very pleasant male patient who presents with the following:  6 month follow-up visit today History of pre-diabetes, anxiety, HTN, hyperlipidemia , ED Last seen here in February:  We got a treadmill test for him which was benign Noted that his PSA had gone up at last visit- sent him to see his urologist Dr. Karsten Ro He reports that his PSA went back down on recheck.  He is not sure what follow-up was planned after that  He has noted some pain with his bilateral shoulders. He did a lot of bagpiping over the last few months, and has been doing some home improvements and has been raking leaves- using his shoulders a lot.   He is no longer able to sleep on either shoulder- his shoulders hurt more at night.  Last night he had to get up and take ibuprofen Sleeping on his back makes him snore  He first noticed his shoulders hurting so badly in late December/ early January.   He stopped working on the house and stopped bag piping.  Things are a lot better since he backed down on these activities  He broke up with his GF last fall and is still kind of sad from this. He is using his xanax about once a day  He is now 67- can start pneumonia series for him, he would like to do this  Due for full labs today- he is fasting today  BP Readings from Last 3 Encounters:  08/04/18 136/86  02/03/18 132/88  07/29/17 140/86   Xanax Lisinopril 5 Inderal 10 prn levitra prn Tramadol prn   He has been to see urology for his elevated PSA- he had a lab drawn prior to his  recent visit with them in July. However his lab was not back at the time of this visit and he never heard anything. He would like me to draw a PSA today which is fine. The plan is for him to see urology/ do PSA q 6 months for the time being   Pt notes that in February he did do some PT per his brother per his shoulders- his is a therapist and helped him with some exercises , they did about 5 sessions  His left arm/ shoulder is now ok However the right shoulder is still bothersome. 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- "the problem is that I'm doing a job designed for a 67 yo" Playing the bagpipes for an extended period is still painful - he performs regularly still, did a wedding and a funeral last weekend  He is using tramadol prn for his shoulder- he uses this about 3x a week He is using alprazolam as he always does- 1/2 tab am and 1/2 tab pm   NCCSR:  07/30/2018  1  02/03/2018  Tramadol Hcl 50 Mg  Tablet  9.00 3 Je Cop  [id number]  Wal [phone])  2/1 15.00 MME Comm Ins  [place]  05/25/2018  1  02/03/2018  Alprazolam 1 Mg Tablet  40.00 30 Je Cop  [id number]  Wal [phone])  2/2 2.67 LME Comm Ins  [employer]  03/27/2018  1  02/03/2018  Alprazolam 1 Mg Tablet  40.00 30 Je Cop  [id number]  Wal [phone])  1/2 2.67 LME Comm Ins  [place]  03/27/2018  1  02/03/2018  Tramadol Hcl 50 Mg Tablet  21.00 7 Je Cop  [id number]  Wal [phone])  1/1 15.00 MME Comm Ins  [place]  02/03/2018  1  02/03/2018  Alprazolam 1 Mg Tablet  40.00 30 Je Cop  [id number]  Wal [phone])  1/3 2.67 LME Comm Ins  [place]  02/03/2018  1  02/03/2018  Tramadol Hcl 50 Mg Tablet  21.00 7 Je Cop  [id number]  Wal [phone])          Patient Active Problem List   Diagnosis Date Noted  . Pre-diabetes 01/24/2016  . BPH (benign prostatic hyperplasia) 12/25/2014  . Erectile dysfunction 03/10/2014  . Anxiety 12/06/2012  . Psoriasis  12/06/2012  . Hypertension   . Hyperlipidemia     Past Medical History:  Diagnosis Date  . Anxiety   . Hyperlipidemia   . Hypertension   . Psoriasis     Past Surgical History:  Procedure Laterality Date  . APPENDECTOMY      Social History   Tobacco Use  . Smoking status: Former Research scientist (life sciences)  . Smokeless tobacco: Never Used  Substance Use Topics  . Alcohol use: Yes  . Drug use: No    Family History  Problem Relation Age of Onset  . Cancer Father        prostate cancer  . Hypertension Mother   . Heart disease Mother     Allergies  Allergen Reactions  . Buspar [Buspirone]     CNS  . Statins     myalgias    Medication list has been reviewed and updated.  Current Outpatient Medications on File Prior to Visit  Medication Sig Dispense Refill  . acyclovir (ZOVIRAX) 400 MG tablet TAKE 1 TABLET BY MOUTH THREE TIMES DAILY AS NEEDED FOR OUTBREAKS 45 tablet 3  . betamethasone valerate lotion (VALISONE) 0.1 % Apply 1 application topically 2 (two) times daily. Reported on 01/23/2016    . hydrOXYzine (ATARAX/VISTARIL) 25 MG tablet Take 1 tablet (25 mg total) by mouth every 8 (eight) hours as needed for itching. 21 tablet 0  . propranolol (INDERAL) 10 MG tablet Use twice a day as needed for performance anxiety 30 tablet 3  . vardenafil (LEVITRA) 20 MG tablet Take 1 tablet (20 mg total) by mouth daily as needed for erectile dysfunction. 95 tablet 0   No current facility-administered medications on file prior to visit.     Review of Systems:  As per HPI- otherwise negative. No fever or chills No CP or SOB   Physical Examination: Vitals:   08/04/18 0829  BP: 136/86  Pulse: 71  Resp: 16  Temp: 98.2 F (36.8 C)  SpO2: 98%   Vitals:   08/04/18 0829  Weight: 180 lb 6.4 oz (81.8 kg)  Height: 5\' 6"  (1.676 m)   Body mass index is 29.12 kg/m. Ideal Body Weight: Weight in (lb) to have BMI = 25: 154.6  GEN: WDWN, NAD, Non-toxic, A & O x 3, overweight, looks well   HEENT:  Atraumatic, Normocephalic. Neck supple.  No masses, No LAD.  Bilateral TM wnl, oropharynx normal.  PEERL,EOMI.   Ears and Nose: No external deformity. CV: RRR, No M/G/R. No JVD. No thrill. No extra heart sounds. PULM: CTA B, no wheezes, crackles, rhonchi. No retractions. No resp. distress. No accessory muscle use. EXTR: No c/c/e NEURO Normal gait.  PSYCH: Normally interactive. Conversant. Not depressed or anxious appearing.  Calm demeanor.  Right shoulder- he does have weakness in resisted internal rotation (lifting hand off his back)- otherwise strength is normal  ROM is normal Negative empty an test Negative impingement testing   Assessment and Plan: GAD (generalized anxiety disorder) - Plan: ALPRAZolam (XANAX) 1 MG tablet  Hyperlipidemia, unspecified hyperlipidemia type - Plan: Lipid panel  Essential hypertension - Plan: CBC, Comprehensive metabolic panel, lisinopril (PRINIVIL,ZESTRIL) 5 MG tablet  Pre-diabetes - Plan: Comprehensive metabolic panel, Hemoglobin A1c  Benign prostatic hyperplasia without lower urinary tract symptoms - Plan: PSA  Immunization due - Plan: Pneumococcal conjugate vaccine 13-valent IM  Chronic right shoulder pain - Plan: DG Shoulder Right, traMADol (ULTRAM) 50 MG tablet  Chronic knee pain, unspecified laterality - Plan: traMADol (ULTRAM) 50 MG tablet  Need for pneumococcal vaccination  Following up today  Labs and refills as above Obtain right shoulder films today He feels like his right shoulder is getting better and does not want any referral today- he will let me know if he changes his mind about this  See patient instructions for more details.   Will plan further follow- up pending labs.   Signed Lamar Blinks, MD  Received his labs, message to pt  Results for orders placed or performed in visit on 08/04/18  CBC  Result Value Ref Range   WBC 5.0 4.0 - 10.5 K/uL   RBC 5.13 4.22 - 5.81 Mil/uL   Platelets 352.0 150.0 - 400.0 K/uL    Hemoglobin 14.8 13.0 - 17.0 g/dL   HCT 44.6 39.0 - 52.0 %   MCV 86.9 78.0 - 100.0 fl   MCHC 33.1 30.0 - 36.0 g/dL   RDW 14.4 11.5 - 15.5 %  Comprehensive metabolic panel  Result Value Ref Range   Sodium 141 135 - 145 mEq/L   Potassium 4.5 3.5 - 5.1 mEq/L   Chloride 105 96 - 112 mEq/L   CO2 30 19 - 32 mEq/L   Glucose, Bld 108 (H) 70 - 99 mg/dL   BUN 12 6 - 23 mg/dL   Creatinine, Ser 1.04 0.40 - 1.50 mg/dL   Total Bilirubin 0.8 0.2 - 1.2 mg/dL   Alkaline Phosphatase 44 39 - 117 U/L   AST 15 0 - 37 U/L   ALT 17 0 - 53 U/L   Total Protein 6.6 6.0 - 8.3 g/dL   Albumin 4.3 3.5 - 5.2 g/dL   Calcium 9.3 8.4 - 10.5 mg/dL   GFR 76.07 >60.00 mL/min  Hemoglobin A1c  Result Value Ref Range   Hgb A1c MFr Bld 6.1 4.6 - 6.5 %  Lipid panel  Result Value Ref Range   Cholesterol 234 (H) 0 - 200 mg/dL   Triglycerides 131.0 0.0 - 149.0 mg/dL   HDL 48.90 >39.00 mg/dL   VLDL 26.2 0.0 - 40.0 mg/dL   LDL Cholesterol 159 (H) 0 - 99 mg/dL   Total CHOL/HDL Ratio 5    NonHDL 184.80   PSA  Result Value Ref Range   PSA 8.04 (H) 0.10 - 4.00 ng/mL   Blood counts are normal Metabolic profile is ok L3Y is in the pre-diabetes  range still, better from last year Your cholesterol is about the same overall.  Would you have any interest in trying a lower dose of a different statin? We may be able to find something that you can tolerate.  If you would like to try this, can you please remind me of what statins you may have tried in the past? PSA as above- however I don't know what it was at your urology appt last month.  If you would, please give them a call and find out.  If your current number is higher please alert me!

## 2018-08-04 ENCOUNTER — Encounter: Payer: Self-pay | Admitting: Family Medicine

## 2018-08-04 ENCOUNTER — Ambulatory Visit (INDEPENDENT_AMBULATORY_CARE_PROVIDER_SITE_OTHER): Payer: Medicare Other | Admitting: Family Medicine

## 2018-08-04 ENCOUNTER — Ambulatory Visit (HOSPITAL_BASED_OUTPATIENT_CLINIC_OR_DEPARTMENT_OTHER)
Admission: RE | Admit: 2018-08-04 | Discharge: 2018-08-04 | Disposition: A | Discharge status: Home / Self Care | Payer: Medicare Other | Source: Ambulatory Visit | Attending: Family Medicine | Admitting: Family Medicine

## 2018-08-04 VITALS — BP 136/86 | HR 71 | Temp 98.2°F | Resp 16 | Ht 66.0 in | Wt 180.4 lb

## 2018-08-04 DIAGNOSIS — Z23 Encounter for immunization: Secondary | ICD-10-CM | POA: Diagnosis not present

## 2018-08-04 DIAGNOSIS — M25511 Pain in right shoulder: Secondary | ICD-10-CM | POA: Insufficient documentation

## 2018-08-04 DIAGNOSIS — G8929 Other chronic pain: Secondary | ICD-10-CM | POA: Diagnosis not present

## 2018-08-04 DIAGNOSIS — R7303 Prediabetes: Secondary | ICD-10-CM

## 2018-08-04 DIAGNOSIS — M25569 Pain in unspecified knee: Secondary | ICD-10-CM

## 2018-08-04 DIAGNOSIS — I10 Essential (primary) hypertension: Secondary | ICD-10-CM | POA: Diagnosis not present

## 2018-08-04 DIAGNOSIS — E785 Hyperlipidemia, unspecified: Secondary | ICD-10-CM

## 2018-08-04 DIAGNOSIS — F411 Generalized anxiety disorder: Secondary | ICD-10-CM

## 2018-08-04 DIAGNOSIS — N400 Enlarged prostate without lower urinary tract symptoms: Secondary | ICD-10-CM | POA: Diagnosis not present

## 2018-08-04 LAB — CBC
HEMATOCRIT: 44.6 % (ref 39.0–52.0)
HEMOGLOBIN: 14.8 g/dL (ref 13.0–17.0)
MCHC: 33.1 g/dL (ref 30.0–36.0)
MCV: 86.9 fl (ref 78.0–100.0)
PLATELETS: 352 10*3/uL (ref 150.0–400.0)
RBC: 5.13 Mil/uL (ref 4.22–5.81)
RDW: 14.4 % (ref 11.5–15.5)
WBC: 5 10*3/uL (ref 4.0–10.5)

## 2018-08-04 LAB — COMPREHENSIVE METABOLIC PANEL
ALBUMIN: 4.3 g/dL (ref 3.5–5.2)
ALK PHOS: 44 U/L (ref 39–117)
ALT: 17 U/L (ref 0–53)
AST: 15 U/L (ref 0–37)
BUN: 12 mg/dL (ref 6–23)
CHLORIDE: 105 meq/L (ref 96–112)
CO2: 30 mEq/L (ref 19–32)
Calcium: 9.3 mg/dL (ref 8.4–10.5)
Creatinine, Ser: 1.04 mg/dL (ref 0.40–1.50)
GFR: 76.07 mL/min (ref >60.00)
Glucose, Bld: 108 mg/dL — ABNORMAL HIGH (ref 70–99)
POTASSIUM: 4.5 meq/L (ref 3.5–5.1)
Sodium: 141 mEq/L (ref 135–145)
Total Bilirubin: 0.8 mg/dL (ref 0.2–1.2)
Total Protein: 6.6 g/dL (ref 6.0–8.3)

## 2018-08-04 LAB — LIPID PANEL
Cholesterol: 234 mg/dL — ABNORMAL HIGH (ref 0–200)
HDL: 48.9 mg/dL (ref >39.00)
LDL Cholesterol: 159 mg/dL — ABNORMAL HIGH (ref 0–99)
NonHDL: 184.8
Total CHOL/HDL Ratio: 5
Triglycerides: 131 mg/dL (ref 0.0–149.0)
VLDL: 26.2 mg/dL (ref 0.0–40.0)

## 2018-08-04 LAB — PSA: PSA: 8.04 ng/mL — ABNORMAL HIGH (ref 0.10–4.00)

## 2018-08-04 LAB — HEMOGLOBIN A1C: HEMOGLOBIN A1C: 6.1 % (ref 4.6–6.5)

## 2018-08-04 MED ORDER — LISINOPRIL 5 MG PO TABS: 5.0000 mg | ORAL_TABLET | Freq: Every day | ORAL | 3 refills | Status: DC

## 2018-08-04 MED ORDER — TRAMADOL HCL 50 MG PO TABS: 50.0000 mg | ORAL_TABLET | Freq: Three times a day (TID) | ORAL | 2 refills | Status: DC | PRN

## 2018-08-04 MED ORDER — ALPRAZOLAM 1 MG PO TABS: ORAL_TABLET | ORAL | 3 refills | Status: DC

## 2018-08-04 NOTE — Patient Instructions (Addendum)
Good to see you again today!  I will be in touch with your labs You might wish to get the Shingrix vaccine series to help protect against shingles- can be done at major drug store at your convenience   You got your first pneumonia vaccine today- we will do your 2nd shot in one year Let me know if you do wish to do any formal PT or see sports med/ortho for your shoulder After your blood draw please go to the ground floor for your right shoulder x-ray

## 2018-09-02 DIAGNOSIS — D225 Melanocytic nevi of trunk: Secondary | ICD-10-CM | POA: Diagnosis not present

## 2018-09-02 DIAGNOSIS — L821 Other seborrheic keratosis: Secondary | ICD-10-CM | POA: Diagnosis not present

## 2018-09-02 DIAGNOSIS — L814 Other melanin hyperpigmentation: Secondary | ICD-10-CM | POA: Diagnosis not present

## 2018-09-02 DIAGNOSIS — L400 Psoriasis vulgaris: Secondary | ICD-10-CM | POA: Diagnosis not present

## 2018-12-30 DIAGNOSIS — R972 Elevated prostate specific antigen [PSA]: Secondary | ICD-10-CM | POA: Diagnosis not present
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
## 2019-02-09 ENCOUNTER — Encounter: Payer: Self-pay | Admitting: Family Medicine

## 2019-02-09 ENCOUNTER — Ambulatory Visit (INDEPENDENT_AMBULATORY_CARE_PROVIDER_SITE_OTHER): Payer: Medicare Other | Admitting: Family Medicine

## 2019-02-09 VITALS — BP 136/90 | HR 63 | Temp 98.1°F | Resp 16 | Ht 66.0 in | Wt 176.0 lb

## 2019-02-09 DIAGNOSIS — M25511 Pain in right shoulder: Secondary | ICD-10-CM

## 2019-02-09 DIAGNOSIS — E785 Hyperlipidemia, unspecified: Secondary | ICD-10-CM | POA: Diagnosis not present

## 2019-02-09 DIAGNOSIS — Z0000 Encounter for general adult medical examination without abnormal findings: Secondary | ICD-10-CM | POA: Diagnosis not present

## 2019-02-09 DIAGNOSIS — Z23 Encounter for immunization: Secondary | ICD-10-CM | POA: Diagnosis not present

## 2019-02-09 DIAGNOSIS — I10 Essential (primary) hypertension: Secondary | ICD-10-CM

## 2019-02-09 DIAGNOSIS — R972 Elevated prostate specific antigen [PSA]: Secondary | ICD-10-CM

## 2019-02-09 DIAGNOSIS — F411 Generalized anxiety disorder: Secondary | ICD-10-CM | POA: Diagnosis not present

## 2019-02-09 DIAGNOSIS — G8929 Other chronic pain: Secondary | ICD-10-CM | POA: Diagnosis not present

## 2019-02-09 DIAGNOSIS — M25569 Pain in unspecified knee: Secondary | ICD-10-CM | POA: Diagnosis not present

## 2019-02-09 DIAGNOSIS — R7303 Prediabetes: Secondary | ICD-10-CM | POA: Diagnosis not present

## 2019-02-09 LAB — HEMOGLOBIN A1C: Hgb A1c MFr Bld: 6.1 % (ref 4.6–6.5)

## 2019-02-09 LAB — PSA: PSA: 7.66 ng/mL — AB (ref 0.10–4.00)

## 2019-02-09 LAB — LIPID PANEL
CHOL/HDL RATIO: 5
Cholesterol: 193 mg/dL (ref 0–200)
HDL: 39.6 mg/dL (ref >39.00)
LDL Cholesterol: 134 mg/dL — ABNORMAL HIGH (ref 0–99)
NONHDL: 152.96
Triglycerides: 93 mg/dL (ref 0.0–149.0)
VLDL: 18.6 mg/dL (ref 0.0–40.0)

## 2019-02-09 MED ORDER — TRAMADOL HCL 50 MG PO TABS: 50.0000 mg | ORAL_TABLET | Freq: Three times a day (TID) | ORAL | 2 refills | Status: DC | PRN

## 2019-02-09 MED ORDER — ALPRAZOLAM 1 MG PO TABS: ORAL_TABLET | ORAL | 3 refills | Status: DC

## 2019-02-09 NOTE — Patient Instructions (Addendum)
I will be in touch with your labs asap, will fax a copy of your PSA to Dr. Karsten Ro  The pleasure to see you today as always, hope you have a wonderful trip to [place] got your flu shot today, you can have the Shingrix series done at your drugstore at your convenience. This is a 2 shot series, with 2 doses given 2 to 6 months apart  Please do work on getting more exercise, you have done a great job with your weight  We may need to think about increasing your lisinopril dose.  If you would, please check your blood pressure a few times over the next month and send me a message with readings   Health Maintenance After Age 73 After age 38, you are at a higher risk for certain long-term diseases and infections as well as injuries from falls. Falls are a major cause of broken bones and head injuries in people who are older than age 58. Getting regular preventive care can help to keep you healthy and well. Preventive care includes getting regular testing and making lifestyle changes as recommended by your health care provider. Talk with your health care provider about:  Which screenings and tests you should have. A screening is a test that checks for a disease when you have no symptoms.  A diet and exercise plan that is right for you. What should I know about screenings and tests to prevent falls? Screening and testing are the best ways to find a health problem early. Early diagnosis and treatment give you the best chance of managing medical conditions that are common after age 50. Certain conditions and lifestyle choices may make you more likely to have a fall. Your health care provider may recommend:  Regular vision checks. Poor vision and conditions such as cataracts can make you more likely to have a fall. If you wear glasses, make sure to get your prescription updated if your vision changes.  Medicine review. Work with your health care provider to regularly review all of the medicines you are  taking, including over-the-counter medicines. Ask your health care provider about any side effects that may make you more likely to have a fall. Tell your health care provider if any medicines that you take make you feel dizzy or sleepy.  Osteoporosis screening. Osteoporosis is a condition that causes the bones to get weaker. This can make the bones weak and cause them to break more easily.  Blood pressure screening. Blood pressure changes and medicines to control blood pressure can make you feel dizzy.  Strength and balance checks. Your health care provider may recommend certain tests to check your strength and balance while standing, walking, or changing positions.  Foot health exam. Foot pain and numbness, as well as not wearing proper footwear, can make you more likely to have a fall.  Depression screening. You may be more likely to have a fall if you have a fear of falling, feel emotionally low, or feel unable to do activities that you used to do.  Alcohol use screening. Using too much alcohol can affect your balance and may make you more likely to have a fall. What actions can I take to lower my risk of falls? General instructions  Talk with your health care provider about your risks for falling. Tell your health care provider if: ? You fall. Be sure to tell your health care provider about all falls, even ones that seem minor. ? You feel dizzy, sleepy, or  off-balance.  Take over-the-counter and prescription medicines only as told by your health care provider. These include any supplements.  Eat a healthy diet and maintain a healthy weight. A healthy diet includes low-fat dairy products, low-fat (lean) meats, and fiber from whole grains, beans, and lots of fruits and vegetables. Home safety  Remove any tripping hazards, such as rugs, cords, and clutter.  Install safety equipment such as grab bars in bathrooms and safety rails on stairs.  Keep rooms and walkways  well-lit. Activity   Follow a regular exercise program to stay fit. This will help you maintain your balance. Ask your health care provider what types of exercise are appropriate for you.  If you need a cane or walker, use it as recommended by your health care provider.  Wear supportive shoes that have nonskid soles. Lifestyle  Do not drink alcohol if your health care provider tells you not to drink.  If you drink alcohol, limit how much you have: ? 0-1 drink a day for women. ? 0-2 drinks a day for men.  Be aware of how much alcohol is in your drink. In the [place] one drink equals one typical bottle of beer (12 oz), one-half glass of wine (5 oz), or one shot of hard liquor (1 oz).  Do not use any products that contain nicotine or tobacco, such as cigarettes and e-cigarettes. If you need help quitting, ask your health care provider. Summary  Having a healthy lifestyle and getting preventive care can help to protect your health and wellness after age 31.  Screening and testing are the best way to find a health problem early and help you avoid having a fall. Early diagnosis and treatment give you the best chance for managing medical conditions that are more common for people who are older than age 65.  Falls are a major cause of broken bones and head injuries in people who are older than age 46. Take precautions to prevent a fall at home.  Work with your health care provider to learn what changes you can make to improve your health and wellness and to prevent falls. This information is not intended to replace advice given to you by your health care provider. Make sure you discuss any questions you have with your health care provider. Document Released: 10/21/2017 Document Revised: 10/21/2017 Document Reviewed: 10/21/2017 Elsevier Interactive Patient Education  2019 [employer].

## 2019-03-08 ENCOUNTER — Other Ambulatory Visit: Payer: Self-pay | Admitting: Family Medicine

## 2019-03-08 DIAGNOSIS — F411 Generalized anxiety disorder: Secondary | ICD-10-CM

## 2019-03-08 NOTE — Telephone Encounter (Signed)
Visit is UTD  Reviewed [place]

## 2019-03-30 DIAGNOSIS — R972 Elevated prostate specific antigen [PSA]: Secondary | ICD-10-CM | POA: Diagnosis not present

## 2019-04-26 ENCOUNTER — Encounter: Payer: Self-pay | Admitting: Family Medicine

## 2019-05-26 ENCOUNTER — Encounter: Payer: Self-pay | Admitting: Family Medicine

## 2019-05-27 ENCOUNTER — Other Ambulatory Visit: Payer: Medicare Other

## 2019-05-27 ENCOUNTER — Telehealth: Payer: Self-pay

## 2019-05-27 ENCOUNTER — Telehealth: Payer: Self-pay | Admitting: *Deleted

## 2019-05-27 DIAGNOSIS — R6889 Other general symptoms and signs: Secondary | ICD-10-CM | POA: Diagnosis not present

## 2019-05-27 DIAGNOSIS — Z20822 Contact with and (suspected) exposure to covid-19: Secondary | ICD-10-CM

## 2019-05-27 NOTE — Telephone Encounter (Signed)
Pt called and scheduled for appt at [facility] site on 05/27/19 at 3:15 pm.

## 2019-05-27 NOTE — Telephone Encounter (Signed)
Patient spoke with pcp today. Per pcp patient is needing covid-19 testing due to symptoms.

## 2019-05-27 NOTE — Telephone Encounter (Signed)
Testing requested by pt's PCP, Dr. Janett Billow Copland.Patient called and scheduled for COVID-19 testing at [facility] site on 05/27/19 at 3:15 pm. Pt given address and advised to wear a mask and to remain in car for appt. Understanding verbalized.

## 2019-05-30 LAB — NOVEL CORONAVIRUS, NAA: SARS-CoV-2, NAA: NOT DETECTED

## 2019-07-25 NOTE — Progress Notes (Addendum)
[place] at [facility] [address] [place], [place] [address] [phone]  Date:  07/27/2019   Name:  John Wagner.   DOB:  11/02/1951   MRN:  [id number]  PCP:  Darreld Mclean, MD    Chief Complaint: Hyperlipidemia (6 month follow up), Hypertension, and Lab Work (psa check)   History of Present Illness:  John Diebel. is a 68 y.o. very pleasant male patient who presents with the following:  Periodic follow-up visit today History of pre-diabetes, HTN, hyperlipidemia Last visit with myself in February He sees Dr. Karsten Ro for management of BPH and elevated PSA His work was closed for a couple of months and now open again- they have reduced hours due to pandemic still  He had a mild illness a couple of months ago and then seemed to recover.  He was tested for covid and was negative  His anxiety has been a bit worse during the pandemic He is using alprazolam prn - last filled 40 pills in late May He has been a bit down due to uncertainty about the future He is waking up frequently during sleep- he will have a bad night about once a week.  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   Per lab notes from February: Your A1c is stable, in the prediabetes range Cholesterol is overall reasonable, but your LDL is a bit high.  These numbers do give you an elevated ten-year risk of cardiovascular disease, see estimation below.  As your risk is over 10%, I would suggest we try to lower your cholesterol The 10-year ASCVD risk score Mikey Bussing DC Brooke Bonito., et al., 2013) is: 18.1%   Values used to calculate the score:     Age: 47 years     Sex: Male     Is Non-Hispanic  African American: No     Diabetic: No     Tobacco smoker: No     Systolic Blood Pressure: 242 mmHg     Is BP treated: Yes     HDL Cholesterol: 39.6 mg/dL     Total Cholesterol: 193 mg/dL  I know you have had body aches with statins in the past.  I wondered if you might like to try again, perhaps with a lower dose or alternative dosing schedule.  Let me know if this is something you would like to try. I believe you said your last PSA was over 10, so this number is an improvement.  I will fax a copy to Dr. Karsten Ro for you, but do bring a copy with you to your next appointment as well  Xanax prn Lisinopril 5 Inderal 10 BID prn levitra   He did think about using a cholesterol med- however he had so much trouble tolerating statins in the past he really does not want to try this again  He had zostavax. He plans to get shingrix at some point when he can  Patient Active Problem List   Diagnosis Date Noted  . Pre-diabetes 01/24/2016  . BPH (benign prostatic hyperplasia) 12/25/2014  . Erectile dysfunction 03/10/2014  . Anxiety 12/06/2012  . Psoriasis 12/06/2012  .  Hypertension   . Hyperlipidemia     Past Medical History:  Diagnosis Date  . Anxiety   . Hyperlipidemia   . Hypertension   . Psoriasis     Past Surgical History:  Procedure Laterality Date  . APPENDECTOMY      Social History   Tobacco Use  . Smoking status: Former Research scientist (life sciences)  . Smokeless tobacco: Never Used  Substance Use Topics  . Alcohol use: Yes  . Drug use: No    Family History  Problem Relation Age of Onset  . Cancer Father        prostate cancer  . Hypertension Mother   . Heart disease Mother     Allergies  Allergen Reactions  . Buspar [Buspirone]     CNS  . Statins     myalgias    Medication list has been reviewed and updated.  Current Outpatient Medications on File Prior to Visit  Medication Sig Dispense Refill  . acyclovir (ZOVIRAX) 400 MG tablet TAKE 1 TABLET BY MOUTH THREE TIMES DAILY AS  NEEDED FOR OUTBREAKS 45 tablet 3  . ALPRAZolam (XANAX) 1 MG tablet SEE NOTES 40 tablet 2  . betamethasone valerate lotion (VALISONE) 0.1 % Apply 1 application topically 2 (two) times daily. Reported on 01/23/2016    . hydrOXYzine (ATARAX/VISTARIL) 25 MG tablet Take 1 tablet (25 mg total) by mouth every 8 (eight) hours as needed for itching. 21 tablet 0  . lisinopril (PRINIVIL,ZESTRIL) 5 MG tablet Take 1 tablet (5 mg total) by mouth daily. 90 tablet 3  . propranolol (INDERAL) 10 MG tablet Use twice a day as needed for performance anxiety 30 tablet 3  . traMADol (ULTRAM) 50 MG tablet Take 1 tablet (50 mg total) by mouth every 8 (eight) hours as needed. 30 tablet 2  . vardenafil (LEVITRA) 20 MG tablet Take 1 tablet (20 mg total) by mouth daily as needed for erectile dysfunction. 95 tablet 0   No current facility-administered medications on file prior to visit.     Review of Systems:  As per HPI- otherwise negative.   Physical Examination: Vitals:   07/27/19 0817  BP: 126/84  Pulse: 67  Resp: 16  Temp: 98 F (36.7 C)  SpO2: 98%   Vitals:   07/27/19 0817  Weight: 179 lb (81.2 kg)  Height: 5\' 6"  (1.676 m)   Body mass index is 28.89 kg/m. Ideal Body Weight: Weight in (lb) to have BMI = 25: 154.6  GEN: WDWN, NAD, Non-toxic, A & O x 3, looks well HEENT: Atraumatic, Normocephalic. Neck supple. No masses, No LAD.  TM wnl Ears and Nose: No external deformity. CV: RRR, No M/G/R. No JVD. No thrill. No extra heart sounds. PULM: CTA B, no wheezes, crackles, rhonchi. No retractions. No resp. distress. No accessory muscle use. ABD: S, NT, ND, +BS. No rebound. No HSM. EXTR: No c/c/e NEURO Normal gait.  PSYCH: Normally interactive. Conversant. Not depressed or anxious appearing.  Calm demeanor.    Assessment and Plan:   ICD-10-CM   1. GAD (generalized anxiety disorder)  F41.1 ALPRAZolam (XANAX) 1 MG tablet    propranolol (INDERAL) 10 MG tablet  2. Essential hypertension  I10 CBC     Comprehensive metabolic panel    lisinopril (ZESTRIL) 5 MG tablet  3. Pre-diabetes  R73.03 Hemoglobin A1c  4. Elevated PSA  R97.20 PSA  5. Chronic knee pain, unspecified laterality  M25.569 traMADol (ULTRAM) 50 MG tablet   G89.29   6. Chronic right shoulder pain  M25.511 traMADol (ULTRAM) 50 MG tablet   G89.29   7. 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  Meds ordered this encounter  Medications  . lisinopril (ZESTRIL) 5 MG tablet    Sig: Take 1 tablet (5 mg total) by mouth daily.    Dispense:  90 tablet    Refill:  3  . ALPRAZolam (XANAX) 1 MG tablet    Sig: SEE NOTES    Dispense:  40 tablet    Refill:  2    TAKE 1/2 TABLET BY MOUTH AT NOON, AND THEN TABLET 1/2 TABLET BY MOUTH AT BEDTIME. MAY TAKE WHOLE TABLET IF NEEDED ON OCCASION. MUST LAST 30 DAYS  . traMADol (ULTRAM) 50 MG tablet    Sig: Take 1 tablet (50 mg total) by mouth every 8 (eight) hours as needed.    Dispense:  30 tablet    Refill:  2  . propranolol (INDERAL) 10 MG tablet    Sig: Use twice a day as needed for performance anxiety    Dispense:  30 tablet    Refill:  3   Orders Placed This Encounter  Procedures  . Hemoglobin A1c  . PSA  . CBC  . Comprehensive metabolic panel    @SIGN @    Signed Lamar Blinks, MD  Received his labs, message to patient  Results for orders placed or performed in visit on 07/27/19  Hemoglobin A1c  Result Value Ref Range   Hgb A1c MFr Bld 6.1 4.6 - 6.5 %  PSA  Result Value Ref Range   PSA 9.27 (H) 0.10 - 4.00 ng/mL  CBC  Result Value Ref Range   WBC  4.8 4.0 - 10.5 K/uL   RBC 5.01 4.22 - 5.81 Mil/uL   Platelets 374.0 150.0 - 400.0 K/uL   Hemoglobin 14.9 13.0 - 17.0 g/dL   HCT 43.7 39.0 - 52.0 %   MCV 87.2 78.0 - 100.0 fl   MCHC 34.0 30.0 - 36.0 g/dL   RDW 13.8 11.5 - 15.5 %  Comprehensive metabolic panel  Result Value Ref Range   Sodium 139 135 - 145 mEq/L   Potassium 3.9 3.5 - 5.1 mEq/L   Chloride 104 96 - 112 mEq/L   CO2 29 19 - 32 mEq/L   Glucose, Bld 112 (H) 70 - 99 mg/dL   BUN 12 6 - 23 mg/dL   Creatinine, Ser 0.98 0.40 - 1.50 mg/dL   Total Bilirubin 0.6 0.2 - 1.2 mg/dL   Alkaline Phosphatase 46 39 - 117 U/L   AST 14 0 - 37 U/L   ALT 16 0 - 53 U/L   Total Protein 6.5 6.0 - 8.3 g/dL   Albumin 4.4 3.5 - 5.2 g/dL   Calcium 9.0 8.4 - 10.5 mg/dL   GFR 76.43 >60.00 mL/min

## 2019-07-27 ENCOUNTER — Ambulatory Visit (INDEPENDENT_AMBULATORY_CARE_PROVIDER_SITE_OTHER): Payer: Medicare Other | Admitting: Family Medicine

## 2019-07-27 ENCOUNTER — Other Ambulatory Visit: Payer: Self-pay

## 2019-07-27 ENCOUNTER — Encounter: Payer: Self-pay | Admitting: Family Medicine

## 2019-07-27 VITALS — BP 126/84 | HR 67 | Temp 98.0°F | Resp 16 | Ht 66.0 in | Wt 179.0 lb

## 2019-07-27 DIAGNOSIS — R7303 Prediabetes: Secondary | ICD-10-CM | POA: Diagnosis not present

## 2019-07-27 DIAGNOSIS — F418 Other specified anxiety disorders: Secondary | ICD-10-CM | POA: Diagnosis not present

## 2019-07-27 DIAGNOSIS — G8929 Other chronic pain: Secondary | ICD-10-CM | POA: Diagnosis not present

## 2019-07-27 DIAGNOSIS — M25569 Pain in unspecified knee: Secondary | ICD-10-CM | POA: Diagnosis not present

## 2019-07-27 DIAGNOSIS — I10 Essential (primary) hypertension: Secondary | ICD-10-CM | POA: Diagnosis not present

## 2019-07-27 DIAGNOSIS — F411 Generalized anxiety disorder: Secondary | ICD-10-CM | POA: Diagnosis not present

## 2019-07-27 DIAGNOSIS — R972 Elevated prostate specific antigen [PSA]: Secondary | ICD-10-CM | POA: Diagnosis not present

## 2019-07-27 DIAGNOSIS — M25511 Pain in right shoulder: Secondary | ICD-10-CM | POA: Diagnosis not present

## 2019-07-27 LAB — CBC
HCT: 43.7 % (ref 39.0–52.0)
Hemoglobin: 14.9 g/dL (ref 13.0–17.0)
MCHC: 34 g/dL (ref 30.0–36.0)
MCV: 87.2 fl (ref 78.0–100.0)
Platelets: 374 10*3/uL (ref 150.0–400.0)
RBC: 5.01 Mil/uL (ref 4.22–5.81)
RDW: 13.8 % (ref 11.5–15.5)
WBC: 4.8 10*3/uL (ref 4.0–10.5)

## 2019-07-27 LAB — COMPREHENSIVE METABOLIC PANEL
ALT: 16 U/L (ref 0–53)
AST: 14 U/L (ref 0–37)
Albumin: 4.4 g/dL (ref 3.5–5.2)
Alkaline Phosphatase: 46 U/L (ref 39–117)
BUN: 12 mg/dL (ref 6–23)
CO2: 29 mEq/L (ref 19–32)
Calcium: 9 mg/dL (ref 8.4–10.5)
Chloride: 104 mEq/L (ref 96–112)
Creatinine, Ser: 0.98 mg/dL (ref 0.40–1.50)
GFR: 76.43 mL/min (ref >60.00)
Glucose, Bld: 112 mg/dL — ABNORMAL HIGH (ref 70–99)
Potassium: 3.9 mEq/L (ref 3.5–5.1)
Sodium: 139 mEq/L (ref 135–145)
Total Bilirubin: 0.6 mg/dL (ref 0.2–1.2)
Total Protein: 6.5 g/dL (ref 6.0–8.3)

## 2019-07-27 LAB — PSA: PSA: 9.27 ng/mL — ABNORMAL HIGH (ref 0.10–4.00)

## 2019-07-27 LAB — HEMOGLOBIN A1C: Hgb A1c MFr Bld: 6.1 % (ref 4.6–6.5)

## 2019-07-27 MED ORDER — ALPRAZOLAM 1 MG PO TABS: ORAL_TABLET | ORAL | 2 refills | Status: DC

## 2019-07-27 MED ORDER — PROPRANOLOL HCL 10 MG PO TABS: ORAL_TABLET | ORAL | 3 refills | Status: DC

## 2019-07-27 MED ORDER — LISINOPRIL 5 MG PO TABS: 5.0000 mg | ORAL_TABLET | Freq: Every day | ORAL | 3 refills | Status: DC

## 2019-07-27 MED ORDER — TRAMADOL HCL 50 MG PO TABS: 50.0000 mg | ORAL_TABLET | Freq: Three times a day (TID) | ORAL | 2 refills | Status: DC | PRN

## 2019-07-27 NOTE — Patient Instructions (Addendum)
Great to see you again today!   Please start on a fish oil/ omega 3 supplement OTC to help raise your HDL/ good cholesterol I refilled your medications for you today- let me know if any problems or concerns  Let's plan to visit in about 6 months  Take care!

## 2019-09-03 ENCOUNTER — Other Ambulatory Visit: Payer: Self-pay | Admitting: Family Medicine

## 2019-09-03 DIAGNOSIS — I10 Essential (primary) hypertension: Secondary | ICD-10-CM

## 2019-09-05 DIAGNOSIS — L814 Other melanin hyperpigmentation: Secondary | ICD-10-CM | POA: Diagnosis not present

## 2019-09-05 DIAGNOSIS — L738 Other specified follicular disorders: Secondary | ICD-10-CM | POA: Diagnosis not present

## 2019-09-05 DIAGNOSIS — L03012 Cellulitis of left finger: Secondary | ICD-10-CM | POA: Diagnosis not present

## 2019-09-05 DIAGNOSIS — L570 Actinic keratosis: Secondary | ICD-10-CM | POA: Diagnosis not present

## 2019-09-05 DIAGNOSIS — L821 Other seborrheic keratosis: Secondary | ICD-10-CM | POA: Diagnosis not present

## 2019-09-05 DIAGNOSIS — L400 Psoriasis vulgaris: Secondary | ICD-10-CM | POA: Diagnosis not present

## 2019-11-02 DIAGNOSIS — R972 Elevated prostate specific antigen [PSA]: Secondary | ICD-10-CM | POA: Diagnosis not present

## 2019-11-09 DIAGNOSIS — N400 Enlarged prostate without lower urinary tract symptoms: Secondary | ICD-10-CM | POA: Diagnosis not present

## 2019-11-09 DIAGNOSIS — R972 Elevated prostate specific antigen [PSA]: Secondary | ICD-10-CM | POA: Diagnosis not present

## 2019-12-28 DIAGNOSIS — Z03818 Encounter for observation for suspected exposure to other biological agents ruled out: Secondary | ICD-10-CM | POA: Diagnosis not present

## 2020-01-26 NOTE — Patient Instructions (Addendum)
It was great to see you again today, please see me for a physical in August or later this fall I will be in touch with your labs asap Flu shot and pneumonia booster given today  Consider the Shingrix vaccine for shingles at your convenience

## 2020-01-26 NOTE — Progress Notes (Addendum)
[place] at [facility] [address], [place], [place] [address] [phone]  Date:  01/30/2020   Name:  John Wagner.   DOB:  [birth year]   MRN:  [id number]  PCP:  Darreld Mclean, MD    Chief Complaint: Anxiety (6 month follow up) and Shoulder Pain (right shoulder pain)   History of Present Illness:  John Sopp. is a 69 y.o. very pleasant male patient who presents with the following:  Here today for periodic follow-up visit History of prediabetes, BPH, hypertension, hyperlipidemia, anxiety, joint pain Last seen by myself in August 2020 His mother had covid 19 recently but did recover  He has had some knee pain, he did injure his right shoulder scrubbing the roof of his [employer]- this was in late October.  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, also plays the bagpipes at various events He does not do a lot of formal exercise, but is active at his job  Flu vaccine- give today  Tetanus due this summer Colon cancer up-to-date Offer Pneumovax-had Prevnar 2019 Labs done in August  Alprazolam Hydroxyzine Lisinopril Propranolol- uses on occasion as needed for performance anxiety, mostly for plane of bagpipes Tramadol  12/01/2019  1   07/27/2019  Alprazolam 1 MG Tablet  40.00  30 Je Cop   [id number]   Wal [phone])   2  2.67 LME  Medicare   [place]  12/01/2019  1   07/27/2019  Tramadol Hcl 50 MG Tablet  30.00  10 Je Cop   [id number]   Wal [phone])   2  15.00 MME  Medicare   [place]  09/14/2019  1   07/27/2019  Alprazolam 1 MG Tablet  40.00  30 Je Cop   [id number]   Wal [phone])   1  2.67 LME  Comm Ins   [place]  09/14/2019  1   07/27/2019  Tramadol Hcl 50 MG Tablet  30.00  10 Je Cop   [id number]   Wal ([phone])   1  15.00 MME  Comm Ins   [place]  07/27/2019  1   07/27/2019  Tramadol Hcl 50 MG Tablet  30.00  10 Je Cop    [id number]   Wal ([phone])   0  15.00 MME  Comm Ins   [place]  07/27/2019  1   07/27/2019  Alprazolam 1 MG Tablet  40.00  30 Je Cop   [id number]   Wal ([phone])   0  2.67 LME  Comm Ins   [place]  05/17/2019  1   02/09/2019  Tramadol Hcl 50 MG Tablet  30.00  10 Je Cop   [id number]   Wal ([phone])   2  15.00 MME  Comm Ins   [place]  05/17/2019  1   03/08/2019  Alprazolam 1 MG Tablet  40.00  30 Je Cop   [id number]   Wal [phone])   2  2.67 LME  Comm Ins   [place]  04/14/2019  1   03/08/2019  Alprazolam 1 MG Tablet  40.00  30 Je Cop   [id number]   Wal [phone])   1  2.67 LME  Comm Ins   [place]  04/14/2019  1   02/09/2019  Tramadol Hcl 50 MG Tablet  30.00  10 Je Cop   [id number]   Wal [phone])   1  15.00 MME        Patient Active Problem List   Diagnosis Date Noted  . Pre-diabetes 01/24/2016  . BPH (benign prostatic hyperplasia) 12/25/2014  . Erectile dysfunction 03/10/2014  . Anxiety 12/06/2012  . Psoriasis 12/06/2012  . Hypertension   . Hyperlipidemia     Past Medical History:  Diagnosis Date  . Anxiety   . Hyperlipidemia   . Hypertension   . Psoriasis     Past Surgical History:  Procedure Laterality Date  . APPENDECTOMY      Social History   Tobacco Use  . Smoking status: Former Research scientist (life sciences)  . Smokeless tobacco: Never Used  Substance Use Topics  . Alcohol use: Yes  . Drug use: No    Family History  Problem Relation Age of Onset  . Cancer Father        prostate cancer  . Hypertension Mother   . Heart disease Mother     Allergies  Allergen Reactions  . Buspar [Buspirone]     CNS  . Statins     myalgias    Medication list has been reviewed and updated.  Current Outpatient Medications on File Prior to Visit  Medication Sig Dispense Refill  . acyclovir (ZOVIRAX) 400 MG tablet TAKE 1 TABLET BY MOUTH THREE TIMES DAILY AS NEEDED FOR OUTBREAKS 45 tablet 3  . ALPRAZolam (XANAX) 1 MG tablet SEE NOTES 40 tablet 2  . betamethasone valerate lotion (VALISONE) 0.1 % Apply 1 application topically 2 (two) times daily. Reported on 01/23/2016    .  hydrOXYzine (ATARAX/VISTARIL) 25 MG tablet Take 1 tablet (25 mg total) by mouth every 8 (eight) hours as needed for itching. 21 tablet 0  . lisinopril (ZESTRIL) 5 MG tablet TAKE 1 TABLET BY MOUTH EVERY DAY 90 tablet 3  . propranolol (INDERAL) 10 MG tablet Use twice a day as needed for performance anxiety 30 tablet 3  . traMADol (ULTRAM) 50 MG tablet Take 1 tablet (50 mg total) by mouth every 8 (eight) hours as needed. 30 tablet 2   No current facility-administered medications on file prior to visit.    Review of Systems:  As per HPI- otherwise negative.   Physical Examination: Vitals:   01/30/20 0819  BP: 122/86  Pulse: 89  Resp: 16  Temp: (!) 97.4 F (36.3 C)  SpO2: 95%   Vitals:   01/30/20 0819  Weight: 181 lb (82.1 kg)  Height: 5\' 6"  (1.676 m)   Body mass index is 29.21 kg/m. Ideal Body Weight: Weight in (lb) to have BMI = 25: 154.6  GEN: WDWN, NAD, Non-toxic, A & O x 3, overweight, looks well HEENT: Atraumatic, Normocephalic. Neck supple. No masses, No LAD. Ears and Nose: No external deformity. CV: RRR, No M/G/R. No JVD. No thrill. No extra heart sounds. PULM: CTA B, no wheezes, crackles, rhonchi. No retractions. No resp. distress. No accessory muscle use. ABD: S, NT, ND. No rebound. No HSM. EXTR: No c/c/e NEURO Normal gait.  PSYCH: Normally interactive. Conversant. Not depressed or anxious appearing.  Calm demeanor.   Wt Readings from Last 3 Encounters:  01/30/20 181 lb (82.1 kg)  07/27/19 179 lb (81.2 kg)  02/09/19 176 lb (79.8 kg)    Assessment and Plan: GAD (generalized anxiety disorder) - Plan: ALPRAZolam (XANAX) 1 MG tablet, propranolol (INDERAL) 10 MG tablet  Essential hypertension - Plan: CBC, Comprehensive metabolic panel  Pre-diabetes - Plan:  Hemoglobin A1c  Chronic knee pain, unspecified laterality - Plan: traMADol (ULTRAM) 50 MG tablet  Chronic right shoulder pain - Plan: traMADol (ULTRAM) 50 MG tablet  Elevated PSA - Plan:  PSA  Hyperlipidemia, unspecified hyperlipidemia type - Plan: Lipid panel  Performance anxiety - Plan: propranolol (INDERAL) 10 MG tablet  Immunization due - Plan: Pneumococcal polysaccharide vaccine 23-valent greater than or equal to 2yo subcutaneous/IM, Flu Vaccine QUAD 6+ mos PF IM (Fluarix Quad PF)  Following up today for chronic conditions Refill of his maintenance medications Tramadol use for knee pain and shoulder pain Alprazolam for anxiety Gave Pneumovax and flu vaccines today Check PSA today, he will follow up with urology Will plan further follow- up pending labs. Moderate medical decision making This visit occurred during the SARS-CoV-2 public health emergency.  Safety protocols were in place, including screening questions prior to the visit, additional usage of staff PPE, and extensive cleaning of exam room while observing appropriate contact time as indicated for disinfecting solutions.    Signed Lamar Blinks, MD   Received his labs 2/9, message to patient  Results for orders placed or performed in visit on 01/30/20  CBC  Result Value Ref Range   WBC 4.5 4.0 - 10.5 K/uL   RBC 5.18 4.22 - 5.81 Mil/uL   Platelets 346.0 150.0 - 400.0 K/uL   Hemoglobin 15.3 13.0 - 17.0 g/dL   HCT 45.1 39.0 - 52.0 %   MCV 87.0 78.0 - 100.0 fl   MCHC 33.8 30.0 - 36.0 g/dL   RDW 13.8 11.5 - 15.5 %  Comprehensive metabolic panel  Result Value Ref Range   Sodium 138 135 - 145 mEq/L   Potassium 4.7 3.5 - 5.1 mEq/L   Chloride 104 96 - 112 mEq/L   CO2 30 19 - 32 mEq/L   Glucose, Bld 108 (H) 70 - 99 mg/dL   BUN 15 6 - 23 mg/dL   Creatinine, Ser 0.98 0.40 - 1.50 mg/dL   Total Bilirubin 0.7 0.2 - 1.2 mg/dL   Alkaline Phosphatase 45 39 - 117 U/L   AST 15 0 - 37 U/L   ALT 19 0 - 53 U/L   Total Protein 6.3 6.0 - 8.3 g/dL   Albumin 4.2 3.5 - 5.2 g/dL   GFR 76.31 >60.00 mL/min   Calcium 8.9 8.4 - 10.5 mg/dL  Hemoglobin A1c  Result Value Ref Range   Hgb A1c MFr Bld 6.1 4.6 - 6.5 %   Lipid panel  Result Value Ref Range   Cholesterol 227 (H) 0 - 200 mg/dL   Triglycerides 127.0 0.0 - 149.0 mg/dL   HDL 42.60 >39.00 mg/dL   VLDL 25.4 0.0 - 40.0 mg/dL   LDL Cholesterol 159 (H) 0 - 99 mg/dL   Total CHOL/HDL Ratio 5    NonHDL 184.84   PSA  Result Value Ref Range   PSA 7.54 (H) 0.10 - 4.00 ng/mL

## 2020-01-27 ENCOUNTER — Other Ambulatory Visit: Payer: Self-pay

## 2020-01-30 ENCOUNTER — Other Ambulatory Visit: Payer: Self-pay

## 2020-01-30 ENCOUNTER — Ambulatory Visit (INDEPENDENT_AMBULATORY_CARE_PROVIDER_SITE_OTHER): Payer: Medicare Other | Admitting: Family Medicine

## 2020-01-30 ENCOUNTER — Encounter: Payer: Self-pay | Admitting: Family Medicine

## 2020-01-30 VITALS — BP 122/86 | HR 89 | Temp 97.4°F | Resp 16 | Ht 66.0 in | Wt 181.0 lb

## 2020-01-30 DIAGNOSIS — E785 Hyperlipidemia, unspecified: Secondary | ICD-10-CM

## 2020-01-30 DIAGNOSIS — R7303 Prediabetes: Secondary | ICD-10-CM

## 2020-01-30 DIAGNOSIS — I10 Essential (primary) hypertension: Secondary | ICD-10-CM | POA: Diagnosis not present

## 2020-01-30 DIAGNOSIS — F411 Generalized anxiety disorder: Secondary | ICD-10-CM | POA: Diagnosis not present

## 2020-01-30 DIAGNOSIS — M25511 Pain in right shoulder: Secondary | ICD-10-CM | POA: Diagnosis not present

## 2020-01-30 DIAGNOSIS — M25569 Pain in unspecified knee: Secondary | ICD-10-CM | POA: Diagnosis not present

## 2020-01-30 DIAGNOSIS — R972 Elevated prostate specific antigen [PSA]: Secondary | ICD-10-CM | POA: Diagnosis not present

## 2020-01-30 DIAGNOSIS — G8929 Other chronic pain: Secondary | ICD-10-CM | POA: Diagnosis not present

## 2020-01-30 DIAGNOSIS — F418 Other specified anxiety disorders: Secondary | ICD-10-CM

## 2020-01-30 DIAGNOSIS — Z23 Encounter for immunization: Secondary | ICD-10-CM | POA: Diagnosis not present

## 2020-01-30 LAB — COMPREHENSIVE METABOLIC PANEL
ALT: 19 U/L (ref 0–53)
AST: 15 U/L (ref 0–37)
Albumin: 4.2 g/dL (ref 3.5–5.2)
Alkaline Phosphatase: 45 U/L (ref 39–117)
BUN: 15 mg/dL (ref 6–23)
CO2: 30 mEq/L (ref 19–32)
Calcium: 8.9 mg/dL (ref 8.4–10.5)
Chloride: 104 mEq/L (ref 96–112)
Creatinine, Ser: 0.98 mg/dL (ref 0.40–1.50)
GFR: 76.31 mL/min (ref >60.00)
Glucose, Bld: 108 mg/dL — ABNORMAL HIGH (ref 70–99)
Potassium: 4.7 mEq/L (ref 3.5–5.1)
Sodium: 138 mEq/L (ref 135–145)
Total Bilirubin: 0.7 mg/dL (ref 0.2–1.2)
Total Protein: 6.3 g/dL (ref 6.0–8.3)

## 2020-01-30 LAB — HEMOGLOBIN A1C: Hgb A1c MFr Bld: 6.1 % (ref 4.6–6.5)

## 2020-01-30 LAB — LIPID PANEL
Cholesterol: 227 mg/dL — ABNORMAL HIGH (ref 0–200)
HDL: 42.6 mg/dL (ref >39.00)
LDL Cholesterol: 159 mg/dL — ABNORMAL HIGH (ref 0–99)
NonHDL: 184.84
Total CHOL/HDL Ratio: 5
Triglycerides: 127 mg/dL (ref 0.0–149.0)
VLDL: 25.4 mg/dL (ref 0.0–40.0)

## 2020-01-30 LAB — CBC
HCT: 45.1 % (ref 39.0–52.0)
Hemoglobin: 15.3 g/dL (ref 13.0–17.0)
MCHC: 33.8 g/dL (ref 30.0–36.0)
MCV: 87 fl (ref 78.0–100.0)
Platelets: 346 10*3/uL (ref 150.0–400.0)
RBC: 5.18 Mil/uL (ref 4.22–5.81)
RDW: 13.8 % (ref 11.5–15.5)
WBC: 4.5 10*3/uL (ref 4.0–10.5)

## 2020-01-30 LAB — PSA: PSA: 7.54 ng/mL — ABNORMAL HIGH (ref 0.10–4.00)

## 2020-01-30 MED ORDER — PROPRANOLOL HCL 10 MG PO TABS: ORAL_TABLET | ORAL | 3 refills | Status: DC

## 2020-01-30 MED ORDER — ALPRAZOLAM 1 MG PO TABS: ORAL_TABLET | ORAL | 2 refills | Status: DC

## 2020-01-30 MED ORDER — TRAMADOL HCL 50 MG PO TABS: 50.0000 mg | ORAL_TABLET | Freq: Three times a day (TID) | ORAL | 2 refills | Status: DC | PRN

## 2020-01-31 ENCOUNTER — Encounter: Payer: Self-pay | Admitting: Family Medicine

## 2020-02-17 ENCOUNTER — Ambulatory Visit: Payer: Medicare Other | Attending: Internal Medicine

## 2020-02-17 DIAGNOSIS — Z23 Encounter for immunization: Secondary | ICD-10-CM

## 2020-02-17 NOTE — Progress Notes (Signed)
   [id number] [facility]  Name:  John Wagner.    MRN: [id number] DOB: [birth year]  02/17/2020  [name] was observed post Covid-19 immunization for 15 minutes without incidence. He was provided with Vaccine Information Sheet and instruction to access the V-Safe system.   John Wagner was instructed to call 911 with any severe reactions post vaccine: [name] Difficulty breathing  . Swelling of your face and throat  . A fast heartbeat  . A bad rash all over your body  . Dizziness and weakness    Immunizations Administered    Name Date Dose VIS Date Route   Pfizer COVID-19 Vaccine 02/17/2020  9:05 AM 0.3 mL 12/02/2019 Intramuscular   Manufacturer: [place]   Lot: [id number]   [place]: [id number]

## 2020-02-28 ENCOUNTER — Encounter: Payer: Self-pay | Admitting: Family Medicine

## 2020-02-29 NOTE — Telephone Encounter (Signed)
Would you like to see Patient first or patient to see Ortho?

## 2020-03-05 DIAGNOSIS — M5416 Radiculopathy, lumbar region: Secondary | ICD-10-CM | POA: Diagnosis not present

## 2020-03-05 DIAGNOSIS — S83282A Other tear of lateral meniscus, current injury, left knee, initial encounter: Secondary | ICD-10-CM | POA: Diagnosis not present

## 2020-03-05 DIAGNOSIS — M1712 Unilateral primary osteoarthritis, left knee: Secondary | ICD-10-CM | POA: Diagnosis not present

## 2020-03-13 ENCOUNTER — Ambulatory Visit: Payer: Medicare Other | Attending: Internal Medicine

## 2020-03-13 DIAGNOSIS — Z23 Encounter for immunization: Secondary | ICD-10-CM

## 2020-03-13 NOTE — Progress Notes (Signed)
   [id number] Vaccination Clinic  Name:  [name].    MRN: [id number] DOB: 1951-05-30  03/13/2020  John Wagner was observed post Covid-19 immunization for 15 minutes without incident. He was provided with Vaccine Information Sheet and instruction to access the V-Safe system.   Mr. Joachim was instructed to call 911 with any severe reactions post vaccine: [name] Difficulty breathing  . Swelling of face and throat  . A fast heartbeat  . A bad rash all over body  . Dizziness and weakness   Immunizations Administered    Name Date Dose VIS Date Route   Pfizer COVID-19 Vaccine 03/13/2020 10:50 AM 0.3 mL 12/02/2019 Intramuscular   Manufacturer: [place]   Lot: [id number]   [place]: [id number]

## 2020-03-16 DIAGNOSIS — M25562 Pain in left knee: Secondary | ICD-10-CM | POA: Diagnosis not present

## 2020-05-01 DIAGNOSIS — R972 Elevated prostate specific antigen [PSA]: Secondary | ICD-10-CM | POA: Diagnosis not present

## 2020-05-08 DIAGNOSIS — N400 Enlarged prostate without lower urinary tract symptoms: Secondary | ICD-10-CM | POA: Diagnosis not present

## 2020-05-08 DIAGNOSIS — R972 Elevated prostate specific antigen [PSA]: Secondary | ICD-10-CM | POA: Diagnosis not present

## 2020-07-05 ENCOUNTER — Encounter: Payer: Self-pay | Admitting: Family Medicine

## 2020-07-05 ENCOUNTER — Other Ambulatory Visit: Payer: Self-pay | Admitting: *Deleted

## 2020-07-05 DIAGNOSIS — F411 Generalized anxiety disorder: Secondary | ICD-10-CM

## 2020-07-05 MED ORDER — ALPRAZOLAM 1 MG PO TABS: ORAL_TABLET | ORAL | 2 refills | Status: DC

## 2020-07-05 MED ORDER — ALPRAZOLAM 1 MG PO TABS: ORAL_TABLET | ORAL | 1 refills | Status: DC

## 2020-07-05 NOTE — Telephone Encounter (Signed)
Received fax from [facility] on [place]. Last Alprazolam RX: 01/30/20, #40 x 2 refills Last OV:  01/30/20 Next OV: 07/30/20 UDS:  Not on file CSC:  Not on file

## 2020-07-30 ENCOUNTER — Ambulatory Visit: Payer: Medicare Other | Admitting: Family Medicine

## 2020-08-01 ENCOUNTER — Telehealth: Payer: Self-pay | Admitting: Family Medicine

## 2020-08-01 NOTE — Telephone Encounter (Signed)
Could you reach out to patient?  He needs an appointment to be tested and place lab orders. He has an appt on 8/16 but I don't know if he is ok waiting until then.

## 2020-08-01 NOTE — Telephone Encounter (Signed)
Caller Coy [place] Call Back # [phone]   Patient believed that  he exposure to a possible STD, patient states  intercourse with partner last Sunday , patient states now right sided groin pain.   Patient is requesting Dr. Lorelei Pont to put in orders in epic for blood testing for  PSA and STD.   Please Advise

## 2020-08-03 ENCOUNTER — Other Ambulatory Visit (HOSPITAL_COMMUNITY)
Admission: RE | Admit: 2020-08-03 | Discharge: 2020-08-03 | Disposition: A | Discharge status: Home / Self Care | Payer: Medicare Other | Source: Ambulatory Visit | Attending: Family Medicine | Admitting: Family Medicine

## 2020-08-03 ENCOUNTER — Ambulatory Visit (INDEPENDENT_AMBULATORY_CARE_PROVIDER_SITE_OTHER): Payer: Medicare Other | Admitting: Family Medicine

## 2020-08-03 ENCOUNTER — Other Ambulatory Visit: Payer: Self-pay

## 2020-08-03 ENCOUNTER — Encounter: Payer: Self-pay | Admitting: Family Medicine

## 2020-08-03 VITALS — BP 122/80 | HR 77 | Temp 98.4°F | Ht 66.5 in | Wt 179.1 lb

## 2020-08-03 DIAGNOSIS — N451 Epididymitis: Secondary | ICD-10-CM | POA: Insufficient documentation

## 2020-08-03 DIAGNOSIS — R1031 Right lower quadrant pain: Secondary | ICD-10-CM | POA: Insufficient documentation

## 2020-08-03 DIAGNOSIS — R972 Elevated prostate specific antigen [PSA]: Secondary | ICD-10-CM | POA: Diagnosis not present

## 2020-08-03 LAB — PSA: PSA: 7.62 ng/mL — ABNORMAL HIGH (ref 0.10–4.00)

## 2020-08-03 MED ORDER — SULFAMETHOXAZOLE-TRIMETHOPRIM 800-160 MG PO TABS: 1.0000 | ORAL_TABLET | Freq: Two times a day (BID) | ORAL | 0 refills | Status: AC

## 2020-08-03 NOTE — Patient Instructions (Signed)
Continue with the ibuprofen.  OK to take Tylenol 1000 mg (2 extra strength tabs) or 975 mg (3 regular strength tabs) every 6 hours as needed.  Ice/cold pack over area for 10-15 min twice daily.  Someone will reach out regarding your ultrasound.   Let us know if you need anything.

## 2020-08-03 NOTE — Progress Notes (Signed)
Chief Complaint  Patient presents with  . Follow-up    problem with prostate and labs today    Subjective: Patient is a 69 y.o. male here for R sided genital pain.  5 d of R sided groin pain. No inj or change in activity. New sex partner, no concerns on her end. No urinary complaints or d/c. No testicular pain. Ibuprofen was helpful. Denies fevers, bowel changes, skin changes, abd pain. He does have a hx of elevated PSA and follows with the urology team. He is worried about prostatitis. He specifically denies any pain or pressure in the rectal or peroneum.   Past Medical History:  Diagnosis Date  . Anxiety   . Elevated PSA    Seeing Dr. Para March  . Hyperlipidemia   . Hypertension   . Psoriasis    Objective: BP 122/80 (BP Location: Right Arm, Patient Position: Sitting, Cuff Size: Normal)   Pulse 77   Temp 98.4 F (36.9 C) (Oral)   Ht 5' 6.5" (1.689 m)   Wt 179 lb 2 oz (81.3 kg)   SpO2 98%   BMI 28.48 kg/m  General: Awake, appears stated age HEENT: MMM, EOMi Heart: RRR Abd: BS+, S, NT, ND GU: Testes present b/l, ttp over R epididymis, I did not appreciate a hernia but there was tenderness with digital insertion into canal on R Lungs: CTAB, no rales, wheezes or rhonchi. No accessory muscle use  Psych: Age appropriate judgment and insight, normal affect and mood  Assessment and Plan: Right inguinal pain - Plan: Urine cytology ancillary only([place]), US Abdomen Limited  Elevated PSA - Plan: PSA  Epididymitis - Plan: Urine cytology ancillary only([place])  Ck for STI's. Ck PSA. He was ttp over epididymis, will tx for this. Ck [place].  F/u as originally scheduled.  The patient voiced understanding and agreement to the plan.  [place], DO 08/03/20  2:32 PM

## 2020-08-04 NOTE — Progress Notes (Addendum)
[place] at [facility] [address] [place], [place] [address] [phone]    Date:  08/06/2020   Name:  John Wagner.   DOB:  1951/11/16   MRN:  [id number]  PCP:  Darreld Mclean, MD    Chief Complaint: Anxiety (6 month follow up)   History of Present Illness:  John Wagner. is a 69 y.o. very pleasant male patient who presents with the following:  6 month follow-up visit today Last seen by myself in February  History of prediabetes, BPH, hypertension, hyperlipidemia, anxiety treated with xanax, joint pain  covid vaccine complete  Most recent labs done in February of this year  Tetanus vaccine, shingrix at pharmacy  Colon UTD- due this winter.  Will order colguard   He was seen by my partner Dr Nani Ravens last week with concern of epidymidis, treated with bactrim - this is helping, he is feeling better [name] recently entered into a new relationship with a woman he has known for some time.  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  He is actually on his way to play a funeral after our visit, is already wearing his [place]    08/02/2020  1   07/05/2020  Alprazolam 1 MG Tablet  40.00  30 Je Cop   [id number]   Har (1595)   1/2  2.67 LME  Medicare   [place]  07/05/2020  1   07/05/2020  Alprazolam 1 MG Tablet  40.00  30 Je Cop   [id number]   Har (1595)   0/2  2.67 LME  Medicare   [place]  07/03/2020  1   01/30/2020  Tramadol Hcl 50 MG Tablet  30.00  10 Je Cop   [id number]   Har (1595)   2/2  15.00 MME  Comm Ins   [name]  05/11/2020  1   01/30/2020  Alprazolam 1 MG Tablet  40.00  30 Je Cop   [id number]   Har (1595)   2/2  2.67 LME  Medicare   [place]  03/05/2020  1   01/30/2020   Alprazolam 1 MG Tablet  40.00  30 Je Cop   [id number]   Har (1595)   1/2  2.67 LME  Medicare   [place]  03/05/2020  1   01/30/2020  Tramadol Hcl 50 MG Tablet  30.00  10 Je Cop   [id number]   Har (1595)   1/2  15.00 MME  Medicare   [place]  01/30/2020  1   01/30/2020  Tramadol Hcl 50 MG Tablet  30.00  10 Je Cop   [id number]   Har (1595)   0/2  15.00 MME  Medicare   [place]  01/30/2020  1   01/30/2020  Alprazolam 1 MG Tablet  40.00  30 Je Cop   [id number]   Har (1595)   0/2  2.67 LME  Medicare   [place]  12/01/2019  1   07/27/2019  Tramadol Hcl 50 MG Tablet  30.00  10 Je Cop   [id number]   Wal [phone])   2/2  15.00 MME  Patient Active Problem List   Diagnosis Date Noted  . Elevated PSA   . Pre-diabetes 01/24/2016  . BPH (benign prostatic hyperplasia) 12/25/2014  . Erectile dysfunction 03/10/2014  . Anxiety 12/06/2012  . Psoriasis 12/06/2012  . Hypertension   . Hyperlipidemia     Past Medical History:  Diagnosis Date  . Anxiety   . Elevated PSA    Seeing Dr. Para March  . Hyperlipidemia   . Hypertension   . Psoriasis     Past Surgical History:  Procedure Laterality Date  . APPENDECTOMY      Social History   Tobacco Use  . Smoking status: Former Research scientist (life sciences)  . Smokeless tobacco: Never Used  Substance Use Topics  . Alcohol use: Yes  . Drug use: No    Family History  Problem Relation Age of Onset  . Cancer Father        prostate cancer  . Hypertension Mother   . Heart disease Mother     Allergies  Allergen Reactions  . Buspar [Buspirone]     CNS  . Statins     myalgias    Medication list has been reviewed and updated.  Current Outpatient Medications on File Prior to Visit  Medication Sig Dispense Refill  . acyclovir (ZOVIRAX) 400 MG tablet TAKE 1 TABLET BY MOUTH THREE TIMES DAILY AS NEEDED FOR OUTBREAKS 45 tablet 3  . betamethasone valerate lotion (VALISONE) 0.1 % Apply 1 application topically 2 (two) times daily. Reported on 01/23/2016    . hydrOXYzine (ATARAX/VISTARIL) 25 MG tablet Take 1 tablet  (25 mg total) by mouth every 8 (eight) hours as needed for itching. 21 tablet 0  . lisinopril (ZESTRIL) 5 MG tablet TAKE 1 TABLET BY MOUTH EVERY DAY 90 tablet 3  . propranolol (INDERAL) 10 MG tablet Use twice a day as needed for performance anxiety 30 tablet 3  . sulfamethoxazole-trimethoprim (BACTRIM DS) 800-160 MG tablet Take 1 tablet by mouth 2 (two) times daily for 10 days. 20 tablet 0   No current facility-administered medications on file prior to visit.    Review of Systems:  As per HPI- otherwise negative.  Physical Examination: Vitals:   08/06/20 0942  BP: 122/80  Pulse: 75  Resp: 16  SpO2: 98%   Vitals:   08/06/20 0942  Weight: 183 lb (83 kg)  Height: 5\' 6"  (1.676 m)   Body mass index is 29.54 kg/m. Ideal Body Weight: Weight in (lb) to have BMI = 25: 154.6  GEN: no acute distress.  Mild overweight, looks well HEENT: Atraumatic, Normocephalic.   Bilateral TM wnl, oropharynx normal.  PEERL,EOMI.   Ears and Nose: No external deformity. CV: RRR, No M/G/R. No JVD. No thrill. No extra heart sounds. PULM: CTA B, no wheezes, crackles, rhonchi. No retractions. No resp. distress. No accessory muscle use. ABD: S, NT, ND, +BS. No rebound. No HSM. EXTR: No c/c/e PSYCH: Normally interactive. Conversant.    Assessment and Plan: GAD (generalized anxiety disorder) - Plan: ALPRAZolam (XANAX) 1 MG tablet  Essential hypertension - Plan: CBC, Comprehensive metabolic panel  Pre-diabetes - Plan: Hemoglobin A1c  Chronic knee pain, unspecified laterality - Plan: traMADol (ULTRAM) 50 MG tablet  Chronic right shoulder pain - Plan: traMADol (ULTRAM) 50 MG tablet  Hyperlipidemia, unspecified hyperlipidemia type - Plan: Lipid panel  Colon cancer screening  Here today for follow-up visit Refill tramadol for knee pain, alprazolam for anxiety Blood pressure under good control Labs are pending as above Ordered Cologuard for patient Will plan further  follow- up pending labs.  This  visit occurred during the SARS-CoV-2 public health emergency.  Safety protocols were in place, including screening questions prior to the visit, additional usage of staff PPE, and extensive cleaning of exam room while observing appropriate contact time as indicated for disinfecting solutions.    Signed Lamar Blinks, MD  Received his labs as below, message to patient   Results for orders placed or performed in visit on 08/06/20  CBC  Result Value Ref Range   WBC 5.8 4.0 - 10.5 K/uL   RBC 4.91 4.22 - 5.81 Mil/uL   Platelets 343.0 150 - 400 K/uL   Hemoglobin 14.4 13.0 - 17.0 g/dL   HCT 43.1 39 - 52 %   MCV 87.9 78.0 - 100.0 fl   MCHC 33.3 30.0 - 36.0 g/dL   RDW 13.8 11.5 - 15.5 %  Comprehensive metabolic panel  Result Value Ref Range   Sodium 139 135 - 145 mEq/L   Potassium 4.3 3.5 - 5.1 mEq/L   Chloride 105 96 - 112 mEq/L   CO2 26 19 - 32 mEq/L   Glucose, Bld 94 70 - 99 mg/dL   BUN 20 6 - 23 mg/dL   Creatinine, Ser 1.19 0.40 - 1.50 mg/dL   Total Bilirubin 0.5 0.2 - 1.2 mg/dL   Alkaline Phosphatase 45 39 - 117 U/L   AST 19 0 - 37 U/L   ALT 22 0 - 53 U/L   Total Protein 6.3 6.0 - 8.3 g/dL   Albumin 4.3 3.5 - 5.2 g/dL   GFR 60.89 >60.00 mL/min   Calcium 9.4 8.4 - 10.5 mg/dL  Hemoglobin A1c  Result Value Ref Range   Hgb A1c MFr Bld 6.0 4.6 - 6.5 %  Lipid panel  Result Value Ref Range   Cholesterol 197 0 - 200 mg/dL   Triglycerides 109.0 0 - 149 mg/dL   HDL 44.40 >39.00 mg/dL   VLDL 21.8 0.0 - 40.0 mg/dL   LDL Cholesterol 131 (H) 0 - 99 mg/dL   Total CHOL/HDL Ratio 4    NonHDL 153.00     The 10-year ASCVD risk score Mikey Bussing DC Jr., et al., 2013) is: 16.6%   Values used to calculate the score:     Age: 42 years     Sex: Male     Is Non-Hispanic African American: No     Diabetic: No     Tobacco smoker: No     Systolic Blood Pressure: 768 mmHg     Is BP treated: Yes     HDL Cholesterol: 44.4 mg/dL     Total Cholesterol: 197 mg/dL

## 2020-08-04 NOTE — Patient Instructions (Addendum)
Good to see you again today- please see me in about 6 months  Consider getting shingrix vaccine, tetanus booster at your pharmacy at your convenience   I will be in touch with your labs and will fax PSA to Dr Milford Cage for you

## 2020-08-06 ENCOUNTER — Ambulatory Visit (INDEPENDENT_AMBULATORY_CARE_PROVIDER_SITE_OTHER): Payer: Medicare Other | Admitting: Family Medicine

## 2020-08-06 ENCOUNTER — Encounter: Payer: Self-pay | Admitting: Family Medicine

## 2020-08-06 ENCOUNTER — Other Ambulatory Visit: Payer: Self-pay

## 2020-08-06 ENCOUNTER — Ambulatory Visit: Payer: Medicare Other | Admitting: Family Medicine

## 2020-08-06 VITALS — BP 122/80 | HR 75 | Resp 16 | Ht 66.0 in | Wt 183.0 lb

## 2020-08-06 DIAGNOSIS — E785 Hyperlipidemia, unspecified: Secondary | ICD-10-CM

## 2020-08-06 DIAGNOSIS — G8929 Other chronic pain: Secondary | ICD-10-CM | POA: Diagnosis not present

## 2020-08-06 DIAGNOSIS — N451 Epididymitis: Secondary | ICD-10-CM | POA: Diagnosis not present

## 2020-08-06 DIAGNOSIS — F411 Generalized anxiety disorder: Secondary | ICD-10-CM

## 2020-08-06 DIAGNOSIS — R7303 Prediabetes: Secondary | ICD-10-CM

## 2020-08-06 DIAGNOSIS — N50811 Right testicular pain: Secondary | ICD-10-CM | POA: Diagnosis not present

## 2020-08-06 DIAGNOSIS — M25569 Pain in unspecified knee: Secondary | ICD-10-CM | POA: Diagnosis not present

## 2020-08-06 DIAGNOSIS — M25511 Pain in right shoulder: Secondary | ICD-10-CM | POA: Diagnosis not present

## 2020-08-06 DIAGNOSIS — I10 Essential (primary) hypertension: Secondary | ICD-10-CM

## 2020-08-06 DIAGNOSIS — N400 Enlarged prostate without lower urinary tract symptoms: Secondary | ICD-10-CM | POA: Diagnosis not present

## 2020-08-06 DIAGNOSIS — R972 Elevated prostate specific antigen [PSA]: Secondary | ICD-10-CM | POA: Diagnosis not present

## 2020-08-06 DIAGNOSIS — Z1211 Encounter for screening for malignant neoplasm of colon: Secondary | ICD-10-CM | POA: Diagnosis not present

## 2020-08-06 LAB — COMPREHENSIVE METABOLIC PANEL
ALT: 22 U/L (ref 0–53)
AST: 19 U/L (ref 0–37)
Albumin: 4.3 g/dL (ref 3.5–5.2)
Alkaline Phosphatase: 45 U/L (ref 39–117)
BUN: 20 mg/dL (ref 6–23)
CO2: 26 mEq/L (ref 19–32)
Calcium: 9.4 mg/dL (ref 8.4–10.5)
Chloride: 105 mEq/L (ref 96–112)
Creatinine, Ser: 1.19 mg/dL (ref 0.40–1.50)
GFR: 60.89 mL/min (ref >60.00)
Glucose, Bld: 94 mg/dL (ref 70–99)
Potassium: 4.3 mEq/L (ref 3.5–5.1)
Sodium: 139 mEq/L (ref 135–145)
Total Bilirubin: 0.5 mg/dL (ref 0.2–1.2)
Total Protein: 6.3 g/dL (ref 6.0–8.3)

## 2020-08-06 LAB — CBC
HCT: 43.1 % (ref 39.0–52.0)
Hemoglobin: 14.4 g/dL (ref 13.0–17.0)
MCHC: 33.3 g/dL (ref 30.0–36.0)
MCV: 87.9 fl (ref 78.0–100.0)
Platelets: 343 10*3/uL (ref 150.0–400.0)
RBC: 4.91 Mil/uL (ref 4.22–5.81)
RDW: 13.8 % (ref 11.5–15.5)
WBC: 5.8 10*3/uL (ref 4.0–10.5)

## 2020-08-06 LAB — LIPID PANEL
Cholesterol: 197 mg/dL (ref 0–200)
HDL: 44.4 mg/dL (ref >39.00)
LDL Cholesterol: 131 mg/dL — ABNORMAL HIGH (ref 0–99)
NonHDL: 153
Total CHOL/HDL Ratio: 4
Triglycerides: 109 mg/dL (ref 0.0–149.0)
VLDL: 21.8 mg/dL (ref 0.0–40.0)

## 2020-08-06 LAB — URINE CYTOLOGY ANCILLARY ONLY
Chlamydia: NEGATIVE
Comment: NEGATIVE
Comment: NEGATIVE
Comment: NORMAL
Neisseria Gonorrhea: NEGATIVE
Trichomonas: NEGATIVE

## 2020-08-06 LAB — HEMOGLOBIN A1C: Hgb A1c MFr Bld: 6 % (ref 4.6–6.5)

## 2020-08-06 MED ORDER — ALPRAZOLAM 1 MG PO TABS: ORAL_TABLET | ORAL | 3 refills | Status: DC

## 2020-08-06 MED ORDER — TRAMADOL HCL 50 MG PO TABS: 50.0000 mg | ORAL_TABLET | Freq: Three times a day (TID) | ORAL | 2 refills | Status: DC | PRN

## 2020-08-06 NOTE — Progress Notes (Signed)
Cologuard ordered for patient.  

## 2020-08-07 ENCOUNTER — Encounter: Payer: Self-pay | Admitting: Family Medicine

## 2020-08-24 DIAGNOSIS — Z1211 Encounter for screening for malignant neoplasm of colon: Secondary | ICD-10-CM | POA: Diagnosis not present

## 2020-08-24 LAB — COLOGUARD: Cologuard: NEGATIVE

## 2020-08-29 ENCOUNTER — Encounter: Payer: Self-pay | Admitting: Family Medicine

## 2020-08-29 DIAGNOSIS — N529 Male erectile dysfunction, unspecified: Secondary | ICD-10-CM

## 2020-08-29 MED ORDER — TADALAFIL 20 MG PO TABS: 20.0000 mg | ORAL_TABLET | Freq: Every day | ORAL | 2 refills | Status: DC | PRN

## 2020-08-30 ENCOUNTER — Encounter: Payer: Self-pay | Admitting: Family Medicine

## 2020-08-31 ENCOUNTER — Encounter: Payer: Self-pay | Admitting: Family Medicine

## 2020-09-05 DIAGNOSIS — L820 Inflamed seborrheic keratosis: Secondary | ICD-10-CM | POA: Diagnosis not present

## 2020-09-05 DIAGNOSIS — D225 Melanocytic nevi of trunk: Secondary | ICD-10-CM | POA: Diagnosis not present

## 2020-09-05 DIAGNOSIS — L400 Psoriasis vulgaris: Secondary | ICD-10-CM | POA: Diagnosis not present

## 2020-09-05 DIAGNOSIS — L814 Other melanin hyperpigmentation: Secondary | ICD-10-CM | POA: Diagnosis not present

## 2020-09-05 DIAGNOSIS — L821 Other seborrheic keratosis: Secondary | ICD-10-CM | POA: Diagnosis not present

## 2020-09-05 DIAGNOSIS — L570 Actinic keratosis: Secondary | ICD-10-CM | POA: Diagnosis not present

## 2020-09-11 ENCOUNTER — Other Ambulatory Visit: Payer: Medicare Other

## 2020-09-11 ENCOUNTER — Other Ambulatory Visit: Payer: Self-pay

## 2020-09-11 DIAGNOSIS — Z20822 Contact with and (suspected) exposure to covid-19: Secondary | ICD-10-CM | POA: Diagnosis not present

## 2020-09-13 LAB — NOVEL CORONAVIRUS, NAA: SARS-CoV-2, NAA: NOT DETECTED

## 2020-09-13 LAB — SARS-COV-2, NAA 2 DAY TAT

## 2020-09-17 ENCOUNTER — Telehealth: Payer: Self-pay | Admitting: Family Medicine

## 2020-09-17 NOTE — Telephone Encounter (Signed)
Patient is calling to state that he has received his COVID test results. And that he no longer wishes to receive calls regarding COVID test results.

## 2020-10-01 ENCOUNTER — Encounter: Payer: Self-pay | Admitting: Family Medicine

## 2020-10-01 DIAGNOSIS — B009 Herpesviral infection, unspecified: Secondary | ICD-10-CM

## 2020-10-01 MED ORDER — ACYCLOVIR 400 MG PO TABS: ORAL_TABLET | ORAL | 3 refills | Status: DC

## 2020-11-01 ENCOUNTER — Encounter: Payer: Self-pay | Admitting: Family Medicine

## 2020-11-01 DIAGNOSIS — I10 Essential (primary) hypertension: Secondary | ICD-10-CM

## 2020-11-01 MED ORDER — LISINOPRIL 5 MG PO TABS: 5.0000 mg | ORAL_TABLET | Freq: Every day | ORAL | 3 refills | Status: DC

## 2020-11-17 ENCOUNTER — Encounter: Payer: Self-pay | Admitting: Family Medicine

## 2020-12-13 ENCOUNTER — Ambulatory Visit: Payer: Medicare Other

## 2020-12-18 ENCOUNTER — Ambulatory Visit: Payer: Medicare Other | Attending: Internal Medicine

## 2020-12-18 DIAGNOSIS — Z23 Encounter for immunization: Secondary | ICD-10-CM

## 2020-12-18 NOTE — Progress Notes (Signed)
° °  [id number]  Name:  John Wagner.    MRN: [id number] DOB: 1951-04-06  12/18/2020  [name] was observed post Covid-19 immunization for 15 minutes without incident. He was provided with Vaccine Information Sheet and instruction to access the V-Safe system.   [name] was instructed to call 911 with any severe reactions post vaccine:  Difficulty breathing   Swelling of face and throat   A fast heartbeat   A bad rash all over body   Dizziness and weakness   Immunizations Administered    Name Date Dose VIS Date Route   Pfizer COVID-19 Vaccine 12/18/2020  1:21 PM 0.3 mL 10/10/2020 Intramuscular   Manufacturer: [employer] [employer]   Lot: [id number]   [place]: [address]-6

## 2021-02-04 DIAGNOSIS — N400 Enlarged prostate without lower urinary tract symptoms: Secondary | ICD-10-CM | POA: Diagnosis not present

## 2021-02-04 DIAGNOSIS — R972 Elevated prostate specific antigen [PSA]: Secondary | ICD-10-CM | POA: Diagnosis not present

## 2021-02-04 NOTE — Progress Notes (Signed)
[place] at [facility] [address] [place], [place] [address] [phone]  Date:  02/07/2021   Name:  John Wagner.   DOB:  [birth year]   MRN:  [id number]  PCP:  Darreld Mclean, MD    Chief Complaint: Anxiety (6 month follow up/)   History of Present Illness:  John Wagner. is a 70 y.o. 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  He moves around a lot at his job as well   12/25/2020  08/06/2020   1  Alprazolam 1 Mg Tablet  60.00  30  Je Cop  [id number]  Har ([id number])  3/3  4.00 LME  Medicare  [place]    11/27/2020  08/06/2020   1  Alprazolam 1 Mg Tablet  60.00  30  Je Cop  [id number]  Har ([id number])  2/3  4.00 LME  Medicare  [place]    10/29/2020  08/06/2020   1  Alprazolam 1 Mg Tablet  60.00  30  Je Cop  [id number]  Har ([id number])  1/3  4.00 LME  Medicare  [place]    09/29/2020  08/06/2020   1  Alprazolam 1 Mg Tablet  60.00  30  Je  Cop  [id number]  Har ([id number])  0/3  4.00 LME  Medicare  [place]    09/25/2020  08/06/2020   1  Tramadol Hcl 50 Mg Tablet  30.00  10  Je Cop  [id number]  Har ([id number])  2/2  15.00 MME  Comm Ins  [place]    09/02/2020  07/05/2020   1  Alprazolam 1 Mg Tablet  40.00  30  Je Cop  [id number]  Har ([id number])  2/2  2.67 LME  Medicare  [place]    09/02/2020  08/06/2020   1  Tramadol Hcl 50 Mg Tablet  30.00  10  Je Cop           Patient Active Problem List   Diagnosis Date Noted   Elevated PSA    Pre-diabetes 01/24/2016   BPH (benign prostatic hyperplasia) 12/25/2014   Erectile dysfunction 03/10/2014   Anxiety 12/06/2012   Psoriasis 12/06/2012   Hypertension    Hyperlipidemia     Past Medical History:  Diagnosis Date   Anxiety    Elevated PSA    Seeing Dr. Para March  Hyperlipidemia    Hypertension    Psoriasis     Past Surgical History:  Procedure Laterality Date   APPENDECTOMY      Social History   Tobacco Use   Smoking status: Former Smoker   Smokeless tobacco: Never Used  Substance Use Topics   Alcohol use: Yes   Drug use: No    Family History  Problem Relation Age of Onset   Cancer Father        prostate cancer   Hypertension Mother    Heart disease Mother     Allergies  Allergen Reactions   Buspar [Buspirone]     CNS   Statins     myalgias    Medication list has been reviewed and updated.  Current Outpatient Medications on File Prior to Visit  Medication Sig Dispense Refill   acyclovir (ZOVIRAX) 400 MG tablet TAKE 1 TABLET BY MOUTH THREE TIMES DAILY AS NEEDED FOR OUTBREAKS 45 tablet 3   ALPRAZolam (XANAX) 1 MG tablet SEE NOTES 60 tablet 3   betamethasone valerate lotion (VALISONE) 0.1 % Apply 1 application topically 2 (two) times daily. Reported on 01/23/2016     hydrOXYzine (ATARAX/VISTARIL) 25 MG tablet Take 1 tablet (25 mg total) by mouth every 8 (eight) hours as needed for itching. 21 tablet 0   lisinopril (ZESTRIL) 5 MG tablet Take 1 tablet (5 mg total) by  mouth daily. 90 tablet 3   propranolol (INDERAL) 10 MG tablet Use twice a day as needed for performance anxiety 30 tablet 3   tadalafil (CIALIS) 20 MG tablet Take 1 tablet (20 mg total) by mouth daily as needed for erectile dysfunction. 30 tablet 2   traMADol (ULTRAM) 50 MG tablet Take 1 tablet (50 mg total) by mouth every 8 (eight) hours as needed. 30 tablet 2   No current facility-administered medications on file prior to visit.    Review of Systems:  As per HPI- otherwise negative.   Physical Examination: Vitals:   02/07/21 0839  BP: 128/78  Pulse: 67  Resp: 16  SpO2: 98%   Vitals:   02/07/21 0839  Weight: 178 lb (80.7 kg)  Height: 5\' 6"  (1.676 m)   Body mass index is 28.73 kg/m. Ideal Body Weight: Weight in (lb) to have BMI = 25: 154.6  GEN: no acute distress.  Mild overweight, looks well  HEENT: Atraumatic, Normocephalic.  Ears and Nose: No external deformity. CV: RRR, No M/G/R. No JVD. No thrill. No extra heart sounds. PULM: CTA B, no wheezes, crackles, rhonchi. No retractions. No resp. distress. No accessory muscle use. ABD: S, NT, ND. No rebound. No HSM.  Benign belly  EXTR: No c/c/e PSYCH: Normally interactive. Conversant.  Right IT band area is tender to palpation Hip joint is normal, no pain with internal or external rotation, flexion  Assessment and Plan: Essential hypertension  Erectile dysfunction, unspecified erectile dysfunction type - Plan: tadalafil (CIALIS) 20 MG tablet  Chronic knee pain, unspecified laterality - Plan: traMADol (ULTRAM) 50 MG tablet  Pre-diabetes  GAD (generalized anxiety disorder) - Plan: ALPRAZolam (XANAX) 1 MG tablet, propranolol (INDERAL) 10 MG tablet  Wound of right lower extremity, initial encounter - Plan: Td vaccine greater than or equal to 7yo preservative free IM  Chronic right shoulder pain - Plan: traMADol (ULTRAM) 50 MG tablet  Performance anxiety - Plan: propranolol (INDERAL) 10 MG tablet  It band  syndrome, right  Influenza vaccine administered - Plan: Flu Vaccine QUAD High Dose(Fluad)  Periodic follow-up visit today  Refilled medications as above Recent dog bite- now healed.  Update tetanus Flu vaccine Discussed IT band symptoms, gave hand out with exercises and stretches BP under good control  Visit in 6 months  This visit occurred during the SARS-CoV-2 public health emergency.  Safety protocols were in place, including screening questions prior to the visit, additional usage of staff PPE, and extensive cleaning of exam room while observing appropriate contact time as indicated for disinfecting solutions.    Signed Lamar Blinks, MD

## 2021-02-04 NOTE — Patient Instructions (Incomplete)
It was good to see you again today, assuming all is well let's visit in 6 months Tetanus booster and flu shot today Please consider getting your shingles vaccine (shingrix) at your pharmacy if not done already I gave you some info about IT band pain. Please try some of the exercises and stretches described, let me know if this is not getting better.  A soft foam pad to put under the area in bed may also help

## 2021-02-07 ENCOUNTER — Ambulatory Visit (INDEPENDENT_AMBULATORY_CARE_PROVIDER_SITE_OTHER): Payer: Medicare Other | Admitting: Family Medicine

## 2021-02-07 ENCOUNTER — Encounter: Payer: Self-pay | Admitting: Family Medicine

## 2021-02-07 ENCOUNTER — Other Ambulatory Visit: Payer: Self-pay

## 2021-02-07 VITALS — BP 128/78 | HR 67 | Resp 16 | Ht 66.0 in | Wt 178.0 lb

## 2021-02-07 DIAGNOSIS — Z23 Encounter for immunization: Secondary | ICD-10-CM | POA: Diagnosis not present

## 2021-02-07 DIAGNOSIS — G8929 Other chronic pain: Secondary | ICD-10-CM | POA: Diagnosis not present

## 2021-02-07 DIAGNOSIS — F411 Generalized anxiety disorder: Secondary | ICD-10-CM | POA: Diagnosis not present

## 2021-02-07 DIAGNOSIS — M7631 Iliotibial band syndrome, right leg: Secondary | ICD-10-CM

## 2021-02-07 DIAGNOSIS — M25569 Pain in unspecified knee: Secondary | ICD-10-CM

## 2021-02-07 DIAGNOSIS — I10 Essential (primary) hypertension: Secondary | ICD-10-CM | POA: Diagnosis not present

## 2021-02-07 DIAGNOSIS — N529 Male erectile dysfunction, unspecified: Secondary | ICD-10-CM | POA: Diagnosis not present

## 2021-02-07 DIAGNOSIS — M25511 Pain in right shoulder: Secondary | ICD-10-CM

## 2021-02-07 DIAGNOSIS — S81801A Unspecified open wound, right lower leg, initial encounter: Secondary | ICD-10-CM | POA: Diagnosis not present

## 2021-02-07 DIAGNOSIS — R7303 Prediabetes: Secondary | ICD-10-CM

## 2021-02-07 DIAGNOSIS — F418 Other specified anxiety disorders: Secondary | ICD-10-CM | POA: Diagnosis not present

## 2021-02-07 MED ORDER — ALPRAZOLAM 1 MG PO TABS: ORAL_TABLET | ORAL | 3 refills | Status: DC

## 2021-02-07 MED ORDER — TRAMADOL HCL 50 MG PO TABS: 50.0000 mg | ORAL_TABLET | Freq: Three times a day (TID) | ORAL | 2 refills | Status: DC | PRN

## 2021-02-07 MED ORDER — PROPRANOLOL HCL 10 MG PO TABS: ORAL_TABLET | ORAL | 3 refills | Status: DC

## 2021-02-07 MED ORDER — TADALAFIL 20 MG PO TABS: 20.0000 mg | ORAL_TABLET | Freq: Every day | ORAL | 2 refills | Status: DC | PRN

## 2021-02-11 ENCOUNTER — Encounter: Payer: Self-pay | Admitting: Family Medicine

## 2021-02-13 ENCOUNTER — Ambulatory Visit: Payer: Medicare Other | Admitting: Family Medicine

## 2021-02-16 ENCOUNTER — Encounter: Payer: Self-pay | Admitting: Family Medicine

## 2021-02-25 DIAGNOSIS — H2513 Age-related nuclear cataract, bilateral: Secondary | ICD-10-CM | POA: Diagnosis not present

## 2021-02-25 DIAGNOSIS — H5310 Unspecified subjective visual disturbances: Secondary | ICD-10-CM | POA: Diagnosis not present

## 2021-03-08 ENCOUNTER — Other Ambulatory Visit: Payer: Self-pay

## 2021-03-08 ENCOUNTER — Encounter: Payer: Self-pay | Admitting: Family Medicine

## 2021-03-08 ENCOUNTER — Ambulatory Visit (INDEPENDENT_AMBULATORY_CARE_PROVIDER_SITE_OTHER): Payer: Medicare Other | Admitting: Family Medicine

## 2021-03-08 VITALS — BP 122/86 | HR 65 | Temp 98.3°F | Resp 16 | Wt 179.4 lb

## 2021-03-08 DIAGNOSIS — I889 Nonspecific lymphadenitis, unspecified: Secondary | ICD-10-CM | POA: Diagnosis not present

## 2021-03-08 MED ORDER — PREDNISONE 20 MG PO TABS: 40.0000 mg | ORAL_TABLET | Freq: Every day | ORAL | 0 refills | Status: AC

## 2021-03-08 MED ORDER — AMOXICILLIN-POT CLAVULANATE 875-125 MG PO TABS: 1.0000 | ORAL_TABLET | Freq: Two times a day (BID) | ORAL | 0 refills | Status: AC

## 2021-03-08 MED ORDER — OXYCODONE HCL 5 MG PO TABS: 5.0000 mg | ORAL_TABLET | ORAL | 0 refills | Status: DC | PRN

## 2021-03-08 NOTE — Patient Instructions (Signed)
Ice/cold pack over area for 10-15 min twice daily.  OK to take Tylenol 1000 mg (2 extra strength tabs) or 975 mg (3 regular strength tabs) every 6 hours as needed.  Do not drink alcohol, do any illicit/street drugs, drive or do anything that requires alertness while on this medicine.   Let me know if things aren't turning the corner.   Let us know if you need anything.

## 2021-03-08 NOTE — Progress Notes (Signed)
CC: Pain behind ear  Subjective: Patient is a 70 y.o. male here for pain behind his R ear.  5 d of burning/itching. No inj. Unsure if any skin changes or swelling. No drainage. No ear issues. Denies fevers, sore throat, or other upper respiratory complaints. Has not tried anything at home so far.   Past Medical History:  Diagnosis Date  . Anxiety   . Elevated PSA    Seeing Dr. Para March  . Hyperlipidemia   . Hypertension   . Psoriasis     Objective: BP 122/86   Pulse 65   Temp 98.3 F (36.8 C)   Resp 16   Wt 179 lb 6.4 oz (81.4 kg)   SpO2 96%   BMI 28.96 kg/m  General: Awake, appears stated age HEENT: Ears negative bilaterally Skin: There appears to be a lymph node measuring 0.7 x 0.4 cm just posterior to the mastoid process with soft tissue swelling.  It is very tender to palpation.  No erythema or fluctuance.  There are no drainage, fluid-filled lesions, excoriation or crusting. Lungs: No accessory muscle use Psych: Age appropriate judgment and insight, normal affect and mood  Assessment and Plan: Lymphadenitis - Plan: predniSONE (DELTASONE) 20 MG tablet, amoxicillin-clavulanate (AUGMENTIN) 875-125 MG tablet, oxyCODONE (OXY IR/ROXICODONE) 5 MG immediate release tablet  5-day prednisone burst, 40 mg daily, 7 days of Augmentin.  Oxycodone as needed for breakthrough pain.  He will not take this with tramadol.  Ice, Tylenol.  Follow-up as needed. The patient voiced understanding and agreement to the plan.  [place], DO 03/08/21  12:13 PM

## 2021-03-14 ENCOUNTER — Ambulatory Visit: Payer: Medicare Other | Admitting: Family Medicine

## 2021-05-23 ENCOUNTER — Other Ambulatory Visit: Payer: Self-pay | Admitting: Family Medicine

## 2021-05-23 DIAGNOSIS — G8929 Other chronic pain: Secondary | ICD-10-CM

## 2021-05-23 DIAGNOSIS — M25569 Pain in unspecified knee: Secondary | ICD-10-CM

## 2021-05-23 NOTE — Telephone Encounter (Signed)
Patient is requesting a refill of the following medications: Requested Prescriptions   Pending Prescriptions Disp Refills   traMADol (ULTRAM) 50 MG tablet [Pharmacy Med Name: traMADol HCL 50MG  TABLET] 30 tablet     Sig: TAKE 1 TABLET BY MOUTH EVERY 8 HOURS AS NEEDED    Date of patient request: 05/23/21 Last office visit: 03/08/21 Nani Ravens Date of last refill: 02/07/21 Last refill amount: 30 + 2 Follow up time period per chart: 08/07/21

## 2021-06-21 ENCOUNTER — Other Ambulatory Visit: Payer: Self-pay | Admitting: Family Medicine

## 2021-06-21 DIAGNOSIS — F411 Generalized anxiety disorder: Secondary | ICD-10-CM

## 2021-07-04 ENCOUNTER — Encounter: Payer: Self-pay | Admitting: Family Medicine

## 2021-07-04 NOTE — Telephone Encounter (Signed)
Called pt back to discuss his sx No palpitations  No chest pain or tightness noted  He has not fainted  No slurred speech or difficulty walking No bad HA  Sx may last for 5 minutes or so, or sometimes longer  Both arms may feel tingly when he has an attack No slurred speech or one sided numbness or weakness We made an appt for next week- my next available.  He will seek care at the ER if sx get worse or change in the meantime

## 2021-07-09 NOTE — Patient Instructions (Addendum)
It was good to see you again today, I will be in touch with your results of soon as possible  Please consider getting the shingles vaccine and COVID fourth dose at your pharmacy if not done already   Your current symptoms, we will obtain blood work and also set you up with a wearable heart monitor which you can use for 5 to 7 days.  I will also get an ultrasound of your carotid arteries and arrange for you to see cardiology.  Please stop taking your lisinopril for now, this may be lowering your blood pressure Be sure to drink plenty of fluids especially during this hot weather, and you may want to increase your sodium intake somewhat.  Get up slowly if you have been sitting or laying down for a while Please contact me if any change or worsening of your symptoms

## 2021-07-09 NOTE — Progress Notes (Addendum)
[place] at [facility] [address] [place], [place] [address] [phone]  Date:  07/10/2021   Name:  John Wagner.   DOB:  10-Aug-1951   MRN:  [id number]  PCP:  Darreld Mclean, MD    Chief Complaint: Dizziness (Fatigue, numbness in the arms thet radiate up into the back of the palate- x 1-2 months/Strong heart beat when lying in bed. He has noticed confusion. He has a general feeling that he will faint but never does. Asks if this all could be related to the Lymphadenitis that Wendling treated him for in March./OTHER things that he has noticed: Upper leg pain- Friday night- some in the left as well./)   History of Present Illness:  John Issa. is a 70 y.o. very pleasant male patient who presents with the following:  Last seen by myself for routine visit in February. History of prediabetes, BPH, hypertension, hyperlipidemia, anxiety treated with xanax, joint pain  Patient seen today with concern of feeling lightheaded.  He reached out to me last week and we had the following exchange:   Ihave hesitated in telling you what I have been experiencing for the last 6 to 8 weeeks. I have had spels of light head,  tingling numbness,  similar to what you might to feel if you were going to faint, then it subsdes. At first it would happen maybe once in a week. Now it has increased to every day. 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 I have 2 services this Saturday... To test if was a medication I stopped taking Tramadol at tll for 4 days. I have always been steady on my 1/2 tablet at night and 1/2 tablet in the morning, Alprazolam. Still the same sensations would occurr. I admit I forget to take lisonipril sometimes. Today I felt numbness and tingling in my arms and head. 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 I don't know what is causing these sensations or if it is anything  serious or just aging?  Called pt back to discuss his sx No palpitations No chest pain or tightness noted He has not fainted No slurred speech or difficulty walking No bad HA Sx may last for 5 minutes or so, or sometimes longer Both arms may feel tingly when he has an attack No slurred speech or one sided numbness or weakness We made an appt for next week- my next available.  He will seek care at the ER if sx get worse or change in the meantime  Pt notes that he has pre-syncopal symptoms for about 8 weeks It may occur daily - up to twice a day No pattern - can occur really any time under any circumstance  Might be more likely to occur if he has not eaten or if standing  However it can occur if he is laying in bed as well No SOB or CP He felt his heart pounding the other evening  Sx may last for 30 minutes or so  He cannot make it go away by eating or drinking liquids   He has physically exerted- mowed the grass, etc and not had any sx   He does notice that his B arms may tingle when he has the lightheaded sx He also may awaken with one of his arms numb at times, but this seems to be positional and does not occur  otherwise  No other neuro sx- no slurred speech, no facial droop  BP Readings from Last 3 Encounters:  07/10/21 124/62  03/08/21 122/86  02/07/21 128/78    He did do a stress in 2018: Blood pressure demonstrated a hypertensive response to exercise. There was no ST segment deviation noted during stress. 1. Average exercise tolerance, hypertensive BP response. 2. No evidence for ischemia by ST segment analysis Patient Active Problem List   Diagnosis Date Noted   Elevated PSA    Pre-diabetes 01/24/2016   BPH (benign prostatic hyperplasia) 12/25/2014   Erectile dysfunction 03/10/2014   Anxiety 12/06/2012   Psoriasis 12/06/2012   Hypertension    Hyperlipidemia     Past Medical History:  Diagnosis Date   Anxiety    Elevated PSA    Seeing Dr. Para March    Hyperlipidemia    Hypertension    Psoriasis     Past Surgical History:  Procedure Laterality Date   APPENDECTOMY      Social History   Tobacco Use   Smoking status: Former   Smokeless tobacco: Never  Substance Use Topics   Alcohol use: Yes   Drug use: No    Family History  Problem Relation Age of Onset   Cancer Father        prostate cancer   Hypertension Mother    Heart disease Mother     Allergies  Allergen Reactions   Buspar [Buspirone]     CNS   Statins     myalgias    Medication list has been reviewed and updated.  Current Outpatient Medications on File Prior to Visit  Medication Sig Dispense Refill   acyclovir (ZOVIRAX) 400 MG tablet TAKE 1 TABLET BY MOUTH THREE TIMES DAILY AS NEEDED FOR OUTBREAKS 45 tablet 3   ALPRAZolam (XANAX) 1 MG tablet TAKE 1/2 TABLET BY MOUTH AT NOON AND 1/2 TABLET BY MOUTH AT BEDTIME. MAY TAKE A WHOLE TABLET IF NEEDED ON OCCASION *THIS MUST LAST AT LEAST 30 DAYS* 60 tablet 1   betamethasone valerate lotion (VALISONE) 0.1 % Apply 1 application topically 2 (two) times daily. Reported on 01/23/2016     hydrOXYzine (ATARAX/VISTARIL) 25 MG tablet Take 1 tablet (25 mg total) by mouth every 8 (eight) hours as needed for itching. 21 tablet 0   lisinopril (ZESTRIL) 5 MG tablet Take 1 tablet (5 mg total) by mouth daily. 90 tablet 3   oxyCODONE (OXY IR/ROXICODONE) 5 MG immediate release tablet Take 1 tablet (5 mg total) by mouth every 4 (four) hours as needed for severe pain. 10 tablet 0   propranolol (INDERAL) 10 MG tablet Use twice a day as needed for performance anxiety 30 tablet 3   tadalafil (CIALIS) 20 MG tablet Take 1 tablet (20 mg total) by mouth daily as needed for erectile dysfunction. 30 tablet 2   traMADol (ULTRAM) 50 MG tablet TAKE 1 TABLET BY MOUTH EVERY 8 HOURS AS NEEDED 30 tablet 1   No current facility-administered medications on file prior to visit.    Review of Systems:  As per HPI- otherwise negative.   Physical  Examination: Vitals:   07/10/21 1346  BP: 124/62  Pulse: 74  Resp: 18  Temp: 98 F (36.7 C)  SpO2: 98%   Vitals:   07/10/21 1346  Weight: 180 lb 6.4 oz (81.8 kg)  Height: 5\' 6"  (1.676 m)   Body mass index is 29.12 kg/m. Ideal Body Weight: Weight in (lb) to have BMI = 25: 154.6  GEN: no  acute distress.  Looks well, overweight HEENT: Atraumatic, Normocephalic.  Bilateral TM wnl, oropharynx normal.  PEERL,EOMI. Ears and Nose: No external deformity. CV: RRR, No M/G/R. No JVD. No thrill. No extra heart sounds. PULM: CTA B, no wheezes, crackles, rhonchi. No retractions. No resp. distress. No accessory muscle use. ABD: S, NT, ND, +BS. No rebound. No HSM. EXTR: No c/c/e PSYCH: Normally interactive. Conversant.   EKG: NSR, no concerning changes Compared with EKG from 8/18 no significant change noted   See orthostatic VS- increase in pulse from supine to standing about 15 BPM Assessment and Plan: Pre-syncope - Plan: EKG 12-Lead, CBC, Comprehensive metabolic panel, TSH, US Carotid Duplex Bilateral, LONG TERM MONITOR (3-14 DAYS), Ambulatory referral to Cardiology  Fatigue, unspecified type - Plan: TSH  Pt seen today with episodic pre-syncope/ lightheadedness for about 2 months  On exam, it does seem that he may be having orthostatic hypotension Will DC lisinopril for now Increase fluid and salt intake Carotid US Zio patch If any worsening or change he will contact me  This visit occurred during the SARS-CoV-2 public health emergency.  Safety protocols were in place, including screening questions prior to the visit, additional usage of staff PPE, and extensive cleaning of exam room while observing appropriate contact time as indicated for disinfecting solutions.   Signed Lamar Blinks, MD  Received labs as below, message to patient Addendum 7/21 Results for orders placed or performed in visit on 07/10/21  CBC  Result Value Ref Range   WBC 5.6 4.0 - 10.5 K/uL   RBC 4.88  4.22 - 5.81 Mil/uL   Platelets 354.0 150.0 - 400.0 K/uL   Hemoglobin 14.3 13.0 - 17.0 g/dL   HCT 43.1 39.0 - 52.0 %   MCV 88.3 78.0 - 100.0 fl   MCHC 33.2 30.0 - 36.0 g/dL   RDW 14.1 11.5 - 15.5 %  Comprehensive metabolic panel  Result Value Ref Range   Sodium 139 135 - 145 mEq/L   Potassium 4.7 3.5 - 5.1 mEq/L   Chloride 104 96 - 112 mEq/L   CO2 27 19 - 32 mEq/L   Glucose, Bld 94 70 - 99 mg/dL   BUN 17 6 - 23 mg/dL   Creatinine, Ser 1.06 0.40 - 1.50 mg/dL   Total Bilirubin 0.6 0.2 - 1.2 mg/dL   Alkaline Phosphatase 42 39 - 117 U/L   AST 16 0 - 37 U/L   ALT 18 0 - 53 U/L   Total Protein 6.4 6.0 - 8.3 g/dL   Albumin 4.3 3.5 - 5.2 g/dL   GFR 72.18 >60.00 mL/min   Calcium 9.2 8.4 - 10.5 mg/dL  TSH  Result Value Ref Range   TSH 1.51 0.35 - 5.50 uIU/mL

## 2021-07-10 ENCOUNTER — Ambulatory Visit (INDEPENDENT_AMBULATORY_CARE_PROVIDER_SITE_OTHER): Payer: Medicare Other

## 2021-07-10 ENCOUNTER — Ambulatory Visit (INDEPENDENT_AMBULATORY_CARE_PROVIDER_SITE_OTHER): Payer: Medicare Other | Admitting: Family Medicine

## 2021-07-10 ENCOUNTER — Other Ambulatory Visit: Payer: Self-pay

## 2021-07-10 VITALS — BP 124/62 | HR 74 | Temp 98.0°F | Resp 18 | Ht 66.0 in | Wt 180.4 lb

## 2021-07-10 DIAGNOSIS — R55 Syncope and collapse: Secondary | ICD-10-CM

## 2021-07-10 DIAGNOSIS — R5383 Other fatigue: Secondary | ICD-10-CM

## 2021-07-10 NOTE — Progress Notes (Unsigned)
Enrolled patient for a 7 day Zio XT monitor to be mailed to patients home.  

## 2021-07-11 ENCOUNTER — Ambulatory Visit (HOSPITAL_BASED_OUTPATIENT_CLINIC_OR_DEPARTMENT_OTHER)
Admission: RE | Admit: 2021-07-11 | Discharge: 2021-07-11 | Disposition: A | Discharge status: Home / Self Care | Payer: Medicare Other | Source: Ambulatory Visit | Attending: Family Medicine | Admitting: Family Medicine

## 2021-07-11 ENCOUNTER — Encounter: Payer: Self-pay | Admitting: Family Medicine

## 2021-07-11 DIAGNOSIS — R55 Syncope and collapse: Secondary | ICD-10-CM

## 2021-07-11 DIAGNOSIS — I6523 Occlusion and stenosis of bilateral carotid arteries: Secondary | ICD-10-CM | POA: Diagnosis not present

## 2021-07-11 LAB — COMPREHENSIVE METABOLIC PANEL
ALT: 18 U/L (ref 0–53)
AST: 16 U/L (ref 0–37)
Albumin: 4.3 g/dL (ref 3.5–5.2)
Alkaline Phosphatase: 42 U/L (ref 39–117)
BUN: 17 mg/dL (ref 6–23)
CO2: 27 mEq/L (ref 19–32)
Calcium: 9.2 mg/dL (ref 8.4–10.5)
Chloride: 104 mEq/L (ref 96–112)
Creatinine, Ser: 1.06 mg/dL (ref 0.40–1.50)
GFR: 72.18 mL/min (ref >60.00)
Glucose, Bld: 94 mg/dL (ref 70–99)
Potassium: 4.7 mEq/L (ref 3.5–5.1)
Sodium: 139 mEq/L (ref 135–145)
Total Bilirubin: 0.6 mg/dL (ref 0.2–1.2)
Total Protein: 6.4 g/dL (ref 6.0–8.3)

## 2021-07-11 LAB — CBC
HCT: 43.1 % (ref 39.0–52.0)
Hemoglobin: 14.3 g/dL (ref 13.0–17.0)
MCHC: 33.2 g/dL (ref 30.0–36.0)
MCV: 88.3 fl (ref 78.0–100.0)
Platelets: 354 10*3/uL (ref 150.0–400.0)
RBC: 4.88 Mil/uL (ref 4.22–5.81)
RDW: 14.1 % (ref 11.5–15.5)
WBC: 5.6 10*3/uL (ref 4.0–10.5)

## 2021-07-11 LAB — TSH: TSH: 1.51 u[IU]/mL (ref 0.35–5.50)

## 2021-07-13 DIAGNOSIS — R55 Syncope and collapse: Secondary | ICD-10-CM

## 2021-07-22 DIAGNOSIS — R42 Dizziness and giddiness: Secondary | ICD-10-CM

## 2021-07-22 DIAGNOSIS — R55 Syncope and collapse: Secondary | ICD-10-CM

## 2021-07-22 HISTORY — DX: Dizziness and giddiness: R42

## 2021-07-22 HISTORY — DX: Syncope and collapse: R55

## 2021-07-24 ENCOUNTER — Encounter: Payer: Self-pay | Admitting: Family Medicine

## 2021-07-24 DIAGNOSIS — R55 Syncope and collapse: Secondary | ICD-10-CM | POA: Diagnosis not present

## 2021-07-29 DIAGNOSIS — R972 Elevated prostate specific antigen [PSA]: Secondary | ICD-10-CM | POA: Diagnosis not present

## 2021-08-05 DIAGNOSIS — N400 Enlarged prostate without lower urinary tract symptoms: Secondary | ICD-10-CM | POA: Diagnosis not present

## 2021-08-05 DIAGNOSIS — R972 Elevated prostate specific antigen [PSA]: Secondary | ICD-10-CM | POA: Diagnosis not present

## 2021-08-06 DIAGNOSIS — M1712 Unilateral primary osteoarthritis, left knee: Secondary | ICD-10-CM | POA: Diagnosis not present

## 2021-08-07 ENCOUNTER — Other Ambulatory Visit: Payer: Self-pay | Admitting: Urology

## 2021-08-07 ENCOUNTER — Ambulatory Visit: Payer: Medicare Other | Admitting: Family Medicine

## 2021-08-07 DIAGNOSIS — R972 Elevated prostate specific antigen [PSA]: Secondary | ICD-10-CM

## 2021-08-08 ENCOUNTER — Encounter: Payer: Self-pay | Admitting: Cardiology

## 2021-08-08 ENCOUNTER — Other Ambulatory Visit: Payer: Self-pay

## 2021-08-08 ENCOUNTER — Ambulatory Visit (INDEPENDENT_AMBULATORY_CARE_PROVIDER_SITE_OTHER): Payer: Medicare Other | Admitting: Cardiology

## 2021-08-08 VITALS — Ht 66.0 in | Wt 181.1 lb

## 2021-08-08 DIAGNOSIS — R55 Syncope and collapse: Secondary | ICD-10-CM

## 2021-08-08 DIAGNOSIS — R42 Dizziness and giddiness: Secondary | ICD-10-CM | POA: Diagnosis not present

## 2021-08-08 DIAGNOSIS — I10 Essential (primary) hypertension: Secondary | ICD-10-CM | POA: Diagnosis not present

## 2021-08-08 DIAGNOSIS — E785 Hyperlipidemia, unspecified: Secondary | ICD-10-CM | POA: Diagnosis not present

## 2021-08-08 NOTE — Patient Instructions (Signed)
Medication Instructions:  Your physician recommends that you continue on your current medications as directed. Please refer to the Current Medication list given to you today.  *If you need a refill on your cardiac medications before your next appointment, please call your pharmacy*   Lab Work: Your physician recommends that you return for lab work in:  TODAY: [place] If you have labs (blood work) drawn today and your tests are completely normal, you will receive your results only by: [place] (if you have [place]) OR A paper copy in the mail If you have any lab test that is abnormal or we need to change your treatment, we will call you to review the results.   Testing/Procedures: Your physician has requested that you have an echocardiogram. Echocardiography is a painless test that uses sound waves to create images of your heart. It provides your doctor with information about the size and shape of your heart and how well your heart's chambers and valves are working. This procedure takes approximately one hour. There are no restrictions for this procedure.    Follow-Up: At [facility], you and your health needs are our priority.  As part of our continuing mission to provide you with exceptional heart care, we have created designated Provider Care Teams.  These Care Teams include your primary Cardiologist (physician) and Advanced Practice Providers (APPs -  Physician Assistants and Nurse Practitioners) who all work together to provide you with the care you need, when you need it.  We recommend signing up for the patient portal called "MyChart".  Sign up information is provided on this After Visit Summary.  MyChart is used to connect with patients for Virtual Visits (Telemedicine).  Patients are able to view lab/test results, encounter notes, upcoming appointments, etc.  Non-urgent messages can be sent to your provider as well.   To learn more about what you can do with MyChart, go to  [email or web address].    Your next appointment:   6 month(s)  The format for your next appointment:   In Person     Other Instructions Echocardiogram An echocardiogram is a test that uses sound waves (ultrasound) to produce images of the heart. Images from an echocardiogram can provide important information about: Heart size and shape. The size and thickness and movement of your heart's walls. Heart muscle function and strength. Heart valve function or if you have stenosis. Stenosis is when the heart valves are too narrow. If blood is flowing backward through the heart valves (regurgitation). A tumor or infectious growth around the heart valves. Areas of heart muscle that are not working well because of poor blood flow or injury from a heart attack. Aneurysm detection. An aneurysm is a weak or damaged part of an artery wall. The wall bulges out from the normal force of blood pumping through the body. Tell a health care provider about: Any allergies you have. All medicines you are taking, including vitamins, herbs, eye drops, creams, and over-the-counter medicines. Any blood disorders you have. Any surgeries you have had. Any medical conditions you have. Whether you are pregnant or may be pregnant. What are the risks? Generally, this is a safe test. However, problems may occur, including an allergic reaction to dye (contrast) that may be used during the test. What happens before the test? No specific preparation is needed. You may eat and drink normally. What happens during the test?  You will take off your clothes from the waist up and put on a hospital gown. Electrodes  or electrocardiogram (ECG)patches may be placed on your chest. The electrodes or patches are then connected to a device that monitors your heart rate and rhythm. You will lie down on a table for an ultrasound exam. A gel will be applied to your chest to help sound waves pass through your skin. A handheld  device, called a transducer, will be pressed against your chest and moved over your heart. The transducer produces sound waves that travel to your heart and bounce back (or "echo" back) to the transducer. These sound waves will be captured in real-time and changed into images of your heart that can be viewed on a video monitor. The images will be recorded on a computer and reviewed by your health care provider. You may be asked to change positions or hold your breath for a short time. This makes it easier to get different views or better views of your heart. In some cases, you may receive contrast through an IV in one of your veins. This can improve the quality of the pictures from your heart. The procedure may vary among health care providers and hospitals. What can I expect after the test? You may return to your normal, everyday life, including diet, activities, andmedicines, unless your health care provider tells you not to do that. Follow these instructions at home: It is up to you to get the results of your test. Ask your health care provider, or the department that is doing the test, when your results will be ready. Keep all follow-up visits. This is important. Summary An echocardiogram is a test that uses sound waves (ultrasound) to produce images of the heart. Images from an echocardiogram can provide important information about the size and shape of your heart, heart muscle function, heart valve function, and other possible heart problems. You do not need to do anything to prepare before this test. You may eat and drink normally. After the echocardiogram is completed, you may return to your normal, everyday life, unless your health care provider tells you not to do that. This information is not intended to replace advice given to you by your health care provider. Make sure you discuss any questions you have with your healthcare provider. Document Revised: 07/31/2020 Document Reviewed:  07/31/2020 Elsevier Patient Education  2022 [employer].

## 2021-08-08 NOTE — Progress Notes (Signed)
Cardiology Office Note:    Date:  08/09/2021   ID:  John Quince., DOB 1951/11/23, MRN [id number]  PCP:  Darreld Mclean, MD  Cardiologist:  Berniece Salines, DO  Electrophysiologist:  None   Referring MD: Darreld Mclean, MD    History of Present Illness:    John Wagner. is a 71 y.o. male with a hx of HTN, prediabetes, HLD, anxiety, and BPH who was referred due to pre-syncopal symptoms.   Patient reports numbness sensation in bilateral arms, lightheadedness, and overall feeling like he may pass out. These symptoms were present during June and July of this year, occurring approximately every other day. He saw his primary care doctor, who ordered CBC, CMP, TSH, carotid ultrasound and Zio patch. Workup was overall unremarkable, however he had orthostatic hypotension in the office so lisinopril was discontinued and he was advised to increase fluid and salt intake. Of note, he did not have any actual syncopal episodes and denied chest pain, dyspnea, palpitations, or any other neurologic symptoms aside from the numbness. These pre-syncopal episodes seemed to occur randomly (not with exertion).  Patient reports since seeing his PCP his symptoms have essentially resolved. He feels well currently.  Of note, he has been checking his BP at home and it was elevated to 0000000 and Q000111Q systolic, so he restarted the Lisinopril 3 days ago.   Past Medical History:  Diagnosis Date   Anxiety    Elevated PSA    Seeing Dr. Para March   Hyperlipidemia    Hypertension    Psoriasis     Past Surgical History:  Procedure Laterality Date   APPENDECTOMY      Current Medications: Current Meds  Medication Sig   acyclovir (ZOVIRAX) 400 MG tablet TAKE 1 TABLET BY MOUTH THREE TIMES DAILY AS NEEDED FOR OUTBREAKS   ALPRAZolam (XANAX) 1 MG tablet TAKE 1/2 TABLET BY MOUTH AT NOON AND 1/2 TABLET BY MOUTH AT BEDTIME. MAY TAKE A WHOLE TABLET IF NEEDED ON OCCASION *THIS MUST LAST AT LEAST 30 DAYS*    betamethasone valerate lotion (VALISONE) 0.1 % Apply 1 application topically 2 (two) times daily. Reported on 01/23/2016   hydrOXYzine (ATARAX/VISTARIL) 25 MG tablet Take 1 tablet (25 mg total) by mouth every 8 (eight) hours as needed for itching.   lisinopril (ZESTRIL) 5 MG tablet Take 1 tablet (5 mg total) by mouth daily.   oxyCODONE (OXY IR/ROXICODONE) 5 MG immediate release tablet Take 1 tablet (5 mg total) by mouth every 4 (four) hours as needed for severe pain.   propranolol (INDERAL) 10 MG tablet Use twice a day as needed for performance anxiety   tadalafil (CIALIS) 20 MG tablet Take 1 tablet (20 mg total) by mouth daily as needed for erectile dysfunction.   traMADol (ULTRAM) 50 MG tablet TAKE 1 TABLET BY MOUTH EVERY 8 HOURS AS NEEDED     Allergies:   Buspar [buspirone] and Statins   Social History   Socioeconomic History   Marital status: Single    Spouse name: Not on file   Number of children: Not on file   Years of education: Not on file   Highest education level: Not on file  Occupational History   Occupation: picture frame/artist    Employer: [employer]  Tobacco Use   Smoking status: Former   Smokeless tobacco: Never  Substance and Sexual Activity   Alcohol use: Yes   Drug use: No   Sexual activity: Yes  Other Topics Concern   Not  on file  Social History Narrative   Not on file   Social Determinants of Health   Financial Resource Strain: Not on file  Food Insecurity: Not on file  Transportation Needs: Not on file  Physical Activity: Not on file  Stress: Not on file  Social Connections: Not on file     Family History: The patient's family history includes Cancer in his father; Heart disease in his mother; Hypertension in his mother.  ROS:   Review of Systems  Constitution: Negative for decreased appetite, fever and weight gain.  HENT: Negative for congestion, ear discharge, hoarse voice and sore throat.   Eyes: Negative for discharge, redness, vision  loss in right eye and visual halos.  Cardiovascular: Negative for chest pain, dyspnea on exertion, leg swelling, orthopnea and palpitations.  Respiratory: Negative for cough, hemoptysis, shortness of breath and snoring.   Endocrine: Negative for heat intolerance and polyphagia.  Hematologic/Lymphatic: Negative for bleeding problem. Does not bruise/bleed easily.  Skin: Negative for flushing, nail changes, rash and suspicious lesions.  Musculoskeletal: Negative for arthritis, joint pain, muscle cramps, myalgias, neck pain and stiffness.  Gastrointestinal: Negative for abdominal pain, bowel incontinence, diarrhea and excessive appetite.  Genitourinary: Negative for decreased libido, genital sores and incomplete emptying.  Neurological: Negative for brief paralysis, focal weakness, headaches and loss of balance.  Psychiatric/Behavioral: Negative for altered mental status, depression and suicidal ideas.  Allergic/Immunologic: Negative for HIV exposure and persistent infections.    EKGs/Labs/Other Studies Reviewed:    The following studies were reviewed today:  EKG:  done by PCP on 07/10/2021- normal sinus rhythm, normal EKG  Zio monitor: sinus rhythm with rare PACs and rare PVCs  Carotid ultrasound: minor carotid intimal thickening and trace bifurcation atherosclerosis. Negative for stenosis. Degree of narrowing less than 50% bilaterally by ultrasound criteria. Patent antegrade vertebral flow bilaterally.  Recent Labs: 07/10/2021: ALT 18; BUN 17; Creatinine, Ser 1.06; Hemoglobin 14.3; Platelets 354.0; Potassium 4.7; Sodium 139; TSH 1.51  Recent Lipid Panel    Component Value Date/Time   CHOL 197 08/06/2020 1004   TRIG 109.0 08/06/2020 1004   HDL 44.40 08/06/2020 1004   CHOLHDL 4 08/06/2020 1004   VLDL 21.8 08/06/2020 1004   LDLCALC 131 (H) 08/06/2020 1004    Physical Exam:    VS:  Ht '5\' 6"'$  (1.676 m)   Wt 181 lb 1.3 oz (82.1 kg)   SpO2 97%   BMI 29.23 kg/m     Wt Readings from  Last 3 Encounters:  08/08/21 181 lb 1.3 oz (82.1 kg)  07/10/21 180 lb 6.4 oz (81.8 kg)  03/08/21 179 lb 6.4 oz (81.4 kg)     GEN: Well nourished, well developed in no acute distress HEENT: Normal NECK: No JVD; No carotid bruits LYMPHATICS: No lymphadenopathy CARDIAC: S1S2 noted,RRR, no murmurs, rubs, gallops RESPIRATORY:  Clear to auscultation without rales, wheezing or rhonchi  ABDOMEN: Soft, non-tender, non-distended, +bowel sounds, no guarding. EXTREMITIES: Trace bilateral lower extremity edema, No cyanosis, no clubbing MUSCULOSKELETAL:  No deformity  SKIN: Warm and dry NEUROLOGIC:  Alert and oriented x 3, non-focal PSYCHIATRIC:  Normal affect, good insight  ASSESSMENT:    1. Hypertension, unspecified type   2. Hyperlipidemia, unspecified hyperlipidemia type   3. Postural dizziness with presyncope    PLAN:    HLD: Lipid panel from 2021 shows elevated LDL to 131. His ASCVD risk was calculated to be 24.7% today, which is significantly elevated. He has been unable to tolerate statin therapy in the past due  to myalgias. Will check lipoprotein A level and refer to lipid clinic for further management.   HTN: BP elevated at today's visit to Q000111Q systolic. No orthostatic changes. Continue Lisinopril '5mg'$  daily. In light of his presyncopal symptoms, will check echocardiogram as patient also has longstanding hx of HTN.  Presyncope: fortunately patient's symptoms have mostly resolved. Thorough workup by PCP which included Zio patch and carotid dopplers was unremarkable. Possibly related to orthostatic hypotension previously, but has now improved with adequate hydration. No anginal equivalents. No ischemic workup needed at this time. Echo ordered as discussed above.  The patient is in agreement with the above plan. The patient left the office in stable condition.  The patient will follow up in 6 months.   Medication Adjustments/Labs and Tests Ordered: Current medicines are reviewed at  length with the patient today.  Concerns regarding medicines are outlined above.  Orders Placed This Encounter  Procedures   Lipoprotein A (LPA)   AMB Referral to [facility]   ECHOCARDIOGRAM COMPLETE   No orders of the defined types were placed in this encounter.   Patient Instructions  Medication Instructions:  Your physician recommends that you continue on your current medications as directed. Please refer to the Current Medication list given to you today.  *If you need a refill on your cardiac medications before your next appointment, please call your pharmacy*   Lab Work: Your physician recommends that you return for lab work in:  TODAY: [place] If you have labs (blood work) drawn today and your tests are completely normal, you will receive your results only by: [place] (if you have [place]) OR A paper copy in the mail If you have any lab test that is abnormal or we need to change your treatment, we will call you to review the results.   Testing/Procedures: Your physician has requested that you have an echocardiogram. Echocardiography is a painless test that uses sound waves to create images of your heart. It provides your doctor with information about the size and shape of your heart and how well your heart's chambers and valves are working. This procedure takes approximately one hour. There are no restrictions for this procedure.    Follow-Up: At [facility], you and your health needs are our priority.  As part of our continuing mission to provide you with exceptional heart care, we have created designated Provider Care Teams.  These Care Teams include your primary Cardiologist (physician) and Advanced Practice Providers (APPs -  Physician Assistants and Nurse Practitioners) who all work together to provide you with the care you need, when you need it.  We recommend signing up for the patient portal called "MyChart".  Sign up information is provided  on this After Visit Summary.  MyChart is used to connect with patients for Virtual Visits (Telemedicine).  Patients are able to view lab/test results, encounter notes, upcoming appointments, etc.  Non-urgent messages can be sent to your provider as well.   To learn more about what you can do with MyChart, go to [email or web address].    Your next appointment:   6 month(s)  The format for your next appointment:   In Person     Other Instructions Echocardiogram An echocardiogram is a test that uses sound waves (ultrasound) to produce images of the heart. Images from an echocardiogram can provide important information about: Heart size and shape. The size and thickness and movement of your heart's walls. Heart muscle function and strength. Heart valve function or  if you have stenosis. Stenosis is when the heart valves are too narrow. If blood is flowing backward through the heart valves (regurgitation). A tumor or infectious growth around the heart valves. Areas of heart muscle that are not working well because of poor blood flow or injury from a heart attack. Aneurysm detection. An aneurysm is a weak or damaged part of an artery wall. The wall bulges out from the normal force of blood pumping through the body. Tell a health care provider about: Any allergies you have. All medicines you are taking, including vitamins, herbs, eye drops, creams, and over-the-counter medicines. Any blood disorders you have. Any surgeries you have had. Any medical conditions you have. Whether you are pregnant or may be pregnant. What are the risks? Generally, this is a safe test. However, problems may occur, including an allergic reaction to dye (contrast) that may be used during the test. What happens before the test? No specific preparation is needed. You may eat and drink normally. What happens during the test?  You will take off your clothes from the waist up and put on a hospital  gown. Electrodes or electrocardiogram (ECG)patches may be placed on your chest. The electrodes or patches are then connected to a device that monitors your heart rate and rhythm. You will lie down on a table for an ultrasound exam. A gel will be applied to your chest to help sound waves pass through your skin. A handheld device, called a transducer, will be pressed against your chest and moved over your heart. The transducer produces sound waves that travel to your heart and bounce back (or "echo" back) to the transducer. These sound waves will be captured in real-time and changed into images of your heart that can be viewed on a video monitor. The images will be recorded on a computer and reviewed by your health care provider. You may be asked to change positions or hold your breath for a short time. This makes it easier to get different views or better views of your heart. In some cases, you may receive contrast through an IV in one of your veins. This can improve the quality of the pictures from your heart. The procedure may vary among health care providers and hospitals. What can I expect after the test? You may return to your normal, everyday life, including diet, activities, andmedicines, unless your health care provider tells you not to do that. Follow these instructions at home: It is up to you to get the results of your test. Ask your health care provider, or the department that is doing the test, when your results will be ready. Keep all follow-up visits. This is important. Summary An echocardiogram is a test that uses sound waves (ultrasound) to produce images of the heart. Images from an echocardiogram can provide important information about the size and shape of your heart, heart muscle function, heart valve function, and other possible heart problems. You do not need to do anything to prepare before this test. You may eat and drink normally. After the echocardiogram is completed, you  may return to your normal, everyday life, unless your health care provider tells you not to do that. This information is not intended to replace advice given to you by your health care provider. Make sure you discuss any questions you have with your healthcare provider. Document Revised: 07/31/2020 Document Reviewed: 07/31/2020 Elsevier Patient Education  2022 [place].    Adopting a Healthy Lifestyle.  Know what a healthy  weight is for you (roughly BMI <25) and aim to maintain this   Aim for 7+ servings of fruits and vegetables daily   65-80+ fluid ounces of water or unsweet tea for healthy kidneys   Limit to max 1 drink of alcohol per day; avoid smoking/tobacco   Limit animal fats in diet for cholesterol and heart health - choose grass fed whenever available   Avoid highly processed foods, and foods high in saturated/trans fats   Aim for low stress - take time to unwind and care for your mental health   Aim for 150 min of moderate intensity exercise weekly for heart health, and weights twice weekly for bone health   Aim for 7-9 hours of sleep daily   When it comes to diets, agreement about the perfect plan isnt easy to find, even among the experts. Experts at the [employer] developed an idea known as the Healthy Eating Plate. Just imagine a plate divided into logical, healthy portions.   The emphasis is on diet quality:   Load up on vegetables and fruits - one-half of your plate: Aim for color and variety, and remember that potatoes dont count.   Go for whole grains - one-quarter of your plate: Whole wheat, barley, wheat berries, quinoa, oats, brown rice, and foods made with them. If you want pasta, go with whole wheat pasta.   Protein power - one-quarter of your plate: Fish, chicken, beans, and nuts are all healthy, versatile protein sources. Limit red meat.   The diet, however, does go beyond the plate, offering a few other suggestions.   Use  healthy plant oils, such as olive, canola, soy, corn, sunflower and peanut. Check the labels, and avoid partially hydrogenated oil, which have unhealthy trans fats.   If youre thirsty, drink water. Coffee and tea are good in moderation, but skip sugary drinks and limit milk and dairy products to one or two daily servings.   The type of carbohydrate in the diet is more important than the amount. Some sources of carbohydrates, such as vegetables, fruits, whole grains, and beans-are healthier than others.   Finally, stay active  Signed, Berniece Salines, DO  08/09/2021 1:02 PM    [place]

## 2021-08-10 LAB — LIPOPROTEIN A (LPA): Lipoprotein (a): 13.8 nmol/L (ref <75.0)

## 2021-08-13 ENCOUNTER — Ambulatory Visit (HOSPITAL_BASED_OUTPATIENT_CLINIC_OR_DEPARTMENT_OTHER)
Admission: RE | Admit: 2021-08-13 | Discharge: 2021-08-13 | Disposition: A | Discharge status: Home / Self Care | Payer: Medicare Other | Source: Ambulatory Visit | Attending: Cardiology | Admitting: Cardiology

## 2021-08-13 ENCOUNTER — Other Ambulatory Visit: Payer: Self-pay

## 2021-08-13 DIAGNOSIS — R55 Syncope and collapse: Secondary | ICD-10-CM | POA: Insufficient documentation

## 2021-08-13 DIAGNOSIS — R42 Dizziness and giddiness: Secondary | ICD-10-CM | POA: Insufficient documentation

## 2021-08-13 LAB — ECHOCARDIOGRAM COMPLETE
AR max vel: 2.7 cm2
AV Area VTI: 2.71 cm2
AV Area mean vel: 3.01 cm2
AV Mean grad: 3 mmHg
AV Peak grad: 6.1 mmHg
Ao pk vel: 1.23 m/s
Area-P 1/2: 1.42 cm2
Calc EF: 42 %
P 1/2 time: 564 msec
S' Lateral: 3.04 cm
Single Plane A2C EF: 14.3 %
Single Plane A4C EF: 60.8 %

## 2021-08-13 NOTE — Progress Notes (Signed)
*  PRELIMINARY RESULTS* Echocardiogram 2D Echocardiogram has been performed.  Luisa Hart RDCS 08/13/2021, 8:48 AM

## 2021-08-19 ENCOUNTER — Ambulatory Visit (INDEPENDENT_AMBULATORY_CARE_PROVIDER_SITE_OTHER): Payer: Medicare Other | Admitting: Pharmacist

## 2021-08-19 ENCOUNTER — Encounter: Payer: Self-pay | Admitting: Pharmacist

## 2021-08-19 ENCOUNTER — Telehealth: Payer: Self-pay | Admitting: Pharmacist

## 2021-08-19 ENCOUNTER — Other Ambulatory Visit: Payer: Self-pay

## 2021-08-19 ENCOUNTER — Other Ambulatory Visit: Payer: Self-pay | Admitting: Family Medicine

## 2021-08-19 DIAGNOSIS — E785 Hyperlipidemia, unspecified: Secondary | ICD-10-CM | POA: Diagnosis not present

## 2021-08-19 DIAGNOSIS — G8929 Other chronic pain: Secondary | ICD-10-CM

## 2021-08-19 DIAGNOSIS — M25569 Pain in unspecified knee: Secondary | ICD-10-CM

## 2021-08-19 DIAGNOSIS — M25511 Pain in right shoulder: Secondary | ICD-10-CM

## 2021-08-19 NOTE — Telephone Encounter (Signed)
Please complete prior authorization for:  Name of medication, dose, and frequency Repatha '140mg'$  SQ Q 14 days or Praluent '150mg'$  SQ Q 14 days  Lab Orders Requested? yes  Which labs? Lipid panel  Estimated date for labs to be scheduled 2-3 months after starting medication  Does patient need activated copay card? no

## 2021-08-19 NOTE — Patient Instructions (Signed)
It was nice meeting you today!  We would like to have your LDL (bad cholesterol) to be less than 70  We would like to start a new medication that you will inject once every 2 weeks  We will complete the prior authorization for you and call you when it is approved  Once you start the medication we will recheck your cholesterol in 2-3 months  Please call with any questions!  Karren Cobble, PharmD, BCACP, [place], [place] [id number], [place], [place] [address] Phone: [phone]; Fax: [phone] 08/19/2021 11:44 AM

## 2021-08-19 NOTE — Progress Notes (Signed)
Patient ID: John Wagner.                 DOB: [birth year]                    MRN: [id number]     HPI: John Wagner. is a 70 y.o. male patient referred to lipid clinic by Dr Harriet Masson. PMH is significant for HTN, pre DM, HLD, and anxiety.  Patient presents today in good spirits.  Reports intolerance to statins. Has trialed and failed simvastatin and atorvastatin. Both caused muscle pain which resolved within 3 days after discontinuing medication.  Eats a heart healthy diet.   Breakfast is yogurt and banana.  Lunch is a kale salad with feta cheese.  Dinner is chicken and zucchini or squash.  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  Is not sure if he will be able to afford copays for brand name medications.    Current Medications: n/a Intolerances: simvastatin (myalgia) Risk Factors: HTN, HLD LDL goal: <70  Labs:TC 197, Trigs 109, HDL 44, LDL 131 (08/06/20 - not on any lipid lowering therapy)  Past Medical History:  Diagnosis Date   Anxiety    Elevated PSA    Seeing Dr. Para March   Hyperlipidemia    Hypertension    Psoriasis     Current Outpatient Medications on File Prior to Visit  Medication Sig Dispense Refill   acyclovir (ZOVIRAX) 400 MG tablet TAKE 1 TABLET BY MOUTH THREE TIMES DAILY AS NEEDED FOR OUTBREAKS 45 tablet 3   ALPRAZolam (XANAX) 1 MG tablet TAKE 1/2 TABLET BY MOUTH AT NOON AND 1/2 TABLET BY MOUTH AT BEDTIME. MAY TAKE A WHOLE TABLET IF NEEDED ON OCCASION *THIS MUST LAST AT LEAST 30 DAYS* 60 tablet 1   betamethasone valerate lotion (VALISONE) 0.1 % Apply 1 application topically 2 (two) times daily. Reported on 01/23/2016     hydrOXYzine (ATARAX/VISTARIL) 25 MG tablet Take 1 tablet (25 mg total) by mouth every 8 (eight) hours as needed for itching. 21 tablet 0   lisinopril (ZESTRIL) 5 MG tablet Take 1 tablet (5 mg total) by mouth daily. 90 tablet 3   oxyCODONE (OXY IR/ROXICODONE) 5  MG immediate release tablet Take 1 tablet (5 mg total) by mouth every 4 (four) hours as needed for severe pain. 10 tablet 0   propranolol (INDERAL) 10 MG tablet Use twice a day as needed for performance anxiety 30 tablet 3   tadalafil (CIALIS) 20 MG tablet Take 1 tablet (20 mg total) by mouth daily as needed for erectile dysfunction. 30 tablet 2   traMADol (ULTRAM) 50 MG tablet TAKE 1 TABLET BY MOUTH EVERY 8 HOURS AS NEEDED 30 tablet 1   No current facility-administered medications on file prior to visit.    Allergies  Allergen Reactions   Buspar [Buspirone]     CNS   Statins     myalgias    Assessment/Plan:  1. Hyperlipidemia - Patient LDL 131 which is above goal of <70 however patient not on any lipid lowering medications due to statin intolerance.    Current ASCVD 10 year risk Risk 17.8%.  Patient interested in starting PCSK9i. Using demo pen, educated patient on mechanism of action, sotrage, site selection, administration, and possible side effects.  Patient willing to try. Will complete PA and contact patient when complete.  May need to apply for patient assistance depending on copay.  Recheck lipid panel in 2-3 months.  Start Repatha/Praluen Q 14 days Recheck lipid panel in 2-3 months  Karren Cobble, PharmD, BCACP, [place], [place] [id number] [place], [place] [address] Phone: [phone]; Fax: [phone] 08/19/2021 12:54 PM

## 2021-08-20 MED ORDER — REPATHA SURECLICK 140 MG/ML ~~LOC~~ SOAJ
140.0000 mg | SUBCUTANEOUS | 11 refills | Status: DC
Start: 2021-08-20 — End: 2022-08-27

## 2021-08-20 NOTE — Telephone Encounter (Signed)
Called and spoke w/pharmacy to ask why the copay was so high and they stated they had a deductible that they have to meet and they aren't sure what the copay will be after that

## 2021-08-20 NOTE — Telephone Encounter (Signed)
PT CALLED ASKING WHY ITS $494 I NEED TO CALL THE PHARMACY AND FIND OUT WHY ITS SO HIGH. PHARMACY WAS AT LUNCH.

## 2021-08-20 NOTE — Telephone Encounter (Signed)
Called and spoke w/pt and they stated that they called [employer] while I called the pharmd and they also determined they need a deductible to be met but it would still be a cost of $89 so he asked for a tier exception so we will be awaiting the forms.

## 2021-08-20 NOTE — Telephone Encounter (Signed)
[place], [facility], INSTRUCTED THE PT TO COMPLETE FASTING LABS POST 4TH DOSE AND TO CALL BACK IF COST PROHIBITIVE

## 2021-08-25 ENCOUNTER — Ambulatory Visit
Admission: RE | Admit: 2021-08-25 | Discharge: 2021-08-25 | Disposition: A | Discharge status: Home / Self Care | Payer: Medicare Other | Source: Ambulatory Visit | Attending: Urology | Admitting: Urology

## 2021-08-25 DIAGNOSIS — N402 Nodular prostate without lower urinary tract symptoms: Secondary | ICD-10-CM | POA: Diagnosis not present

## 2021-08-25 DIAGNOSIS — R972 Elevated prostate specific antigen [PSA]: Secondary | ICD-10-CM | POA: Diagnosis not present

## 2021-08-25 DIAGNOSIS — R590 Localized enlarged lymph nodes: Secondary | ICD-10-CM | POA: Diagnosis not present

## 2021-08-25 MED ORDER — GADOBENATE DIMEGLUMINE 529 MG/ML IV SOLN
17.0000 mL | Freq: Once | INTRAVENOUS | Status: AC | PRN
  Administered 2021-08-25: 17 mL via INTRAVENOUS

## 2021-08-29 ENCOUNTER — Ambulatory Visit (INDEPENDENT_AMBULATORY_CARE_PROVIDER_SITE_OTHER): Payer: Medicare Other

## 2021-08-29 VITALS — Ht 66.0 in | Wt 181.0 lb

## 2021-08-29 DIAGNOSIS — Z0000 Encounter for general adult medical examination without abnormal findings: Secondary | ICD-10-CM | POA: Diagnosis not present

## 2021-08-29 NOTE — Patient Instructions (Signed)
Marland KitchenMr. John Wagner , Thank you for taking time to complete your Medicare Wellness Visit. I appreciate your ongoing commitment to your health goals. Please review the following plan we discussed and let me know if I can assist you in the future.   Screening recommendations/referrals: Colonoscopy: Cologuard completed 08/24/2020-Due 08/24/2023 Recommended yearly ophthalmology/optometry visit for glaucoma screening and checkup Recommended yearly dental visit for hygiene and checkup  Vaccinations: Influenza vaccine: Due-May obtain vaccine at our office or your local pharmacy. Pneumococcal vaccine: Up to date Tdap vaccine: Up to date-Due-02/07/2031 Shingles vaccine: Discuss with pharmacy   Covid-19: Booster due-May obtain vaccine at your local pharmacy.  Advanced directives: Please bring a copy for your chart if available.  Conditions/risks identified: See problem list  Next appointment: Follow up in one year for your annual wellness visit.   Preventive Care 30 Years and Older, Male Preventive care refers to lifestyle choices and visits with your health care provider that can promote health and wellness. What does preventive care include? A yearly physical exam. This is also called an annual well check. Dental exams once or twice a year. Routine eye exams. Ask your health care provider how often you should have your eyes checked. Personal lifestyle choices, including: Daily care of your teeth and gums. Regular physical activity. Eating a healthy diet. Avoiding tobacco and drug use. Limiting alcohol use. Practicing safe sex. Taking low doses of aspirin every day. Taking vitamin and mineral supplements as recommended by your health care provider. What happens during an annual well check? The services and screenings done by your health care provider during your annual well check will depend on your age, overall health, lifestyle risk factors, and family history of disease. Counseling  Your health  care provider may ask you questions about your: Alcohol use. Tobacco use. Drug use. Emotional well-being. Home and relationship well-being. Sexual activity. Eating habits. History of falls. Memory and ability to understand (cognition). Work and work Statistician. Screening  You may have the following tests or measurements: Height, weight, and BMI. Blood pressure. Lipid and cholesterol levels. These may be checked every 5 years, or more frequently if you are over 23 years old. Skin check. Lung cancer screening. You may have this screening every year starting at age 68 if you have a 30-pack-year history of smoking and currently smoke or have quit within the past 15 years. Fecal occult blood test (FOBT) of the stool. You may have this test every year starting at age 68. Flexible sigmoidoscopy or colonoscopy. You may have a sigmoidoscopy every 5 years or a colonoscopy every 10 years starting at age 68. Prostate cancer screening. Recommendations will vary depending on your family history and other risks. Hepatitis C blood test. Hepatitis B blood test. Sexually transmitted disease (STD) testing. Diabetes screening. This is done by checking your blood sugar (glucose) after you have not eaten for a while (fasting). You may have this done every 1-3 years. Abdominal aortic aneurysm (AAA) screening. You may need this if you are a current or former smoker. Osteoporosis. You may be screened starting at age 68 if you are at high risk. Talk with your health care provider about your test results, treatment options, and if necessary, the need for more tests. Vaccines  Your health care provider may recommend certain vaccines, such as: Influenza vaccine. This is recommended every year. Tetanus, diphtheria, and acellular pertussis (Tdap, Td) vaccine. You may need a Td booster every 10 years. Zoster vaccine. You may need this after age 68. Pneumococcal  13-valent conjugate (PCV13) vaccine. One dose is  recommended after age 68. Pneumococcal polysaccharide (PPSV23) vaccine. One dose is recommended after age 68. Talk to your health care provider about which screenings and vaccines you need and how often you need them. This information is not intended to replace advice given to you by your health care provider. Make sure you discuss any questions you have with your health care provider. Document Released: 01/04/2016 Document Revised: 08/27/2016 Document Reviewed: 10/09/2015 Elsevier Interactive Patient Education  2017 [place] Prevention in the Home Falls can cause injuries. They can happen to people of all ages. There are many things you can do to make your home safe and to help prevent falls. What can I do on the outside of my home? Regularly fix the edges of walkways and driveways and fix any cracks. Remove anything that might make you trip as you walk through a door, such as a raised step or threshold. Trim any bushes or trees on the path to your home. Use bright outdoor lighting. Clear any walking paths of anything that might make someone trip, such as rocks or tools. Regularly check to see if handrails are loose or broken. Make sure that both sides of any steps have handrails. Any raised decks and porches should have guardrails on the edges. Have any leaves, snow, or ice cleared regularly. Use sand or salt on walking paths during winter. Clean up any spills in your garage right away. This includes oil or grease spills. What can I do in the bathroom? Use night lights. Install grab bars by the toilet and in the tub and shower. Do not use towel bars as grab bars. Use non-skid mats or decals in the tub or shower. If you need to sit down in the shower, use a plastic, non-slip stool. Keep the floor dry. Clean up any water that spills on the floor as soon as it happens. Remove soap buildup in the tub or shower regularly. Attach bath mats securely with double-sided non-slip rug  tape. Do not have throw rugs and other things on the floor that can make you trip. What can I do in the bedroom? Use night lights. Make sure that you have a light by your bed that is easy to reach. Do not use any sheets or blankets that are too big for your bed. They should not hang down onto the floor. Have a firm chair that has side arms. You can use this for support while you get dressed. Do not have throw rugs and other things on the floor that can make you trip. What can I do in the kitchen? Clean up any spills right away. Avoid walking on wet floors. Keep items that you use a lot in easy-to-reach places. If you need to reach something above you, use a strong step stool that has a grab bar. Keep electrical cords out of the way. Do not use floor polish or wax that makes floors slippery. If you must use wax, use non-skid floor wax. Do not have throw rugs and other things on the floor that can make you trip. What can I do with my stairs? Do not leave any items on the stairs. Make sure that there are handrails on both sides of the stairs and use them. Fix handrails that are broken or loose. Make sure that handrails are as long as the stairways. Check any carpeting to make sure that it is firmly attached to the stairs. Fix any carpet  that is loose or worn. Avoid having throw rugs at the top or bottom of the stairs. If you do have throw rugs, attach them to the floor with carpet tape. Make sure that you have a light switch at the top of the stairs and the bottom of the stairs. If you do not have them, ask someone to add them for you. What else can I do to help prevent falls? Wear shoes that: Do not have high heels. Have rubber bottoms. Are comfortable and fit you well. Are closed at the toe. Do not wear sandals. If you use a stepladder: Make sure that it is fully opened. Do not climb a closed stepladder. Make sure that both sides of the stepladder are locked into place. Ask someone to  hold it for you, if possible. Clearly mark and make sure that you can see: Any grab bars or handrails. First and last steps. Where the edge of each step is. Use tools that help you move around (mobility aids) if they are needed. These include: Canes. Walkers. Scooters. Crutches. Turn on the lights when you go into a dark area. Replace any light bulbs as soon as they burn out. Set up your furniture so you have a clear path. Avoid moving your furniture around. If any of your floors are uneven, fix them. If there are any pets around you, be aware of where they are. Review your medicines with your doctor. Some medicines can make you feel dizzy. This can increase your chance of falling. Ask your doctor what other things that you can do to help prevent falls. This information is not intended to replace advice given to you by your health care provider. Make sure you discuss any questions you have with your health care provider. Document Released: 10/04/2009 Document Revised: 05/15/2016 Document Reviewed: 01/12/2015 Elsevier Interactive Patient Education  2017 [employer].

## 2021-08-29 NOTE — Progress Notes (Signed)
Subjective:   John Wagner. is a 70 y.o. male who presents for an Initial Medicare Annual Wellness Visit.  I connected with Asser today by telephone and verified that I am speaking with the correct person using two identifiers. Location patient: home Location provider: work Persons participating in the virtual visit: patient, Marine scientist.    I discussed the limitations, risks, security and privacy concerns of performing an evaluation and management service by telephone and the availability of in person appointments. I also discussed with the patient that there may be a patient responsible charge related to this service. The patient expressed understanding and verbally consented to this telephonic visit.    Interactive audio and video telecommunications were attempted between this provider and patient, however failed, due to patient having technical difficulties OR patient did not have access to video capability.  We continued and completed visit with audio only.  Some vital signs may be absent or patient reported.   Time Spent with patient on telephone encounter: 20 minutes   Review of Systems     Cardiac Risk Factors include: male gender;advanced age (>26mn, >>74women);dyslipidemia;hypertension     Objective:    Today's Vitals   08/29/21 0910  Weight: 181 lb (82.1 kg)  Height: '5\' 6"'$  (1.676 m)   Body mass index is 29.21 kg/m.  Advanced Directives 08/29/2021  Does Patient Have a Medical Advance Directive? Yes  Type of Advance Directive Living will    Current Medications (verified) Outpatient Encounter Medications as of 08/29/2021  Medication Sig   acyclovir (ZOVIRAX) 400 MG tablet TAKE 1 TABLET BY MOUTH THREE TIMES DAILY AS NEEDED FOR OUTBREAKS   ALPRAZolam (XANAX) 1 MG tablet TAKE 1/2 TABLET BY MOUTH AT NOON AND 1/2 TABLET BY MOUTH AT BEDTIME. MAY TAKE A WHOLE TABLET IF NEEDED ON OCCASION *THIS MUST LAST AT LEAST 30 DAYS*   betamethasone valerate lotion (VALISONE) 0.1 % Apply  1 application topically 2 (two) times daily. Reported on 01/23/2016   Evolocumab (REPATHA SURECLICK) [id number]/ML SOAJ Inject 140 mg into the skin every 14 (fourteen) days.   hydrOXYzine (ATARAX/VISTARIL) 25 MG tablet Take 1 tablet (25 mg total) by mouth every 8 (eight) hours as needed for itching.   lisinopril (ZESTRIL) 5 MG tablet Take 1 tablet (5 mg total) by mouth daily.   propranolol (INDERAL) 10 MG tablet Use twice a day as needed for performance anxiety   tadalafil (CIALIS) 20 MG tablet Take 1 tablet (20 mg total) by mouth daily as needed for erectile dysfunction.   traMADol (ULTRAM) 50 MG tablet TAKE ONE TABLET BY MOUTH EVERY 8 HOURS AS NEEDED   No facility-administered encounter medications on file as of 08/29/2021.    Allergies (verified) Buspar [buspirone] and Statins   History: Past Medical History:  Diagnosis Date   Anxiety    Elevated PSA    Seeing Dr. NPara March  Hyperlipidemia    Hypertension    Psoriasis    Past Surgical History:  Procedure Laterality Date   APPENDECTOMY     Family History  Problem Relation Age of Onset   Cancer Father        prostate cancer   Hypertension Mother    Heart disease Mother    Social History   Socioeconomic History   Marital status: Single    Spouse name: Not on file   Number of children: Not on file   Years of education: Not on file   Highest education level: Not on file  Occupational  History   Occupation: picture Statistician: [employer]  Tobacco Use   Smoking status: Former   Smokeless tobacco: Never  Substance and Sexual Activity   Alcohol use: Yes   Drug use: No   Sexual activity: Yes  Other Topics Concern   Not on file  Social History Narrative   Not on file   Social Determinants of Health   Financial Resource Strain: Low Risk    Difficulty of Paying Living Expenses: Not hard at all  Food Insecurity: No Food Insecurity   Worried About Charity fundraiser in the Last Year: Never true   [place] in the Last Year: Never true  Transportation Needs: No Transportation Needs   Lack of Transportation (Medical): No   Lack of Transportation (Non-Medical): No  Physical Activity: Inactive   Days of Exercise per Week: 0 days   Minutes of Exercise per Session: 0 min  Stress: No Stress Concern Present   Feeling of Stress : Not at all  Social Connections: Not on file    Tobacco Counseling Counseling given: Not Answered   Clinical Intake:  Pre-visit preparation completed: Yes  Pain : No/denies pain     BMI - recorded: 29.21 Nutritional Status: BMI 25 -29 Overweight Nutritional Risks: None Diabetes: No  How often do you need to have someone help you when you read instructions, pamphlets, or other written materials from your doctor or pharmacy?: 1 - Never  Diabetic?No  Interpreter Needed?: No  Information entered by :: Caroleen Hamman LPN   Activities of Daily Living In your present state of health, do you have any difficulty performing the following activities: 08/29/2021 03/08/2021  Hearing? N N  Vision? N N  Difficulty concentrating or making decisions? N N  Walking or climbing stairs? N N  Dressing or bathing? N N  Doing errands, shopping? N N  Preparing Food and eating ? N -  Using the Toilet? N -  In the past six months, have you accidently leaked urine? N -  Do you have problems with loss of bowel control? N -  Managing your Medications? N -  Managing your Finances? N -  Housekeeping or managing your Housekeeping? N -  Some recent data might be hidden    Patient Care Team: Copland, [name], MD as PCP - General (Family Medicine) Berniece Salines, DO as PCP - Cardiology (Cardiology) Kathie Rhodes, MD (Inactive) as Consulting Physician (Urology)  Indicate any recent Medical Services you may have received from other than [facility] providers in the past year (date may be approximate).     Assessment:   This is a routine wellness examination for  John Wagner.  Hearing/Vision screen Hearing Screening - Comments:: C/o mild hearing loss with high frequency Vision Screening - Comments:: Last eye exam-within the last year-Dr. Gershon Crane  Dietary issues and exercise activities discussed: Current Exercise Habits: The patient does not participate in regular exercise at present, Exercise limited by: None identified   Goals Addressed             This Visit's Progress    Patient Stated       Would like to lose some weight       Depression Screen PHQ 2/9 Scores 08/29/2021 07/10/2021 03/08/2021 08/06/2020 08/04/2018 07/29/2017 07/02/2016  PHQ - 2 Score 1 0 0 0 0 0 0  PHQ- 9 Score - 0 0 - - - -    Fall Risk Fall Risk  08/29/2021 07/10/2021  08/06/2020 07/02/2016 01/23/2016  Falls in the past year? 0 0 0 No No  Number falls in past yr: 0 0 - - -  Injury with Fall? 0 0 - - -  Follow up Falls prevention discussed - - - -    FALL RISK PREVENTION PERTAINING TO THE HOME:  Any stairs in or around the home? Yes  If so, are there any without handrails? No  Home free of loose throw rugs in walkways, pet beds, electrical cords, etc? Yes  Adequate lighting in your home to reduce risk of falls? Yes   ASSISTIVE DEVICES UTILIZED TO PREVENT FALLS:  Life alert? No  Use of a cane, walker or w/c? No  Grab bars in the bathroom? Yes  Shower chair or bench in shower? No  Elevated toilet seat or a handicapped toilet? No   TIMED UP AND GO:  Was the test performed? No . Phone visit   Cognitive Function:Normal cognitive status assessed by  this Nurse Health Advisor. No abnormalities found.          Immunizations Immunization History  Administered Date(s) Administered   Fluad Quad(high Dose 65+) 02/07/2021   Hepatitis B 08/04/1999, 09/04/1999, 02/20/2000   Influenza, High Dose Seasonal PF 02/09/2019   Influenza,inj,Quad PF,6+ Mos 01/26/2017, 02/03/2018, 01/30/2020   PFIZER(Purple Top)SARS-COV-2 Vaccination 02/17/2020, 03/13/2020, 12/18/2020   Pneumococcal  Conjugate-13 08/04/2018   Pneumococcal Polysaccharide-23 01/30/2020   Td 02/07/2021   Tdap 06/24/2010    TDAP status: Up to date  Flu Vaccine status: Due, Education has been provided regarding the importance of this vaccine. Advised may receive this vaccine at local pharmacy or Health Dept. Aware to provide a copy of the vaccination record if obtained from local pharmacy or Health Dept. Verbalized acceptance and understanding.  Pneumococcal vaccine status: Up to date  Covid-19 vaccine status: Information provided on how to obtain vaccines.  Booster due  Qualifies for Shingles Vaccine? Yes   Zostavax completed No   Shingrix Completed?: No.    Education has been provided regarding the importance of this vaccine. Patient has been advised to call insurance company to determine out of pocket expense if they have not yet received this vaccine. Advised may also receive vaccine at local pharmacy or Health Dept. Verbalized acceptance and understanding.  Screening Tests Health Maintenance  Topic Date Due   Zoster Vaccines- Shingrix (1 of 2) Never done   COVID-19 Vaccine (4 - Booster for Pfizer series) 04/18/2021   INFLUENZA VACCINE  07/22/2021   Fecal DNA (Cologuard)  08/25/2023   TETANUS/TDAP  02/07/2031   Hepatitis C Screening  Completed   PNA vac Low Risk Adult  Completed   HPV VACCINES  Aged Out    Health Maintenance  Health Maintenance Due  Topic Date Due   Zoster Vaccines- Shingrix (1 of 2) Never done   COVID-19 Vaccine (4 - Booster for Pfizer series) 04/18/2021   INFLUENZA VACCINE  07/22/2021    Colorectal cancer screening: Type of screening: Cologuard. Completed 08/24/2020. Repeat every 3 years  Lung Cancer Screening: (Low Dose CT Chest recommended if Age 88-80 years, 30 pack-year currently smoking OR have quit w/in 15years.) does not qualify.     Additional Screening:  Hepatitis C Screening: Completed 12/25/2014  Vision Screening: Recommended annual ophthalmology exams  for early detection of glaucoma and other disorders of the eye. Is the patient up to date with their annual eye exam?  Yes  Who is the provider or what is the name of the office in  which the patient attends annual eye exams? Dr. Gershon Crane   Dental Screening: Recommended annual dental exams for proper oral hygiene  Community Resource Referral / Chronic Care Management: CRR required this visit?  No   CCM required this visit?  No      Plan:     I have personally reviewed and noted the following in the patient's chart:   Medical and social history Use of alcohol, tobacco or illicit drugs  Current medications and supplements including opioid prescriptions. Patient is not currently taking opioid prescriptions. Functional ability and status Nutritional status Physical activity Advanced directives List of other physicians Hospitalizations, surgeries, and ER visits in previous 12 months Vitals Screenings to include cognitive, depression, and falls Referrals and appointments  In addition, I have reviewed and discussed with patient certain preventive protocols, quality metrics, and best practice recommendations. A written personalized care plan for preventive services as well as general preventive health recommendations were provided to patient.   Due to this being a telephonic visit, the after visit summary with patients personalized plan was offered to patient via mail or my-chart. Patient would like to access on my-chart.   Marta Antu, LPN   [id number]  Nurse Health Advisor  Nurse Notes: None

## 2021-09-09 DIAGNOSIS — L400 Psoriasis vulgaris: Secondary | ICD-10-CM | POA: Diagnosis not present

## 2021-09-09 DIAGNOSIS — L820 Inflamed seborrheic keratosis: Secondary | ICD-10-CM | POA: Diagnosis not present

## 2021-09-09 DIAGNOSIS — L814 Other melanin hyperpigmentation: Secondary | ICD-10-CM | POA: Diagnosis not present

## 2021-09-09 DIAGNOSIS — L570 Actinic keratosis: Secondary | ICD-10-CM | POA: Diagnosis not present

## 2021-09-18 DIAGNOSIS — R972 Elevated prostate specific antigen [PSA]: Secondary | ICD-10-CM | POA: Diagnosis not present

## 2021-09-18 DIAGNOSIS — N400 Enlarged prostate without lower urinary tract symptoms: Secondary | ICD-10-CM | POA: Diagnosis not present

## 2021-09-21 DIAGNOSIS — C61 Malignant neoplasm of prostate: Secondary | ICD-10-CM

## 2021-09-21 HISTORY — DX: Malignant neoplasm of prostate: C61

## 2021-10-11 DIAGNOSIS — R972 Elevated prostate specific antigen [PSA]: Secondary | ICD-10-CM | POA: Diagnosis not present

## 2021-10-11 DIAGNOSIS — D075 Carcinoma in situ of prostate: Secondary | ICD-10-CM | POA: Diagnosis not present

## 2021-10-11 DIAGNOSIS — C61 Malignant neoplasm of prostate: Secondary | ICD-10-CM | POA: Diagnosis not present

## 2021-10-18 DIAGNOSIS — C61 Malignant neoplasm of prostate: Secondary | ICD-10-CM | POA: Diagnosis not present

## 2021-10-24 NOTE — Progress Notes (Signed)
GU Location of Tumor / Histology: Prostate Ca  If Prostate Cancer, Gleason Score is (4 + 3) and PSA is (12.8 as of 07/29/2021)  Biopsies  Dr. Milford Cage         Past/Anticipated interventions by urology, if any:   Past/Anticipated interventions by medical oncology, if any:   Weight changes, if any: Stable  IPSS:  6 SHIM:  23  Bowel/Bladder complaints, if any:  No  Nausea/Vomiting, if any: No  Pain issues, if any:  0  SAFETY ISSUES: Prior radiation? No Pacemaker/ICD? No Possible current pregnancy?  Male Is the patient on methotrexate?  No  Current Complaints / other details:  More information on treatments.

## 2021-10-25 ENCOUNTER — Encounter: Payer: Self-pay | Admitting: Radiation Oncology

## 2021-10-25 ENCOUNTER — Ambulatory Visit
Admission: RE | Admit: 2021-10-25 | Discharge: 2021-10-25 | Disposition: A | Discharge status: Home / Self Care | Payer: Medicare Other | Source: Ambulatory Visit | Attending: Radiation Oncology | Admitting: Radiation Oncology

## 2021-10-25 ENCOUNTER — Other Ambulatory Visit: Payer: Self-pay

## 2021-10-25 DIAGNOSIS — C61 Malignant neoplasm of prostate: Secondary | ICD-10-CM | POA: Insufficient documentation

## 2021-10-25 NOTE — Progress Notes (Signed)
Radiation Oncology         [phone]) [phone] ________________________________  Initial Outpatient Consultation  Name: Elaine Middleton. MRN: [id number]  Date: 10/25/2021  DOB: May 09, 1951  KP:VVZSMOL, [name], MD  [name]., MD  REFERRING PHYSICIAN: [name]., MD  DIAGNOSIS: 70 y.o. gentleman with Stage T1c adenocarcinoma of the prostate with Gleason score of 3+4, and PSA of 12.8.    ICD-10-CM   1. Malignant neoplasm of prostate [facility])  C61       HISTORY OF PRESENT ILLNESS: Yishai Rehfeld. is a 70 y.o. male with a diagnosis of prostate cancer.  He is an established urology patient, previously followed by Dr. Karsten Ro with a longstanding history of BPH and elevated PSA.  He has had 2 prior negative prostate biopsies in 2001 and 2008 with a PSA around 10 at the time of biopsy.  There is a family history of prostate cancer in his father.  Over the last couple of years, his PSAs have fluctuated but have remained stable between 7 and 8 for the most part.  More recently, he has been followed with Dr. Milford Cage since at least 2021 and digital rectal examinations have remained stable without concerning findings or discrete nodularity.  His most recent PSA from 07/29/2021 was noted to be further elevated at 12.8 which prompted a prostate MRI for further evaluation.  Prostate MRI was performed on 08/27/2021 and showed 3 separate PI-RADS 3 lesions with one in the left peripheral zone, 1 in the right peripheral zone and another in the right peripheral zone at the mid gland to apex.  There was no lymphadenopathy or evidence of metastatic disease in the pelvis. The patient proceeded to an MRI fusion transrectal ultrasound with 17 biopsies of the prostate on 10/11/2021.  The prostate volume measured 51.97 cc.  Out of 17 core biopsies, 3 were positive.  The maximum Gleason score was 3+4, and this was seen in the sample taken from the MRI ROI lesion #3.  Additionally, Gleason 3+3 was noted in the right mid and  right apex.  The patient reviewed the biopsy results with his urologist and he has kindly been referred today for discussion of potential radiation treatment options.   PREVIOUS RADIATION THERAPY: No  PAST MEDICAL HISTORY:  Past Medical History:  Diagnosis Date   Anxiety    Elevated PSA    Seeing Dr. Para March   Hyperlipidemia    Hypertension    Psoriasis       PAST SURGICAL HISTORY: Past Surgical History:  Procedure Laterality Date   APPENDECTOMY      FAMILY HISTORY:  Family History  Problem Relation Age of Onset   Cancer Father        prostate cancer   Hypertension Mother    Heart disease Mother     SOCIAL HISTORY:  Social History   Socioeconomic History   Marital status: Single    Spouse name: Not on file   Number of children: Not on file   Years of education: Not on file   Highest education level: Not on file  Occupational History   Occupation: picture frame/artist    Employer: [employer]  Tobacco Use   Smoking status: Former   Smokeless tobacco: Never  Substance and Sexual Activity   Alcohol use: Yes   Drug use: No   Sexual activity: Yes  Other Topics Concern   Not on file  Social History Narrative   Not on file   Social Determinants of Health  Financial Resource Strain: Low Risk    Difficulty of Paying Living Expenses: Not hard at all  Food Insecurity: No Food Insecurity   Worried About Charity fundraiser in the Last Year: Never true   Ran Out of Food in the Last Year: Never true  Transportation Needs: No Transportation Needs   Lack of Transportation (Medical): No   Lack of Transportation (Non-Medical): No  Physical Activity: Inactive   Days of Exercise per Week: 0 days   Minutes of Exercise per Session: 0 min  Stress: No Stress Concern Present   Feeling of Stress : Not at all  Social Connections: Not on file  Intimate Partner Violence: Not At Risk   Fear of Current or Ex-Partner: No   Emotionally Abused: No   Physically Abused: No    Sexually Abused: No    ALLERGIES: Buspar [buspirone] and Statins  MEDICATIONS:  Current Outpatient Medications  Medication Sig Dispense Refill   acyclovir (ZOVIRAX) 400 MG tablet TAKE 1 TABLET BY MOUTH THREE TIMES DAILY AS NEEDED FOR OUTBREAKS 45 tablet 3   ALPRAZolam (XANAX) 1 MG tablet TAKE 1/2 TABLET BY MOUTH AT NOON AND TAKE 1/2 TABLET BY MOUTH EVERY NIGHT AT BEDTIME. MAY TAKE A WHOLE TABLET IF NEEDED ON OCCASION 60 tablet 2   betamethasone valerate lotion (VALISONE) 0.1 % Apply 1 application topically 2 (two) times daily. Reported on 01/23/2016     Evolocumab (REPATHA SURECLICK) 295 MG/ML SOAJ Inject 140 mg into the skin every 14 (fourteen) days. 2 mL 11   hydrOXYzine (ATARAX/VISTARIL) 25 MG tablet Take 1 tablet (25 mg total) by mouth every 8 (eight) hours as needed for itching. 21 tablet 0   lisinopril (ZESTRIL) 5 MG tablet Take 1 tablet (5 mg total) by mouth daily. 90 tablet 3   propranolol (INDERAL) 10 MG tablet Use twice a day as needed for performance anxiety 30 tablet 3   tadalafil (CIALIS) 20 MG tablet Take 1 tablet (20 mg total) by mouth daily as needed for erectile dysfunction. 30 tablet 2   traMADol (ULTRAM) 50 MG tablet TAKE ONE TABLET BY MOUTH EVERY 8 HOURS AS NEEDED 30 tablet 3   No current facility-administered medications for this encounter.    REVIEW OF SYSTEMS:  On review of systems, the patient reports that he is doing well overall. He denies any chest pain, shortness of breath, cough, fevers, chills, night sweats, unintended weight changes. He denies any bowel disturbances, and denies abdominal pain, nausea or vomiting. He denies any new musculoskeletal or joint aches or pains. His IPSS was 6, indicating mild urinary symptoms. His SHIM was 23, indicating he does not have erectile dysfunction. A complete review of systems is obtained and is otherwise negative.   PHYSICAL EXAM:  Wt Readings from Last 3 Encounters:  10/25/21 174 lb (78.9 kg)  08/29/21 181 lb (82.1 kg)   08/08/21 181 lb 1.3 oz (82.1 kg)   Temp Readings from Last 3 Encounters:  10/25/21 98.4 F (36.9 C) (Oral)  07/10/21 98 F (36.7 C) (Oral)  03/08/21 98.3 F (36.8 C)   BP Readings from Last 3 Encounters:  10/25/21 (!) 120/91  07/10/21 124/62  03/08/21 122/86   Pulse Readings from Last 3 Encounters:  10/25/21 78  07/10/21 74  03/08/21 65   Pain Assessment Pain Score: 0-No pain/10  In general this is a well appearing Caucasian male in no acute distress. He's alert and oriented x4 and appropriate throughout the examination. Cardiopulmonary assessment is negative for acute  distress, and he exhibits normal effort.     KPS = 100  100 - Normal; no complaints; no evidence of disease. 90   - Able to carry on normal activity; minor signs or symptoms of disease. 80   - Normal activity with effort; some signs or symptoms of disease. 36   - Cares for self; unable to carry on normal activity or to do active work. 60   - Requires occasional assistance, but is able to care for most of his personal needs. 50   - Requires considerable assistance and frequent medical care. 29   - Disabled; requires special care and assistance. 59   - Severely disabled; hospital admission is indicated although death not imminent. 2   - Very sick; hospital admission necessary; active supportive treatment necessary. 10   - Moribund; fatal processes progressing rapidly. 0     - Dead  Karnofsky DA, Abelmann [place], Craver LS and Burchenal [facility] [phone]) The use of the nitrogen mustards in the palliative treatment of carcinoma: with particular reference to bronchogenic carcinoma Cancer 1 634-56  LABORATORY DATA:  Lab Results  Component Value Date   WBC 5.6 07/10/2021   HGB 14.3 07/10/2021   HCT 43.1 07/10/2021   MCV 88.3 07/10/2021   PLT 354.0 07/10/2021   Lab Results  Component Value Date   NA 139 07/10/2021   K 4.7 07/10/2021   CL 104 07/10/2021   CO2 27 07/10/2021   Lab Results  Component Value Date    ALT 18 07/10/2021   AST 16 07/10/2021   ALKPHOS 42 07/10/2021   BILITOT 0.6 07/10/2021     RADIOGRAPHY: No results found.    IMPRESSION/PLAN: 1. 70 y.o. gentleman with Stage T1c adenocarcinoma of the prostate with Gleason Score of 3+ 4, and PSA of 12.8. We discussed the patient's workup and outlined the nature of prostate cancer in this setting. The patient's T stage, Gleason's score, and PSA put him into the favorable intermediate risk group. Accordingly, he is eligible for a variety of potential treatment options including brachytherapy, 5.5 weeks of external radiation, or prostatectomy. We discussed the available radiation techniques, and focused on the details and logistics of delivery. We discussed and outlined the risks, benefits, short and long-term effects associated with radiotherapy and compared and contrasted these with prostatectomy. We discussed the role of SpaceOAR gel in reducing the rectal toxicity associated with radiotherapy. He appears to have a good understanding of his disease and our treatment recommendations which are of curative intent.  He was encouraged to ask questions that were answered to his stated satisfaction.  At the conclusion of our conversation, the patient is interested in moving forward with brachytherapy and use of SpaceOAR gel to reduce rectal toxicity from radiotherapy.  We will share our discussion with Dr. Milford Cage and move forward with scheduling his [facility] planning appointment in the near future.  The patient met briefly with Romie Jumper in our office who will be working closely with him to coordinate OR scheduling and pre and post procedure appointments.  We will contact the pharmaceutical rep to ensure that Gratz is available at the time of procedure.  We enjoyed meeting him today and look forward to continuing to participate in his care.  We personally spent 70 minutes in this encounter including chart review, reviewing radiological studies, meeting  face-to-face with the patient, entering orders and completing documentation.    Nicholos Johns, PA-C    Tyler Pita, MD  [place]  Radiation Oncology Direct Dial: [phone]  Fax: [phone] [email or web address]  Skype  LinkedIn

## 2021-10-25 NOTE — Progress Notes (Signed)
Introduced myself to patient as the prostate nurse navigator and discussed my role.  No barriers to care identified at this time.  He is here to discuss his radiation treatment options with Dr. Tammi Klippel and is very interested in brachytherapy.  I gave him my business card and asked him to call me with questions or concerns.  Verbalized understanding.

## 2021-10-27 ENCOUNTER — Other Ambulatory Visit: Payer: Self-pay | Admitting: Family Medicine

## 2021-10-27 DIAGNOSIS — F411 Generalized anxiety disorder: Secondary | ICD-10-CM

## 2021-10-28 NOTE — Telephone Encounter (Signed)
Requesting: Xanax Contract: UDS: Last OV: 07/10/21 Next OV: 01/13/2022 Last Refill: 06/21/2021, #60--1 RF Database:   Please advise

## 2021-10-30 ENCOUNTER — Telehealth: Payer: Self-pay | Admitting: *Deleted

## 2021-10-30 NOTE — Telephone Encounter (Signed)
CALLED PATIENT TO INFORM OF PRE-SEED APPTS. FOR 11-07-21, SPOKE WITH PATIENT AND HE IS AWARE OF THESE APPTS.

## 2021-10-31 ENCOUNTER — Telehealth: Payer: Self-pay | Admitting: *Deleted

## 2021-10-31 NOTE — Telephone Encounter (Signed)
Called patient to inform of implant date, lvm for a return calll

## 2021-11-04 ENCOUNTER — Other Ambulatory Visit: Payer: Self-pay | Admitting: Urology

## 2021-11-06 ENCOUNTER — Telehealth: Payer: Self-pay | Admitting: *Deleted

## 2021-11-06 NOTE — Progress Notes (Signed)
  Radiation Oncology         [phone]) [phone] ________________________________  Name: John Wagner. MRN: [id number]  Date: 11/07/2021  DOB: [birth year]  SIMULATION AND TREATMENT PLANNING NOTE PUBIC ARCH STUDY  LH:TDSKAJG, [name], MD  Copland, [name], MD  DIAGNOSIS: 70 y.o. gentleman with Stage T1c adenocarcinoma of the prostate with Gleason score of 3+4, and PSA of 12.8.  Oncology History  Malignant neoplasm of prostate ([place])  10/11/2021 Cancer Staging   Staging form: Prostate, AJCC 8th Edition - Clinical stage from 10/11/2021: Stage IIB (cT1c, cN0, cM0, PSA: 12.8, Grade Group: 2) - Signed by [name], PA-C on 10/25/2021 Histopathologic type: Adenocarcinoma, NOS Stage prefix: Initial diagnosis Prostate specific antigen (PSA) range: 10 to 19 Gleason primary pattern: 3 Gleason secondary pattern: 4 Gleason score: 7 Histologic grading system: 5 grade system Number of biopsy cores examined: 17 Number of biopsy cores positive: 3 Location of positive needle core biopsies: One side    10/25/2021 Initial Diagnosis   Malignant neoplasm of prostate ([place])       ICD-10-CM   1. Malignant neoplasm of prostate ([place])  C61       COMPLEX SIMULATION:  The patient presented today for evaluation for possible prostate seed implant. He was brought to the radiation planning suite and placed supine on the CT couch. A 3-dimensional image study set was obtained in upload to the planning computer. There, on each axial slice, I contoured the prostate gland. Then, using three-dimensional radiation planning tools I reconstructed the prostate in view of the structures from the transperineal needle pathway to assess for possible pubic arch interference. In doing so, I did not appreciate any pubic arch interference. Also, the patient's prostate volume was estimated based on the drawn structure. The volume was 51 cc.  Given the pubic arch appearance and prostate volume, patient remains a good candidate  to proceed with prostate seed implant. Today, he freely provided informed written consent to proceed.    PLAN: The patient will undergo prostate seed implant.   ________________________________  Sheral Apley. Tammi Klippel, M.D.

## 2021-11-06 NOTE — Telephone Encounter (Signed)
CALLED PATIENT TO REMIND OF PRE-SEED APPTS. FOR 11-07-21, SPOKE WITH PATIENT AND HE IS AWARE OF THESE APPTS.[name]

## 2021-11-07 ENCOUNTER — Ambulatory Visit (HOSPITAL_COMMUNITY)
Admission: RE | Admit: 2021-11-07 | Discharge: 2021-11-07 | Disposition: A | Discharge status: Home / Self Care | Payer: Medicare Other | Source: Ambulatory Visit | Attending: Urology | Admitting: Urology

## 2021-11-07 ENCOUNTER — Ambulatory Visit
Admission: RE | Admit: 2021-11-07 | Discharge: 2021-11-07 | Disposition: A | Discharge status: Home / Self Care | Payer: Medicare Other | Source: Ambulatory Visit | Attending: Radiation Oncology | Admitting: Radiation Oncology

## 2021-11-07 ENCOUNTER — Encounter (HOSPITAL_COMMUNITY)
Admission: RE | Admit: 2021-11-07 | Discharge: 2021-11-07 | Disposition: A | Discharge status: Home / Self Care | Payer: Medicare Other | Source: Ambulatory Visit | Attending: Urology | Admitting: Urology

## 2021-11-07 ENCOUNTER — Ambulatory Visit
Admission: RE | Admit: 2021-11-07 | Discharge: 2021-11-07 | Disposition: A | Discharge status: Home / Self Care | Payer: Medicare Other | Source: Ambulatory Visit | Attending: Urology | Admitting: Urology

## 2021-11-07 ENCOUNTER — Encounter: Payer: Self-pay | Admitting: Urology

## 2021-11-07 ENCOUNTER — Other Ambulatory Visit: Payer: Self-pay

## 2021-11-07 DIAGNOSIS — Z01818 Encounter for other preprocedural examination: Secondary | ICD-10-CM | POA: Diagnosis not present

## 2021-11-07 DIAGNOSIS — C61 Malignant neoplasm of prostate: Secondary | ICD-10-CM

## 2021-11-07 NOTE — Progress Notes (Addendum)
Patient reports fatigue. No other symptoms reported at this time.  Meaningful use complete.  No current urinary management medications. No "post seed" follow-up scheduled at this time -per [facility].  There were no vitals taken for this visit.

## 2021-11-08 ENCOUNTER — Encounter: Payer: Self-pay | Admitting: *Deleted

## 2021-11-08 NOTE — Progress Notes (Signed)
[facility] CSW Psychosocial Distress Screening   Social Work was referred by distress screening protocol.  The patient scored an 8 on the Psychosocial Distress Thermometer which indicates severe distress. Social Work Theatre manager contacted patient by phone to assess for distress and other psychosocial needs.  SW Intern left a voicemail offering support and encouraging patient to return call.   ONCBCN DISTRESS SCREENING 11/07/2021  Distress experienced in past week (1-10) 8  Emotional problem type Nervousness/Anxiety;Adjusting to illness  Spiritual/Religous concerns type   Information Concerns Type   Physical Problem type     Rosary Lively, Social Work Intern Supervised by Gwinda Maine, LCSW

## 2021-11-19 ENCOUNTER — Encounter: Payer: Self-pay | Admitting: *Deleted

## 2021-11-19 NOTE — Progress Notes (Signed)
[name] Psychosocial Distress Screening    Social Work was referred by distress screening protocol.  The patient scored an 8 on the Psychosocial Distress Thermometer which indicates severe distress. Social Work Theatre manager contacted patient by phone (2nd attempt) to assess for distress and other psychosocial needs.  Pt was at work during call so SW Intern kept call short. Pt expressed he is feeling ok, and had questions regarding transportation to/ from his surgery on 12/23/21, such as whether or not he must have someone accompany him and be present at the hospital for the duration of his procedure. Intern encouraged pt to contact his doctor to verify, and made referral for transportation services per pt request.  Intern informed patient of the support team roles and support services at [facility]; Pt expressed interest in support group and other services, and intern provided phone number and email to register. Intern provided CSW contact information and encouraged patient to call with any questions or concerns.      ONCBCN DISTRESS SCREENING 11/07/2021  Distress experienced in past week (1-10) 8  Emotional problem type Nervousness/Anxiety;Adjusting to illness  Spiritual/Religous concerns type    Information Concerns Type    Physical Problem type        Rosary Lively, Social Work Intern Supervised by Gwinda Maine, LCSW

## 2021-12-17 ENCOUNTER — Encounter (HOSPITAL_BASED_OUTPATIENT_CLINIC_OR_DEPARTMENT_OTHER): Payer: Self-pay | Admitting: Urology

## 2021-12-17 ENCOUNTER — Other Ambulatory Visit: Payer: Self-pay

## 2021-12-17 NOTE — Progress Notes (Signed)
Spoke w/ via phone for pre-op interview--- Magda Paganini needs dos----   Dorene Sorrow, PT/INR labs in Epic.          Lab results------ COVID test -----patient states asymptomatic no test needed Arrive at -------1100 NPO after MN NO Solid Food.  Clear liquids from MN until---1000 Med rec completed Medications to take morning of surgery ----- Xanax PRN Diabetic medication ----- Patient instructed no nail polish to be worn day of surgery Patient instructed to bring photo id and insurance card day of surgery Patient aware to have Driver (ride ) / caregiver    for 24 hours after surgery  Patient Special Instructions ----- Pre-Op special Istructions ----- Patient verbalized understanding of instructions that were given at this phone interview. Patient denies shortness of breath, chest pain, fever, cough at this phone interview.

## 2021-12-19 ENCOUNTER — Encounter (HOSPITAL_COMMUNITY)
Admission: RE | Admit: 2021-12-19 | Discharge: 2021-12-19 | Disposition: A | Discharge status: Home / Self Care | Payer: Medicare Other | Source: Ambulatory Visit | Attending: Urology | Admitting: Urology

## 2021-12-19 ENCOUNTER — Other Ambulatory Visit: Payer: Self-pay

## 2021-12-19 DIAGNOSIS — Z01818 Encounter for other preprocedural examination: Secondary | ICD-10-CM | POA: Diagnosis not present

## 2021-12-19 DIAGNOSIS — Z01812 Encounter for preprocedural laboratory examination: Secondary | ICD-10-CM | POA: Insufficient documentation

## 2021-12-19 DIAGNOSIS — C61 Malignant neoplasm of prostate: Secondary | ICD-10-CM | POA: Diagnosis not present

## 2021-12-19 LAB — COMPREHENSIVE METABOLIC PANEL
ALT: 20 U/L (ref 0–44)
AST: 17 U/L (ref 15–41)
Albumin: 4 g/dL (ref 3.5–5.0)
Alkaline Phosphatase: 43 U/L (ref 38–126)
Anion gap: 5 (ref 5–15)
BUN: 16 mg/dL (ref 8–23)
CO2: 27 mmol/L (ref 22–32)
Calcium: 9.1 mg/dL (ref 8.9–10.3)
Chloride: 106 mmol/L (ref 98–111)
Creatinine, Ser: 0.85 mg/dL (ref 0.61–1.24)
GFR, Estimated: >60 mL/min (ref >60)
Glucose, Bld: 113 mg/dL — ABNORMAL HIGH (ref 70–99)
Potassium: 3.9 mmol/L (ref 3.5–5.1)
Sodium: 138 mmol/L (ref 135–145)
Total Bilirubin: 0.7 mg/dL (ref 0.3–1.2)
Total Protein: 6.7 g/dL (ref 6.5–8.1)

## 2021-12-19 LAB — CBC
HCT: 43.9 % (ref 39.0–52.0)
Hemoglobin: 14.4 g/dL (ref 13.0–17.0)
MCH: 28.7 pg (ref 26.0–34.0)
MCHC: 32.8 g/dL (ref 30.0–36.0)
MCV: 87.6 fL (ref 80.0–100.0)
Platelets: 378 10*3/uL (ref 150–400)
RBC: 5.01 MIL/uL (ref 4.22–5.81)
RDW: 13.3 % (ref 11.5–15.5)
WBC: 5.5 10*3/uL (ref 4.0–10.5)
nRBC: 0 % (ref 0.0–0.2)

## 2021-12-19 LAB — APTT: aPTT: 25 seconds (ref 24–36)

## 2021-12-19 LAB — PROTIME-INR
INR: 1 (ref 0.8–1.2)
Prothrombin Time: 12.8 seconds (ref 11.4–15.2)

## 2021-12-20 ENCOUNTER — Telehealth: Payer: Self-pay | Admitting: *Deleted

## 2021-12-20 NOTE — Telephone Encounter (Signed)
CALLED PATIENT TO REMIND OF PROCEDURE FOR 12-24-21, SPOKE WITH PATIENT AND HE IS AWARE OF THIS PROCEDURE

## 2021-12-22 ENCOUNTER — Encounter (HOSPITAL_COMMUNITY): Payer: Self-pay | Admitting: Anesthesiology

## 2021-12-22 NOTE — Anesthesia Preprocedure Evaluation (Deleted)
Anesthesia Evaluation    Reviewed: Allergy & Precautions, Patient's Chart, lab work & pertinent test results  Airway        Dental   Pulmonary former smoker,           Cardiovascular hypertension, Pt. on medications + Peripheral Vascular Disease (3.9 cm Ascending Aorta aneurysm)    08/13/21 TTE 1. Left ventricular ejection fraction, by estimation, is 55 to 60%. The  left ventricle has normal function. The left ventricle has no regional  wall motion abnormalities. Left ventricular diastolic parameters are  consistent with Grade I diastolic  dysfunction (impaired relaxation).  2. Right ventricular systolic function is normal. The right ventricular  size is normal.  3. The mitral valve is normal in structure. Mild mitral valve  regurgitation. No evidence of mitral stenosis.  4. The aortic valve is normal in structure. Aortic valve regurgitation is  mild. No aortic stenosis is present.  5. Aneurysm of the ascending aorta, measuring 39 mm.  6. The inferior vena cava is normal in size with greater than 50%  respiratory variability, suggesting right atrial pressure of 3 mmHg.    Neuro/Psych    GI/Hepatic negative GI ROS, Neg liver ROS,   Endo/Other  negative endocrine ROS  Renal/GU Lab Results      Component                Value               Date                      CREATININE               0.85                12/19/2021                BUN                      16                  12/19/2021                NA                       138                 12/19/2021                K                        3.9                 12/19/2021                CL                       106                 12/19/2021                CO2                      27                  12/19/2021  Stage I adeno CA of the Prostate    Musculoskeletal negative musculoskeletal ROS (+)   Abdominal   Peds  Hematology Lab Results       Component                Value               Date                      WBC                      5.5                 12/19/2021                HGB                      14.4                12/19/2021                HCT                      43.9                12/19/2021                MCV                      87.6                12/19/2021                PLT                      378                 12/19/2021              Anesthesia Other Findings All: Buspar, Statins  Reproductive/Obstetrics                             Anesthesia Physical Anesthesia Plan  ASA: 3  Anesthesia Plan: General   Post-op Pain Management: Dilaudid IV   Induction: Intravenous  PONV Risk Score and Plan: Treatment may vary due to age or medical condition, Ondansetron, Dexamethasone and Midazolam  Airway Management Planned: Oral ETT  Additional Equipment: None  Intra-op Plan:   Post-operative Plan: Extubation in OR  Informed Consent:     Dental advisory given  Plan Discussed with: CRNA and Anesthesiologist  Anesthesia Plan Comments:         Anesthesia Quick Evaluation

## 2021-12-23 NOTE — H&P (Signed)
John Wagner is a 71 year-old male established patient who is here for follow up for further evaluation of his elevated PSA.   His last PSA was performed 05/01/2020. The last PSA value was 8.63.   He has had a prostate biopsy. Patient does not have a family history of prostate cancer. The patient states he does not take 5 alpha reductase inhibitor medication.   He has not recently had unwanted weight loss. He is not having new bone pain. The patient complains of lower urinary tract symptom(s) that include urgency.   Elevated PSA: He has a history of elevated PSA which had fallen with antibiotic therapy in the past.  TRUS/BX 1/01: Performed for a PSA elevation that did not fall significantly with antibiotics.  Pathology: BPH only.  TRUS/BX 01/29/07: PSA of 10.0 with a normal DRE.  Pathology: BPH and inflammation but no evidence of malignancy.   05/08/20: The patient returns today for follow-up of an elevated PSA. He also has known BPH and tells me that occasionally he will have a sensation of hesitancy and is also worried times that he is not emptying his bladder but he said he feels as though he empties his bladder completely. He otherwise is without urologic complaint.  -08/06/20 patient with history of PSA elevation running between 7 and 8 as above. Has had 2-biopsies in January 2001 and in February of 2008. Most recent PSA is 7.65 on 08/03/2020 which is down from previous value of 8.68 in May of 2021.   Main new GU issue is some acute onset of right-sided testicular pain which started after playing the bag pipes by his report. Noted some swelling and tenderness approximately 4 days ago. Was seen by PCP placed him on Bactrim DS and seems to be improving slightly since then. Current symptoms are moderate severity. Denies any fever or urinary symptomatology. Urinalysis shows trace leuks but negative urine spun sediment.  -02/04/21-patient with history of BPH and elevated PSA. Has had 2-biopsies in  January of 2001 and in February of 2008. PSAs run baseline around 8. Here for follow-up. No interim GU issues.  -08/05/21-patient with history of elevated PSA as above. Had 2-prostate biopsies last 1 in February 2008 with prostate volume being 50 cc at that time. No interim GU issues. Here now for follow-up. Had recent PSA is now up to 12.80 on 07/29/2021. Baseline runs between 7 and 8. Here to discuss next steps of management .  -09/18/21-patient with history of elevated PSA with negative prostate biopsy x2 last 1 in February of 2008 as above. Most recent PSA up to 12.8 on 07/29/2021. He has undergone MRI of the prostate in the interim on 08/27/2021 which shows 3 separate Pi Rad 3 lesions in the left peripheral zone the right for peripheral zone and right peripheral zone at the midgland and apex. Prostatic volume measured at 66 cc.   CLINICAL DATA: Elevated PSA level of 12.8. Enlarged prostate over  the last 10 years. Remote prior biopsy 10 years ago is reportedly  benign.   EXAM:  MR PROSTATE WITHOUT AND WITH CONTRAST   TECHNIQUE:  Multiplanar multisequence MRI images were obtained of the pelvis  centered about the prostate. Pre and post contrast images were  obtained.   CONTRAST: 57mL MULTIHANCE GADOBENATE DIMEGLUMINE 529 MG/ML IV SOLN   COMPARISON: None.   FINDINGS:  Prostate:   There is a background of hazy stranding diffusely throughout the  peripheral zone, likely postinflammatory and considered PI-RADS  category 2.  Region of interest # 1: PI-RADS category 3 lesion of the left  anterior and posterolateral peripheral zone in the mid gland  extending into the left anterior and posterolateral peripheral zone  of the apex, with focally reduced T2 signal but without substantial  restriction of diffusion or focal early enhancement. This measures  1.93 cc (1.6 by 1.1 by 2.3 cm) and is shown for example on image 50  series 9.   Region of interest # 2: PI-RADS category 3 lesion of  the right  posterolateral peripheral zone in the mid gland and apex with  reduced T2 signal but no focal early enhancement or restricted  diffusion. This measures 0.69 cc (1.3 by 0.8 by 1.0 cm) and is shown  for example on image 17 series 8.   Region of interest # 3: PI-RADS category 3 lesion of the  posteromedial peripheral zone in the mid gland and apex bilaterally,  although eccentric to the right. This has reduced T2 signal but no  focal early enhancement or focal restricted diffusion. This measures  2.00 cc (2.4 by 1.1 by 1.4 cm) and is shown for example on image 14  series 8.   Encapsulated nodularity in the transition zone compatible with  benign prostatic hypertrophy.   Volume: 3D volumetric analysis: Prostate volume 66.00 cc (5.8 by 4.5  by 5.4 cm).   Transcapsular spread: Absent   Seminal vesicle involvement: Absent   Neurovascular bundle involvement: Absent   Pelvic adenopathy: Absent   Bone metastasis: Absent   Other findings: No supplemental non-categorized findings.   IMPRESSION:  1. There are three PI-RADS category 3 lesions in the peripheral  zone. Targeting data sent to [facility].  2. Background hazy stranding diffusely throughout the peripheral  zone, likely postinflammatory and considered PI-RADS category 2.  3. Encapsulated nodularity in the transition zone compatible with  benign prostatic hypertrophy. Mild prostatomegaly noted.   -10/11/21-patient with history of elevated PSA with negative biopsies as above. Had recent MRI showing 3 intermediate grade lesions. Now for prostate fusion biopsy.   -10/18/20-Here to discuss TRUS/BX results.  Path: 2/12 cores Positive for adenocarcinoma Gleason's 3+3 equal 6,2 cores showed atypia   12/19/2021: Here today for preoperative appointment prior to undergoing radioactive seed implant and Space[facility] on 1/3 for definitive management of his underlying prostate cancer diagnosis. He denies any changes in past medical  history, prescription medications taken on a daily basis, no interval surgical or procedural interventions. Continues to void at his baseline with stable and grossly nonbothersome symptomology. He has had no interval dysuria, hematuria, interval treatment for UTI. Patient denies any constipation. No recent fevers or chills, nausea/vomiting, chest pain, shortness of breath, numbness or tingling in the extremities.     ALLERGIES: BuSpar TABS Statins     MEDICATIONS: Levofloxacin 750 mg tablet Take 1 tab po 1 hour prior to procedure  Acyclovir 400 mg tablet Oral  Betamethasone Valerate 0.1 % lotion External  Cyclobenzaprine Hcl 10 mg tablet Oral  Lisinopril 5 mg tablet Oral  Valium 10 mg tablet Take 1 tab po 1 hour prior to procedure  Xanax 0.5 mg tablet Oral     GU PSH: Prostate Needle Biopsy - 10/11/2021, 2011       [facility] Notes: Biopsy Of The Prostate Needle, Appendectomy   NON-GU PSH: Appendectomy - 2008 Surgical Pathology, Gross And Microscopic Examination For Prostate Needle - 10/11/2021     GU PMH: Prostate Cancer - 10/18/2021 Elevated PSA - 10/11/2021, - 09/18/2021, - 08/05/2021, - 02/04/2021,  He has a normal prostate but a PSA that is increased slightly from the last time it was checked but overall appears to be stable but elevated. We did discuss prostate biopsy as 1 option and he also inquired about other ways to evaluate the prostate so we discussed prostate MRI and its utility in fusion biopsy should a high-grade lesion be identified. He would like to proceed with that and will be scheduled for a prostate MRI prior to his follow-up in 6 months., - 2021 (Stable), His prostate was noted to be benign on exam with a PSA of 8.68. He is going to his PSA checked in 3 months and then again in 6 months., - 2020 (Stable), His prostate remains benign. His PSA is currently pending but he did have intercourse prior to having his PSA drawn so it may be up again. At this point though he has had 2  previous biopsies that were negative and has a benign exam so I am going to recommend continued monitoring with a repeat PSA in 6 months and DRE and PSA again in 12 months., - 2019 (Stable), His prostate remains benign and his PSA, although elevated, remains essentially stable over time. I have recommended continued yearly DRE and PSA with a check of his PSA again in 6 months as well., - 2017, Elevated prostate specific antigen (PSA), - 2016 BPH w/o LUTS - 09/18/2021, - 08/05/2021, - 02/04/2021, Benign enlargement of prostate, - 2016 Epididymitis - 08/06/2020 Right testicular pain - 08/06/2020 Urinary Urgency, He has urgency with mild, occasional, small volume urge incontinence. It is not enough of a bother to want to try any medication such as Myrbetriq but I told him that it was an option should his symptoms worsen - 2017 Male ED, unspecified, Erectile dysfunction - 2014 Spermatocele of epididymis, Unspec, Spermatocele - 2014      PMH Notes: Left spermatocele: A scrotal ultrasound done on 02/14/10 revealed a 7 mm left epididymal cyst/spermatocele but no other intrascrotal pathology.   He does have BPH by examination but no significant obstructive voiding symptoms.   Erectile dysfunction: This has responded to phosphodiesterase inhibitor therapy.  Treatment: Levitra     NON-GU PMH: Encounter for general adult medical examination without abnormal findings, Encounter for preventive health examination - 2016 Anxiety, Anxiety (Symptom) - 2014 Personal history of other diseases of the circulatory system, History of hypertension - 2014 Personal history of other endocrine, nutritional and metabolic disease, History of hypercholesterolemia - 2014    FAMILY HISTORY: Acute Myocardial Infarction - Mother Prostate Cancer - Father   SOCIAL HISTORY: Marital Status: Divorced Preferred Language: English; Ethnicity: Not Hispanic Or Latino; Race: White Current Smoking Status: Patient does not smoke anymore.   Does drink.  Drinks 1 caffeinated drink per day.     Notes: Former smoker, Marital History - Single, Tobacco Use, Caffeine Use, Alcohol Use   REVIEW OF SYSTEMS:    GU Review Male:   Patient denies frequent urination, hard to postpone urination, burning/ pain with urination, get up at night to urinate, leakage of urine, stream starts and stops, trouble starting your stream, have to strain to urinate , erection problems, and penile pain.  Gastrointestinal (Upper):   Patient denies nausea, vomiting, and indigestion/ heartburn.  Gastrointestinal (Lower):   Patient denies diarrhea and constipation.  Constitutional:   Patient denies fever, night sweats, weight loss, and fatigue.  Skin:   Patient denies itching and skin rash/ lesion.  Eyes:   Patient denies blurred vision and  double vision.  Ears/ Nose/ Throat:   Patient denies sore throat and sinus problems.  Hematologic/Lymphatic:   Patient denies swollen glands and easy bruising.  Cardiovascular:   Patient denies leg swelling and chest pains.  Respiratory:   Patient denies cough and shortness of breath.  Endocrine:   Patient denies excessive thirst.  Musculoskeletal:   Patient denies back pain and joint pain.  Neurological:   Patient denies headaches and dizziness.  Psychologic:   Patient denies depression and anxiety.   VITAL SIGNS:      12/19/2021 10:03 AM  Weight 174 lb / 78.93 kg  Height 66.5 in / 168.91 cm  BP 159/101 mmHg  Pulse 71 /min  Temperature 98.4 F / 36.8 C  BMI 27.7 kg/m   MULTI-SYSTEM PHYSICAL EXAMINATION:    Constitutional: Well-nourished. No physical deformities. Normally developed. Good grooming.  Neck: Neck symmetrical, not swollen. Normal tracheal position.  Respiratory: No labored breathing, no use of accessory muscles.   Cardiovascular: Normal temperature, normal extremity pulses, no swelling, no varicosities.  Skin: No paleness, no jaundice, no cyanosis. No lesion, no ulcer, no rash.  Neurologic /  Psychiatric: Oriented to time, oriented to place, oriented to person. No depression, no anxiety, no agitation.  Musculoskeletal: Normal gait and station of head and neck.     Complexity of Data:  Source Of History:  Patient, Medical Record Summary  Lab Test Review:   PSA  Records Review:   Pathology Reports, Previous Doctor Records, Previous Hospital Records, Previous Patient Records  Urine Test Review:   Urinalysis   07/29/21 02/04/21 05/01/20 01/30/20 11/03/19 07/27/19 03/30/19 02/09/19  PSA  Total PSA 12.80 ng/mL 7.26 ng/mL 8.63 ng/mL 7.54 ng/dl 8.68 ng/mL 9.27 ng/dl 7.79 ng/mL 7.66 ng/dl  Free PSA  1.24 ng/mL 1.58 ng/mL  1.29 ng/mL  2.20 ng/mL   % Free PSA  17 % PSA 18 % PSA  15 % PSA  28 % PSA     12/19/21  Urinalysis  Urine Appearance Clear   Urine Color Straw   Urine Glucose Neg mg/dL  Urine Bilirubin Neg mg/dL  Urine Ketones Neg mg/dL  Urine Specific Gravity 1.010   Urine Blood Neg ery/uL  Urine pH 6.5   Urine Protein Neg mg/dL  Urine Urobilinogen 0.2 mg/dL  Urine Nitrites Neg   Urine Leukocyte Esterase Neg leu/uL   PROCEDURES:          Urinalysis Dipstick Dipstick Cont'd  Color: Straw Bilirubin: Neg mg/dL  Appearance: Clear Ketones: Neg mg/dL  Specific Gravity: 1.010 Blood: Neg ery/uL  pH: 6.5 Protein: Neg mg/dL  Glucose: Neg mg/dL Urobilinogen: 0.2 mg/dL    Nitrites: Neg    Leukocyte Esterase: Neg leu/uL    ASSESSMENT:      ICD-10 Details  1 GU:   Prostate Cancer - C61 Chronic, Threat to Bodily Function  2 NON-GU:   Encounter for other preprocedural examination - Z01.818 Undiagnosed New Problem   PLAN:           Orders Labs Urine Culture          Schedule Return Visit/Planned Activity: Keep Scheduled Appointment - Schedule Surgery, Follow up MD          Document Letter(s):  Created for Patient: Clinical Summary         Notes:   All questions answered to the best of my ability regarding the upcoming procedure and expected postoperative course  with understanding expressed by the patient. Precautionary urine culture sent today. Moving forward  he will proceed with previously scheduled brachytherapy seed implant on 1/3 with his urologist.   Agree with above history and PE and plan for interstitial brachytherapy

## 2021-12-24 ENCOUNTER — Ambulatory Visit (HOSPITAL_BASED_OUTPATIENT_CLINIC_OR_DEPARTMENT_OTHER): Admission: RE | Admit: 2021-12-24 | Payer: Medicare Other | Source: Ambulatory Visit | Admitting: Urology

## 2021-12-24 SURGERY — INSERTION, RADIATION SOURCE, PROSTATE
Anesthesia: Choice

## 2021-12-25 ENCOUNTER — Telehealth: Payer: Self-pay | Admitting: Family Medicine

## 2021-12-25 NOTE — Telephone Encounter (Signed)
urse Assessment Nurse: Fredderick Phenix, RN, Lelan Pons Date/Time John Wagner Time): 12/25/2021 2:54:57 PM Confirm and document reason for call. If symptomatic, describe symptoms. ---Caller states he has been sick since Monday. He has a headache, body pains, bones ache, runny nose, and a cough. Does the patient have any new or worsening symptoms? ---Yes Will a triage be completed? ---Yes Related visit to physician within the last 2 weeks? ---No Does the PT have any chronic conditions? (i.e. diabetes, asthma, this includes High risk factors for pregnancy, etc.) ---No Is this a behavioral health or substance abuse call? ---No Guidelines Guideline Title Affirmed Question Affirmed Notes Nurse Date/Time John Wagner Time) Common Cold Care advice for mild cough, questions about Stormy Card 12/25/2021 2:56:40 PM Disp. Time John Wagner Time) Disposition Final User 12/25/2021 3:03:03 PM Home Care Yes Fredderick Phenix, RN, Karie Chimera NOTE: All timestamps contained within this report are represented as [place] Standard Time. CONFIDENTIALTY NOTICE: This fax transmission is intended only for the addressee. It contains information that is legally privileged, confidential or otherwise protected from use or disclosure. If you are not the intended recipient, you are strictly prohibited from reviewing, disclosing, copying using or disseminating any of this information or taking any action in reliance on or regarding this information. If you have received this fax in error, please notify us immediately by telephone so that we can arrange for its return to [place]. Phone: [phone], Toll-Free: [phone], Fax: [phone] Page: 2 of 2 Call Id: [id number] Jane Disagree/Comply Comply Caller Understands Yes PreDisposition Did not know what to do Care Advice Given Per Guideline HOME CARE: * You should be able to treat this at home. REASSURANCE AND EDUCATION - COUGH: * It doesn't sound like a serious cough. HUMIDIFIER: * If the air is  dry, use a humidifier in the bedroom. * Dry air makes coughs worse. CALL BACK IF: CARE ADVICE given per Common Cold (Adult) guideline.

## 2021-12-25 NOTE — Telephone Encounter (Signed)
Patient states he has been very sick (stuffy nose, body aches, no fever) and needed an appointment, unable to fit him in for today so an appointment was made for tomorrow with Rodena Goldmann at 5:40pm. While making the appointment patient sounded very congested and extremely SoB. Patient was triaged to make sure he was ok to wait until tomorrow.

## 2021-12-26 ENCOUNTER — Telehealth: Payer: Medicare Other | Admitting: Family Medicine

## 2021-12-26 NOTE — Telephone Encounter (Signed)
Pt had an appointment set today at 5:40 PM with Colin Benton but was cancelled. I called to see why but had to lvm.

## 2022-01-05 NOTE — Patient Instructions (Addendum)
It was good to see you again today!  I am sorry you are going through this prostate cancer  Please let me know what I can do to help  I would suggest getting another COVID booster and the shingles vaccine at your convenience if not done already  Flu shot today   Assuming all is well please see me in about 6 months

## 2022-01-05 NOTE — Progress Notes (Signed)
[facility] [address] [place], [place] [address] [phone]  Date:  01/13/2022   Name:  John Wagner.   DOB:  [birth year]   MRN:  [id number]  PCP:  Darreld Mclean, MD    Chief Complaint: 6 month follow up (Concerns/ questions: pt says his surgery for prostate cancer has been postponed d/t him having the flu/)   History of Present Illness:  John Wagner. is a 71 y.o. very pleasant male patient who presents with the following:  Patient seen today for periodic follow-up-  History of prediabetes, BPH, hypertension, hyperlipidemia, anxiety treated with xanax, joint pain  Most recent visit with myself was in July-at that time he was having presyncopal episodes, we thought likely orthostatic hypotension.  We stopped his lisinopril, ordered carotid ultrasound and a Zio patch All work-up looked okay He followed up with cardiology, Dr. Harriet Masson; she noted symptoms had mostly resolved by the time she saw him.  They did do an echocardiogram which was reassuring 1. Left ventricular ejection fraction, by estimation, is 55 to 60%. The  left ventricle has normal function. The left ventricle has no regional  wall motion abnormalities. Left ventricular diastolic parameters are  consistent with Grade I diastolic  dysfunction (impaired relaxation).   2. Right ventricular systolic function is normal. The right ventricular  size is normal.   3. The mitral valve is normal in structure. Mild mitral valve  regurgitation. No evidence of mitral stenosis.   4. The aortic valve is normal in structure. Aortic valve regurgitation is  mild. No aortic stenosis is present.   5. Aneurysm of the ascending aorta, measuring 39 mm.   6. The inferior vena cava is normal in size with greater than 50%  respiratory variability, suggesting right atrial pressure of 3 mmHg.   However, unfortunately the meantime he was diagnosed with prostate cancer. He  has been long followed by urology for BPH-PSA in August of last year went up so he had an MRI, then biopsy which confirmed prostate cancer  He also got the flu over new years- this delayed his surgery He and his girlfriend went out for New Year's Eve, he thinks this is where he got the flu.  He had typical flu symptoms but did not actually get tested for same radioactive seed implantation However it is now planned for about 10 days from now -per current understanding this will complete his treatment He will go home the same day, with a catherter hopefully just for a day     COVID booster; recommend  Flu shot- not done - will update today  Shingrix  He may feel fatigued, aches and pains-he thinks due to aging  He still works in [employer]- he is busy, full time He uses tramadol as needed for joint pains  He has not done any bagpipe playing the last few weeks   Depression screen negative  Patient Active Problem List   Diagnosis Date Noted   Malignant neoplasm of prostate ([facility]) 10/25/2021   Elevated PSA    Pre-diabetes 01/24/2016   BPH (benign prostatic hyperplasia) 12/25/2014   Erectile dysfunction 03/10/2014   Anxiety 12/06/2012   Psoriasis 12/06/2012   Hypertension    Hyperlipidemia     Past Medical History:  Diagnosis Date   Anxiety    Elevated PSA    Seeing Dr. Para March   Hyperlipidemia    Hypertension    Psoriasis  Past Surgical History:  Procedure Laterality Date   APPENDECTOMY      Social History   Tobacco Use   Smoking status: Former   Smokeless tobacco: Never  Substance Use Topics   Alcohol use: Yes   Drug use: No    Family History  Problem Relation Age of Onset   Cancer Father        prostate cancer   Hypertension Mother    Heart disease Mother     Allergies  Allergen Reactions   Buspar [Buspirone]     CNS   Statins     myalgias    Medication list has been reviewed and updated.  Current Outpatient Medications on File Prior to  Visit  Medication Sig Dispense Refill   acyclovir (ZOVIRAX) 400 MG tablet TAKE 1 TABLET BY MOUTH THREE TIMES DAILY AS NEEDED FOR OUTBREAKS 45 tablet 3   betamethasone valerate lotion (VALISONE) 0.1 % Apply 1 application topically 2 (two) times daily. Reported on 01/23/2016     Evolocumab (REPATHA SURECLICK) 741 MG/ML SOAJ Inject 140 mg into the skin every 14 (fourteen) days. 2 mL 11   hydrOXYzine (ATARAX/VISTARIL) 25 MG tablet Take 1 tablet (25 mg total) by mouth every 8 (eight) hours as needed for itching. 21 tablet 0   lisinopril (ZESTRIL) 5 MG tablet Take 1 tablet (5 mg total) by mouth daily. 90 tablet 3   propranolol (INDERAL) 10 MG tablet Use twice a day as needed for performance anxiety 30 tablet 3   No current facility-administered medications on file prior to visit.    Review of Systems:  As per HPI- otherwise negative.   Physical Examination: Vitals:   01/13/22 0910  BP: 130/88  Pulse: 80  Resp: 18  Temp: 98.1 F (36.7 C)  SpO2: 99%   Vitals:   01/13/22 0910  Height: 5' 6.5" (1.689 m)   Body mass index is 28.64 kg/m. Ideal Body Weight: Weight in (lb) to have BMI = 25: 156.9  GEN: no acute distress. Mild overweight, looks well  HEENT: Atraumatic, Normocephalic.  Ears and Nose: No external deformity. CV: RRR, No M/G/R. No JVD. No thrill. No extra heart sounds. PULM: CTA B, no wheezes, crackles, rhonchi. No retractions. No resp. distress. No accessory muscle use. ABD: S, NT, ND. No rebound. No HSM. EXTR: No c/c/e PSYCH: Normally interactive. Conversant.    Assessment and Plan: Immunization due  Erectile dysfunction, unspecified erectile dysfunction type - Plan: tadalafil (CIALIS) 20 MG tablet  Chronic knee pain, unspecified laterality - Plan: traMADol (ULTRAM) 50 MG tablet  Chronic right shoulder pain - Plan: traMADol (ULTRAM) 50 MG tablet  GAD (generalized anxiety disorder) - Plan: ALPRAZolam (XANAX) 1 MG tablet  Gentleman seen today for follow-up.  We  discussed his planned prostate cancer treatment Refill of Cialis Uses tramadol as needed for chronic pain of knee and shoulder, refilled Alprazolam as needed for anxiety Gave flu shot today, discussed shingles vaccine and COVID booster-should be done at his pharmacy at his convenience  Signed Lamar Blinks, MD

## 2022-01-13 ENCOUNTER — Encounter: Payer: Self-pay | Admitting: Family Medicine

## 2022-01-13 ENCOUNTER — Ambulatory Visit (INDEPENDENT_AMBULATORY_CARE_PROVIDER_SITE_OTHER): Payer: PPO | Admitting: Family Medicine

## 2022-01-13 VITALS — BP 130/88 | HR 80 | Temp 98.1°F | Resp 18 | Ht 66.5 in

## 2022-01-13 DIAGNOSIS — M25569 Pain in unspecified knee: Secondary | ICD-10-CM | POA: Diagnosis not present

## 2022-01-13 DIAGNOSIS — F411 Generalized anxiety disorder: Secondary | ICD-10-CM

## 2022-01-13 DIAGNOSIS — M25511 Pain in right shoulder: Secondary | ICD-10-CM

## 2022-01-13 DIAGNOSIS — N529 Male erectile dysfunction, unspecified: Secondary | ICD-10-CM | POA: Diagnosis not present

## 2022-01-13 DIAGNOSIS — G8929 Other chronic pain: Secondary | ICD-10-CM

## 2022-01-13 DIAGNOSIS — Z23 Encounter for immunization: Secondary | ICD-10-CM

## 2022-01-13 MED ORDER — TADALAFIL 20 MG PO TABS: 20.0000 mg | ORAL_TABLET | Freq: Every day | ORAL | 2 refills | Status: DC | PRN

## 2022-01-13 MED ORDER — TRAMADOL HCL 50 MG PO TABS: 50.0000 mg | ORAL_TABLET | Freq: Three times a day (TID) | ORAL | 3 refills | Status: DC | PRN

## 2022-01-13 MED ORDER — ALPRAZOLAM 1 MG PO TABS: ORAL_TABLET | ORAL | 3 refills | Status: DC

## 2022-01-16 ENCOUNTER — Encounter (HOSPITAL_BASED_OUTPATIENT_CLINIC_OR_DEPARTMENT_OTHER): Payer: Self-pay | Admitting: Urology

## 2022-01-16 ENCOUNTER — Other Ambulatory Visit: Payer: Self-pay

## 2022-01-16 NOTE — Progress Notes (Signed)
°   01/16/22 1040  OBSTRUCTIVE SLEEP APNEA  Have you ever been diagnosed with sleep apnea through a sleep study? No  Do you snore loudly (loud enough to be heard through closed doors)?  1  Do you often feel tired, fatigued, or sleepy during the daytime (such as falling asleep during driving or talking to someone)? 1  Has anyone observed you stop breathing during your sleep? 0  Do you have, or are you being treated for high blood pressure? 1  BMI more than 35 kg/m2? 0  Age > 45 (1-yes) 1  Male Gender (Yes=1) 1  Obstructive Sleep Apnea Score 5  Score 5 or greater  Results sent to PCP

## 2022-01-16 NOTE — Progress Notes (Addendum)
Spoke w/ via phone for pre-op interview---patient Lab needs dos----           istat  Lab results------EKG 07/10/21; Echo 08/13/21. COVID test -----patient states asymptomatic no test needed. Arrive at -------11:45am 01/21/22 NPO after MN NO Solid Food.  Clear liquids from MN until---10:45am DOS Med rec completed. Medications to take morning of surgery -----Xanax PRN Diabetic medication -----NA Patient instructed to bring photo id and insurance card day of surgery Patient aware to have Driver (ride ) / caregiver    for 24 hours after surgery  Patient Special Instructions -----NA Pre-Op special Istructions -----NA Patient verbalized understanding of instructions that were given at this phone interview. Patient denies shortness of breath, chest pain, fever, cough at this phone interview.

## 2022-01-20 ENCOUNTER — Other Ambulatory Visit: Payer: Self-pay | Admitting: Urology

## 2022-01-20 ENCOUNTER — Telehealth: Payer: Self-pay | Admitting: *Deleted

## 2022-01-20 NOTE — Progress Notes (Signed)
Received call from pt today asked about bowel prep since is having trouble with having a bowel movement today, he can take or do whatever he normally does at home.  Pt verbalized understanding he his to do one fleet enema morning of surgery prior to arrival. Also, noted pre-op orders where entered today.  Pt will need lab work done in pre-op, CBC/ CMP/ PT/ PTT

## 2022-01-20 NOTE — Telephone Encounter (Signed)
CALLED PATIENT TO REMIND OF PROCEDURE FOR 01-21-22, SPOKE WITH PATIENT AND HE IS AWARE OF THIS PROCEDURE

## 2022-01-20 NOTE — H&P (Signed)
John Wagner is a 71 year-old male established patient who is here for follow up for further evaluation of his elevated PSA.   His last PSA was performed 05/01/2020. The last PSA value was 8.63.   He has had a prostate biopsy. Patient does not have a family history of prostate cancer. The patient states he does not take 5 alpha reductase inhibitor medication.   He has not recently had unwanted weight loss. He is not having new bone pain. The patient complains of lower urinary tract symptom(s) that include urgency.   Elevated PSA: He has a history of elevated PSA which had fallen with antibiotic therapy in the past.  TRUS/BX 1/01: Performed for a PSA elevation that did not fall significantly with antibiotics.  Pathology: BPH only.  TRUS/BX 01/29/07: PSA of 10.0 with a normal DRE.  Pathology: BPH and inflammation but no evidence of malignancy.   05/08/20: The patient returns today for follow-up of an elevated PSA. He also has known BPH and tells me that occasionally he will have a sensation of hesitancy and is also worried times that he is not emptying his bladder but he said he feels as though he empties his bladder completely. He otherwise is without urologic complaint.  -08/06/20 patient with history of PSA elevation running between 7 and 8 as above. Has had 2-biopsies in January 2001 and in February of 2008. Most recent PSA is 7.65 on 08/03/2020 which is down from previous value of 8.68 in May of 2021.   Main new GU issue is some acute onset of right-sided testicular pain which started after playing the bag pipes by his report. Noted some swelling and tenderness approximately 4 days ago. Was seen by PCP placed him on Bactrim DS and seems to be improving slightly since then. Current symptoms are moderate severity. Denies any fever or urinary symptomatology. Urinalysis shows trace leuks but negative urine spun sediment.  -02/04/21-patient with history of BPH and elevated PSA. Has had 2-biopsies in  January of 2001 and in February of 2008. PSAs run baseline around 8. Here for follow-up. No interim GU issues.  -08/05/21-patient with history of elevated PSA as above. Had 2-prostate biopsies last 1 in February 2008 with prostate volume being 50 cc at that time. No interim GU issues. Here now for follow-up. Had recent PSA is now up to 12.80 on 07/29/2021. Baseline runs between 7 and 8. Here to discuss next steps of management .  -09/18/21-patient with history of elevated PSA with negative prostate biopsy x2 last 1 in February of 2008 as above. Most recent PSA up to 12.8 on 07/29/2021. He has undergone MRI of the prostate in the interim on 08/27/2021 which shows 3 separate Pi Rad 3 lesions in the left peripheral zone the right for peripheral zone and right peripheral zone at the midgland and apex. Prostatic volume measured at 66 cc.   CLINICAL DATA: Elevated PSA level of 12.8. Enlarged prostate over  the last 10 years. Remote prior biopsy 10 years ago is reportedly  benign.   EXAM:  MR PROSTATE WITHOUT AND WITH CONTRAST   TECHNIQUE:  Multiplanar multisequence MRI images were obtained of the pelvis  centered about the prostate. Pre and post contrast images were  obtained.   CONTRAST: 75mL MULTIHANCE GADOBENATE DIMEGLUMINE 529 MG/ML IV SOLN   COMPARISON: None.   FINDINGS:  Prostate:   There is a background of hazy stranding diffusely throughout the  peripheral zone, likely postinflammatory and considered PI-RADS  category 2.  Region of interest # 1: PI-RADS category 3 lesion of the left  anterior and posterolateral peripheral zone in the mid gland  extending into the left anterior and posterolateral peripheral zone  of the apex, with focally reduced T2 signal but without substantial  restriction of diffusion or focal early enhancement. This measures  1.93 cc (1.6 by 1.1 by 2.3 cm) and is shown for example on image 50  series 9.   Region of interest # 2: PI-RADS category 3 lesion of  the right  posterolateral peripheral zone in the mid gland and apex with  reduced T2 signal but no focal early enhancement or restricted  diffusion. This measures 0.69 cc (1.3 by 0.8 by 1.0 cm) and is shown  for example on image 17 series 8.   Region of interest # 3: PI-RADS category 3 lesion of the  posteromedial peripheral zone in the mid gland and apex bilaterally,  although eccentric to the right. This has reduced T2 signal but no  focal early enhancement or focal restricted diffusion. This measures  2.00 cc (2.4 by 1.1 by 1.4 cm) and is shown for example on image 14  series 8.   Encapsulated nodularity in the transition zone compatible with  benign prostatic hypertrophy.   Volume: 3D volumetric analysis: Prostate volume 66.00 cc (5.8 by 4.5  by 5.4 cm).   Transcapsular spread: Absent   Seminal vesicle involvement: Absent   Neurovascular bundle involvement: Absent   Pelvic adenopathy: Absent   Bone metastasis: Absent   Other findings: No supplemental non-categorized findings.   IMPRESSION:  1. There are three PI-RADS category 3 lesions in the peripheral  zone. Targeting data sent to [place].  2. Background hazy stranding diffusely throughout the peripheral  zone, likely postinflammatory and considered PI-RADS category 2.  3. Encapsulated nodularity in the transition zone compatible with  benign prostatic hypertrophy. Mild prostatomegaly noted.   -10/11/21-patient with history of elevated PSA with negative biopsies as above. Had recent MRI showing 3 intermediate grade lesions. Now for prostate fusion biopsy.   -10/18/20-Here to discuss TRUS/BX results.  Path: 2/12 cores Positive for adenocarcinoma Gleason's 3+3 equal 6,2 cores showed atypia   12/19/2021: Here today for preoperative appointment prior to undergoing radioactive seed implant and Space[facility] on 1/3 for definitive management of his underlying prostate cancer diagnosis. He denies any changes in past medical  history, prescription medications taken on a daily basis, no interval surgical or procedural interventions. Continues to void at his baseline with stable and grossly nonbothersome symptomology. He has had no interval dysuria, hematuria, interval treatment for UTI. Patient denies any constipation. No recent fevers or chills, nausea/vomiting, chest pain, shortness of breath, numbness or tingling in the extremities.     ALLERGIES: BuSpar TABS Statins     MEDICATIONS: Levofloxacin 750 mg tablet Take 1 tab po 1 hour prior to procedure  Acyclovir 400 mg tablet Oral  Betamethasone Valerate 0.1 % lotion External  Cyclobenzaprine Hcl 10 mg tablet Oral  Lisinopril 5 mg tablet Oral  Valium 10 mg tablet Take 1 tab po 1 hour prior to procedure  Xanax 0.5 mg tablet Oral     GU PSH: Prostate Needle Biopsy - 10/11/2021, 2011       [place] Notes: Biopsy Of The Prostate Needle, Appendectomy   NON-GU PSH: Appendectomy - 2008 Surgical Pathology, Gross And Microscopic Examination For Prostate Needle - 10/11/2021     GU PMH: Prostate Cancer - 10/18/2021 Elevated PSA - 10/11/2021, - 09/18/2021, - 08/05/2021, - 02/04/2021,  He has a normal prostate but a PSA that is increased slightly from the last time it was checked but overall appears to be stable but elevated. We did discuss prostate biopsy as 1 option and he also inquired about other ways to evaluate the prostate so we discussed prostate MRI and its utility in fusion biopsy should a high-grade lesion be identified. He would like to proceed with that and will be scheduled for a prostate MRI prior to his follow-up in 6 months., - 2021 (Stable), His prostate was noted to be benign on exam with a PSA of 8.68. He is going to his PSA checked in 3 months and then again in 6 months., - 2020 (Stable), His prostate remains benign. His PSA is currently pending but he did have intercourse prior to having his PSA drawn so it may be up again. At this point though he has had 2  previous biopsies that were negative and has a benign exam so I am going to recommend continued monitoring with a repeat PSA in 6 months and DRE and PSA again in 12 months., - 2019 (Stable), His prostate remains benign and his PSA, although elevated, remains essentially stable over time. I have recommended continued yearly DRE and PSA with a check of his PSA again in 6 months as well., - 2017, Elevated prostate specific antigen (PSA), - 2016 BPH w/o LUTS - 09/18/2021, - 08/05/2021, - 02/04/2021, Benign enlargement of prostate, - 2016 Epididymitis - 08/06/2020 Right testicular pain - 08/06/2020 Urinary Urgency, He has urgency with mild, occasional, small volume urge incontinence. It is not enough of a bother to want to try any medication such as Myrbetriq but I told him that it was an option should his symptoms worsen - 2017 Male ED, unspecified, Erectile dysfunction - 2014 Spermatocele of epididymis, Unspec, Spermatocele - 2014      PMH Notes: Left spermatocele: A scrotal ultrasound done on 02/14/10 revealed a 7 mm left epididymal cyst/spermatocele but no other intrascrotal pathology.   He does have BPH by examination but no significant obstructive voiding symptoms.   Erectile dysfunction: This has responded to phosphodiesterase inhibitor therapy.  Treatment: Levitra     NON-GU PMH: Encounter for general adult medical examination without abnormal findings, Encounter for preventive health examination - 2016 Anxiety, Anxiety (Symptom) - 2014 Personal history of other diseases of the circulatory system, History of hypertension - 2014 Personal history of other endocrine, nutritional and metabolic disease, History of hypercholesterolemia - 2014    FAMILY HISTORY: Acute Myocardial Infarction - Mother Prostate Cancer - Father   SOCIAL HISTORY: Marital Status: Divorced Preferred Language: English; Ethnicity: Not Hispanic Or Latino; Race: White Current Smoking Status: Patient does not smoke anymore.   Does drink.  Drinks 1 caffeinated drink per day.     Notes: Former smoker, Marital History - Single, Tobacco Use, Caffeine Use, Alcohol Use   REVIEW OF SYSTEMS:    GU Review Male:   Patient denies frequent urination, hard to postpone urination, burning/ pain with urination, get up at night to urinate, leakage of urine, stream starts and stops, trouble starting your stream, have to strain to urinate , erection problems, and penile pain.  Gastrointestinal (Upper):   Patient denies nausea, vomiting, and indigestion/ heartburn.  Gastrointestinal (Lower):   Patient denies diarrhea and constipation.  Constitutional:   Patient denies fever, night sweats, weight loss, and fatigue.  Skin:   Patient denies itching and skin rash/ lesion.  Eyes:   Patient denies blurred vision and  double vision.  Ears/ Nose/ Throat:   Patient denies sore throat and sinus problems.  Hematologic/Lymphatic:   Patient denies swollen glands and easy bruising.  Cardiovascular:   Patient denies leg swelling and chest pains.  Respiratory:   Patient denies cough and shortness of breath.  Endocrine:   Patient denies excessive thirst.  Musculoskeletal:   Patient denies back pain and joint pain.  Neurological:   Patient denies headaches and dizziness.  Psychologic:   Patient denies depression and anxiety.   VITAL SIGNS:      12/19/2021 10:03 AM  Weight 174 lb / 78.93 kg  Height 66.5 in / 168.91 cm  BP 159/101 mmHg  Pulse 71 /min  Temperature 98.4 F / 36.8 C  BMI 27.7 kg/m   MULTI-SYSTEM PHYSICAL EXAMINATION:    Constitutional: Well-nourished. No physical deformities. Normally developed. Good grooming.  Neck: Neck symmetrical, not swollen. Normal tracheal position.  Respiratory: No labored breathing, no use of accessory muscles.   Cardiovascular: Normal temperature, normal extremity pulses, no swelling, no varicosities.  Skin: No paleness, no jaundice, no cyanosis. No lesion, no ulcer, no rash.  Neurologic /  Psychiatric: Oriented to time, oriented to place, oriented to person. No depression, no anxiety, no agitation.  Musculoskeletal: Normal gait and station of head and neck.     Complexity of Data:  Source Of History:  Patient, Medical Record Summary  Lab Test Review:   PSA  Records Review:   Pathology Reports, Previous Doctor Records, Previous Hospital Records, Previous Patient Records  Urine Test Review:   Urinalysis   07/29/21 02/04/21 05/01/20 01/30/20 11/03/19 07/27/19 03/30/19 02/09/19  PSA  Total PSA 12.80 ng/mL 7.26 ng/mL 8.63 ng/mL 7.54 ng/dl 8.68 ng/mL 9.27 ng/dl 7.79 ng/mL 7.66 ng/dl  Free PSA  1.24 ng/mL 1.58 ng/mL  1.29 ng/mL  2.20 ng/mL   % Free PSA  17 % PSA 18 % PSA  15 % PSA  28 % PSA     12/19/21  Urinalysis  Urine Appearance Clear   Urine Color Straw   Urine Glucose Neg mg/dL  Urine Bilirubin Neg mg/dL  Urine Ketones Neg mg/dL  Urine Specific Gravity 1.010   Urine Blood Neg ery/uL  Urine pH 6.5   Urine Protein Neg mg/dL  Urine Urobilinogen 0.2 mg/dL  Urine Nitrites Neg   Urine Leukocyte Esterase Neg leu/uL   PROCEDURES:          Urinalysis Dipstick Dipstick Cont'd  Color: Straw Bilirubin: Neg mg/dL  Appearance: Clear Ketones: Neg mg/dL  Specific Gravity: 1.010 Blood: Neg ery/uL  pH: 6.5 Protein: Neg mg/dL  Glucose: Neg mg/dL Urobilinogen: 0.2 mg/dL    Nitrites: Neg    Leukocyte Esterase: Neg leu/uL    ASSESSMENT:      ICD-10 Details  1 GU:   Prostate Cancer - C61 Chronic, Threat to Bodily Function  2 NON-GU:   Encounter for other preprocedural examination - Z01.818 Undiagnosed New Problem   PLAN:           Orders Labs Urine Culture          Schedule Return Visit/Planned Activity: Keep Scheduled Appointment - Schedule Surgery, Follow up MD          Document Letter(s):  Created for Patient: Clinical Summary         Notes:   All questions answered to the best of my ability regarding the upcoming procedure and expected postoperative course  with understanding expressed by the patient. Precautionary urine culture sent today. Moving forward  he will proceed with previously scheduled brachytherapy seed implant on  01/21/22.

## 2022-01-21 ENCOUNTER — Encounter (HOSPITAL_BASED_OUTPATIENT_CLINIC_OR_DEPARTMENT_OTHER)
Admission: RE | Disposition: A | Discharge status: Home / Self Care | Payer: Self-pay | Source: Ambulatory Visit | Attending: Urology

## 2022-01-21 ENCOUNTER — Ambulatory Visit (HOSPITAL_BASED_OUTPATIENT_CLINIC_OR_DEPARTMENT_OTHER): Payer: PPO | Admitting: Anesthesiology

## 2022-01-21 ENCOUNTER — Encounter (HOSPITAL_BASED_OUTPATIENT_CLINIC_OR_DEPARTMENT_OTHER): Payer: Self-pay | Admitting: Urology

## 2022-01-21 ENCOUNTER — Ambulatory Visit (HOSPITAL_BASED_OUTPATIENT_CLINIC_OR_DEPARTMENT_OTHER)
Admission: RE | Admit: 2022-01-21 | Discharge: 2022-01-21 | Disposition: A | Discharge status: Home / Self Care | Payer: PPO | Source: Ambulatory Visit | Attending: Urology | Admitting: Urology

## 2022-01-21 ENCOUNTER — Ambulatory Visit (HOSPITAL_COMMUNITY): Payer: PPO

## 2022-01-21 DIAGNOSIS — Z87891 Personal history of nicotine dependence: Secondary | ICD-10-CM | POA: Diagnosis not present

## 2022-01-21 DIAGNOSIS — R3915 Urgency of urination: Secondary | ICD-10-CM | POA: Insufficient documentation

## 2022-01-21 DIAGNOSIS — N400 Enlarged prostate without lower urinary tract symptoms: Secondary | ICD-10-CM | POA: Insufficient documentation

## 2022-01-21 DIAGNOSIS — R3911 Hesitancy of micturition: Secondary | ICD-10-CM | POA: Diagnosis not present

## 2022-01-21 DIAGNOSIS — E7800 Pure hypercholesterolemia, unspecified: Secondary | ICD-10-CM | POA: Diagnosis not present

## 2022-01-21 DIAGNOSIS — C61 Malignant neoplasm of prostate: Secondary | ICD-10-CM | POA: Insufficient documentation

## 2022-01-21 DIAGNOSIS — I10 Essential (primary) hypertension: Secondary | ICD-10-CM | POA: Diagnosis not present

## 2022-01-21 HISTORY — PX: SPACE OAR INSTILLATION: SHX6769

## 2022-01-21 HISTORY — PX: RADIOACTIVE SEED IMPLANT: SHX5150

## 2022-01-21 HISTORY — DX: Syncope and collapse: R55

## 2022-01-21 LAB — CBC
HCT: 47.8 % (ref 39.0–52.0)
Hemoglobin: 15.9 g/dL (ref 13.0–17.0)
MCH: 28.9 pg (ref 26.0–34.0)
MCHC: 33.3 g/dL (ref 30.0–36.0)
MCV: 86.9 fL (ref 80.0–100.0)
Platelets: 486 10*3/uL — ABNORMAL HIGH (ref 150–400)
RBC: 5.5 MIL/uL (ref 4.22–5.81)
RDW: 13.4 % (ref 11.5–15.5)
WBC: 6.5 10*3/uL (ref 4.0–10.5)
nRBC: 0 % (ref 0.0–0.2)

## 2022-01-21 LAB — COMPREHENSIVE METABOLIC PANEL
ALT: 17 U/L (ref 0–44)
AST: 20 U/L (ref 15–41)
Albumin: 3.8 g/dL (ref 3.5–5.0)
Alkaline Phosphatase: 47 U/L (ref 38–126)
Anion gap: 8 (ref 5–15)
BUN: 13 mg/dL (ref 8–23)
CO2: 27 mmol/L (ref 22–32)
Calcium: 8.7 mg/dL — ABNORMAL LOW (ref 8.9–10.3)
Chloride: 106 mmol/L (ref 98–111)
Creatinine, Ser: 0.63 mg/dL (ref 0.61–1.24)
GFR, Estimated: >60 mL/min (ref >60)
Glucose, Bld: 96 mg/dL (ref 70–99)
Potassium: 4.1 mmol/L (ref 3.5–5.1)
Sodium: 141 mmol/L (ref 135–145)
Total Bilirubin: 1.2 mg/dL (ref 0.3–1.2)
Total Protein: 6.9 g/dL (ref 6.5–8.1)

## 2022-01-21 LAB — PROTIME-INR
INR: 1 (ref 0.8–1.2)
Prothrombin Time: 13.2 seconds (ref 11.4–15.2)

## 2022-01-21 LAB — APTT: aPTT: 24 seconds (ref 24–36)

## 2022-01-21 SURGERY — INSERTION, RADIATION SOURCE, PROSTATE
Anesthesia: General | Site: Prostate

## 2022-01-21 MED ORDER — CEFAZOLIN SODIUM-DEXTROSE 2-4 GM/100ML-% IV SOLN
2.0000 g | Freq: Once | INTRAVENOUS | Status: AC
  Administered 2022-01-21: 2 g via INTRAVENOUS

## 2022-01-21 MED ORDER — ACETAMINOPHEN 500 MG PO TABS
1000.0000 mg | ORAL_TABLET | Freq: Once | ORAL | Status: AC
  Administered 2022-01-21: 1000 mg via ORAL

## 2022-01-21 MED ORDER — OXYCODONE HCL 5 MG PO TABS
ORAL_TABLET | ORAL | Status: AC
  Filled 2022-01-21: qty 1

## 2022-01-21 MED ORDER — AMISULPRIDE (ANTIEMETIC) 5 MG/2ML IV SOLN: 10.0000 mg | Freq: Once | INTRAVENOUS | Status: DC | PRN

## 2022-01-21 MED ORDER — SODIUM CHLORIDE 0.9 % IV SOLN
INTRAVENOUS | Status: AC | PRN
  Administered 2022-01-21: 300 mL via INTRAMUSCULAR

## 2022-01-21 MED ORDER — KETOROLAC TROMETHAMINE 30 MG/ML IJ SOLN
INTRAMUSCULAR | Status: AC
  Filled 2022-01-21: qty 1

## 2022-01-21 MED ORDER — ACETAMINOPHEN 500 MG PO TABS
ORAL_TABLET | ORAL | Status: AC
  Filled 2022-01-21: qty 2

## 2022-01-21 MED ORDER — HYDROMORPHONE HCL 1 MG/ML IJ SOLN
INTRAMUSCULAR | Status: AC
  Filled 2022-01-21: qty 1

## 2022-01-21 MED ORDER — FENTANYL CITRATE (PF) 100 MCG/2ML IJ SOLN
INTRAMUSCULAR | Status: AC
  Filled 2022-01-21: qty 2

## 2022-01-21 MED ORDER — ONDANSETRON HCL 4 MG/2ML IJ SOLN: 4.0000 mg | Freq: Once | INTRAMUSCULAR | Status: DC | PRN

## 2022-01-21 MED ORDER — DEXAMETHASONE SODIUM PHOSPHATE 10 MG/ML IJ SOLN
INTRAMUSCULAR | Status: DC | PRN
  Administered 2022-01-21: 10 mg via INTRAVENOUS

## 2022-01-21 MED ORDER — LIDOCAINE 2% (20 MG/ML) 5 ML SYRINGE
INTRAMUSCULAR | Status: DC | PRN
Start: 2022-01-21 — End: 2022-01-21
  Administered 2022-01-21: 80 mg via INTRAVENOUS

## 2022-01-21 MED ORDER — KETOROLAC TROMETHAMINE 30 MG/ML IJ SOLN
INTRAMUSCULAR | Status: DC | PRN
  Administered 2022-01-21: 30 mg via INTRAVENOUS

## 2022-01-21 MED ORDER — ROCURONIUM BROMIDE 10 MG/ML (PF) SYRINGE
PREFILLED_SYRINGE | INTRAVENOUS | Status: DC | PRN
  Administered 2022-01-21: 30 mg via INTRAVENOUS
  Administered 2022-01-21: 50 mg via INTRAVENOUS

## 2022-01-21 MED ORDER — FLEET ENEMA 7-19 GM/118ML RE ENEM: 1.0000 | ENEMA | Freq: Once | RECTAL | Status: DC

## 2022-01-21 MED ORDER — LIDOCAINE HCL (PF) 2 % IJ SOLN
INTRAMUSCULAR | Status: AC
  Filled 2022-01-21: qty 5

## 2022-01-21 MED ORDER — PROPOFOL 10 MG/ML IV BOLUS
INTRAVENOUS | Status: AC
  Filled 2022-01-21: qty 20

## 2022-01-21 MED ORDER — SODIUM CHLORIDE FLUSH 0.9 % IV SOLN
INTRAVENOUS | Status: DC | PRN
  Administered 2022-01-21: 10 mL

## 2022-01-21 MED ORDER — KETOROLAC TROMETHAMINE 30 MG/ML IJ SOLN: 15.0000 mg | Freq: Once | INTRAMUSCULAR | Status: DC | PRN

## 2022-01-21 MED ORDER — SUGAMMADEX SODIUM 200 MG/2ML IV SOLN
INTRAVENOUS | Status: DC | PRN
  Administered 2022-01-21: 200 mg via INTRAVENOUS

## 2022-01-21 MED ORDER — LACTATED RINGERS IV SOLN: INTRAVENOUS | Status: DC

## 2022-01-21 MED ORDER — HYDROMORPHONE HCL 1 MG/ML IJ SOLN
0.2500 mg | INTRAMUSCULAR | Status: DC | PRN
  Administered 2022-01-21 (×2): 0.25 mg via INTRAVENOUS

## 2022-01-21 MED ORDER — MIDAZOLAM HCL 2 MG/2ML IJ SOLN
INTRAMUSCULAR | Status: AC
  Filled 2022-01-21: qty 2

## 2022-01-21 MED ORDER — DEXAMETHASONE SODIUM PHOSPHATE 10 MG/ML IJ SOLN
INTRAMUSCULAR | Status: AC
  Filled 2022-01-21: qty 1

## 2022-01-21 MED ORDER — OXYCODONE HCL 5 MG PO TABS
5.0000 mg | ORAL_TABLET | Freq: Once | ORAL | Status: AC | PRN
  Administered 2022-01-21: 5 mg via ORAL

## 2022-01-21 MED ORDER — FENTANYL CITRATE (PF) 100 MCG/2ML IJ SOLN
INTRAMUSCULAR | Status: DC | PRN
  Administered 2022-01-21: 100 ug via INTRAVENOUS
  Administered 2022-01-21 (×2): 50 ug via INTRAVENOUS

## 2022-01-21 MED ORDER — OXYCODONE HCL 5 MG/5ML PO SOLN: 5.0000 mg | Freq: Once | ORAL | Status: AC | PRN

## 2022-01-21 MED ORDER — ONDANSETRON HCL 4 MG/2ML IJ SOLN
INTRAMUSCULAR | Status: AC
  Filled 2022-01-21: qty 2

## 2022-01-21 MED ORDER — CEFAZOLIN SODIUM-DEXTROSE 2-4 GM/100ML-% IV SOLN
INTRAVENOUS | Status: AC
  Filled 2022-01-21: qty 100

## 2022-01-21 MED ORDER — EPHEDRINE SULFATE-NACL 50-0.9 MG/10ML-% IV SOSY
PREFILLED_SYRINGE | INTRAVENOUS | Status: DC | PRN
  Administered 2022-01-21: 10 mg via INTRAVENOUS

## 2022-01-21 MED ORDER — IOHEXOL 300 MG/ML  SOLN
INTRAMUSCULAR | Status: DC | PRN
  Administered 2022-01-21: 10 mL

## 2022-01-21 MED ORDER — MIDAZOLAM HCL 2 MG/2ML IJ SOLN
INTRAMUSCULAR | Status: DC | PRN
Start: 2022-01-21 — End: 2022-01-21
  Administered 2022-01-21: 2 mg via INTRAVENOUS

## 2022-01-21 MED ORDER — ONDANSETRON HCL 4 MG/2ML IJ SOLN
INTRAMUSCULAR | Status: DC | PRN
  Administered 2022-01-21: 4 mg via INTRAVENOUS

## 2022-01-21 MED ORDER — PROPOFOL 10 MG/ML IV BOLUS
INTRAVENOUS | Status: DC | PRN
Start: 2022-01-21 — End: 2022-01-21
  Administered 2022-01-21: 150 mg via INTRAVENOUS

## 2022-01-21 SURGICAL SUPPLY — 44 items
BAG DRN RND TRDRP ANRFLXCHMBR (UROLOGICAL SUPPLIES) ×1
BAG URINE DRAIN 2000ML AR STRL (UROLOGICAL SUPPLIES) ×3 IMPLANT
BLADE CLIPPER SENSICLIP SURGIC (BLADE) ×3 IMPLANT
Bard Quicklink 1-125 seeds ×75 IMPLANT
CATH FOLEY 2WAY SLVR  5CC 16FR (CATHETERS) ×4
CATH FOLEY 2WAY SLVR 5CC 16FR (CATHETERS) ×4 IMPLANT
CATH ROBINSON RED A/P 16FR (CATHETERS) IMPLANT
CATH ROBINSON RED A/P 20FR (CATHETERS) ×3 IMPLANT
CLOTH BEACON ORANGE TIMEOUT ST (SAFETY) ×3 IMPLANT
COVER BACK TABLE 60X90IN (DRAPES) ×3 IMPLANT
COVER MAYO STAND STRL (DRAPES) ×3 IMPLANT
DRSG TEGADERM 4X4.75 (GAUZE/BANDAGES/DRESSINGS) ×5 IMPLANT
DRSG TEGADERM 8X12 (GAUZE/BANDAGES/DRESSINGS) ×6 IMPLANT
GAUZE SPONGE 4X4 12PLY STRL (GAUZE/BANDAGES/DRESSINGS) ×1 IMPLANT
GEL ULTRASOUND 20GR AQUASONIC (MISCELLANEOUS) ×3 IMPLANT
GLOVE SURG ENC MOIS LTX SZ6.5 (GLOVE) ×3 IMPLANT
GLOVE SURG ENC MOIS LTX SZ7.5 (GLOVE) ×3 IMPLANT
GLOVE SURG ORTHO LTX SZ8.5 (GLOVE) ×3 IMPLANT
GLOVE SURG UNDER POLY LF SZ6.5 (GLOVE) IMPLANT
GOWN STRL REUS W/TWL XL LVL3 (GOWN DISPOSABLE) ×3 IMPLANT
GRID BRACH TEMP 18GA 2.8X3X.75 (MISCELLANEOUS) ×3 IMPLANT
HOLDER FOLEY CATH W/STRAP (MISCELLANEOUS) ×3 IMPLANT
IMPL SPACEOAR VUE SYSTEM (Spacer) ×2 IMPLANT
IMPLANT SPACEOAR VUE SYSTEM (Spacer) ×2 IMPLANT
IV NS 1000ML (IV SOLUTION) ×2
IV NS 1000ML BAXH (IV SOLUTION) ×2 IMPLANT
KIT TURNOVER CYSTO (KITS) ×3 IMPLANT
NDL BRACHY 18G 5PK (NEEDLE) ×8 IMPLANT
NDL BRACHY 18G SINGLE (NEEDLE) IMPLANT
NDL PK MORGANSTERN STABILIZ (NEEDLE) ×2 IMPLANT
NEEDLE BRACHY 18G 5PK (NEEDLE) ×8 IMPLANT
NEEDLE BRACHY 18G SINGLE (NEEDLE) IMPLANT
NEEDLE PK MORGANSTERN STABILIZ (NEEDLE) ×2 IMPLANT
PACK CYSTO (CUSTOM PROCEDURE TRAY) ×3 IMPLANT
SHEATH ULTRASOUND LF (SHEATH) IMPLANT
SHEATH ULTRASOUND LTX NONSTRL (SHEATH) IMPLANT
SURGILUBE 2OZ TUBE FLIPTOP (MISCELLANEOUS) ×3 IMPLANT
SUT BONE WAX W31G (SUTURE) IMPLANT
SYR 10ML LL (SYRINGE) ×6 IMPLANT
SYR 20ML LL LF (SYRINGE) IMPLANT
TOWEL OR 17X26 10 PK STRL BLUE (TOWEL DISPOSABLE) ×3 IMPLANT
UNDERPAD 30X36 HEAVY ABSORB (UNDERPADS AND DIAPERS) ×6 IMPLANT
WATER STERILE IRR 500ML POUR (IV SOLUTION) ×3 IMPLANT
brachytherapy needle ×1 IMPLANT

## 2022-01-21 NOTE — Discharge Instructions (Addendum)
No acetaminophen/Tylenol until after 6:30pm today if needed for pain.    No ibuprofen, Advil, Aleve, Motrin, ketorolac, meloxicam, naproxen, or other NSAIDS until after 9:00pm today if needed for pain.     Post Anesthesia Home Care Instructions  Activity: Get plenty of rest for the remainder of the day. A responsible individual must stay with you for 24 hours following the procedure.  For the next 24 hours, DO NOT: -Drive a car -Paediatric nurse -Drink alcoholic beverages -Take any medication unless instructed by your physician -Make any legal decisions or sign important papers.  Meals: Start with liquid foods such as gelatin or soup. Progress to regular foods as tolerated. Avoid greasy, spicy, heavy foods. If nausea and/or vomiting occur, drink only clear liquids until the nausea and/or vomiting subsides. Call your physician if vomiting continues.  Special Instructions/Symptoms: Your throat may feel dry or sore from the anesthesia or the breathing tube placed in your throat during surgery. If this causes discomfort, gargle with warm salt water. The discomfort should disappear within 24 hours.         PROSTATE CANCER TREATMENT WITH RADIOACTIVE IODINE-125 SEED IMPLANT  This instruction sheet is intended to discuss implantation of Iodine-125 seeds as treatment for cancer of the prostate. It will explain in detail what you may expect from this treatment and what precautions are necessary as a result of the treatment. Iodine-125 emits a relatively low energy radiation. The radioactive seeds are surgically implanted directly into the prostate gland. Most of the radiation is contained within the prostate gland. A very small amount is present outside the body.The precautions that we ask you to take are to ensure that those around you are protected from unnecessary radiation. The principles of radiation safety that you need to understand are:  DISTANCE: The further a person is from the  radioactive implant the less radiation they will be receiving. The amount of radiation received falls off quite rapidly with distance. More specific guidelines are given in the table on the last page.  TIME: The amount of radiation a person is exposed to is directly proportional to the amount of time that is spent in close proximity to the radioactive implant. Very little radiation will be received during short periods. See the table on the last page for more specific guideline.  CHILDREN UNDER AGE 30 Children should not be allowed to sit on your lap or otherwise be in very close contact for more than a few minutes for the first 6-8 weeks following the implant. You may affectionately greet (hug/kiss) a child for a short period of time, but remember, the longer you are in close proximity with that child the more radiation they are being exposed to. At a distance of 6 feet there is no limit to the length of time you may spend together. See specific guidelines on the last page.  PREGNANT OR POSSIBLY PREGNANT WOMEN Pregnant women should avoid prolonged close physical contact with you for the first 6-8 weeks after implant. At a distance of 6 feet there is no limit to the length of time you may spend together. Pregnant women or possibly pregnant women can safely be in close contact with you for a limited period of time. See the last page for guidelines.  FAMILY RELATIONS You may sleep in the same bed as your partner (provided she is not pregnant or under the age of 56). Sexual intercourse, using a condom, may be resumed 2 weeks after the implant. Your semen may be  discolored, dark brown or black. This is normal and is the result of bleeding that may have occurred during the implant. After 3-4 weeks it will not be necessary to use a condom.  DAILY ACTIVITIES You may resume normal activities in a few days (example: work, shopping, church) without the risk of harmful radiation exposure to those around you  provided you keep in mind the time and distance precautions. Objects that you touch or item that you use do not become radioactive. Linens, clothing, tableware, and dishes may be used by other persons without special precautions. Your bodily wastes (urine and stool) are not radioactive.  SPECIAL PRECAUTIONS It is possible to lose implanted Iodine-125 seed(s) through urination. Although it is possible to pass seeds indefinitely, it is most likely to occur immediately after catheter removal. To prevent this from happening the catheter that was in place during the implant procedure is removed immediately after the implant and a cystoscopy procedure is performed. The process of removing the catheter and the cystoscopy procedure should dislodge and remove any seeds that are not firmly imbedded in the prostate tissue. However, you should watch for seeds if/when you remove your catheter at home. The seeds are silver colored and the size of a grain of rice. In the unlikely event that a seed is seen after urination, simply flush the seed down the toilet. The seed should not be handled with your fingers, not even with a glove or napkin. A spoon or tweezers can be used to pick up a seed. The Radiation Oncology department is open Monday - Friday from 8:00 am to 5:30 pm with a Radiation Oncologist on call at all times. He or she may be reached by calling [phone]. If you are to be hospitalized or if death should occur, your family should notify the Runner, broadcasting/film/video.  SIDE EFFECTS There are very few side effects associate with the implant procedure. Minor burning with urination, weak stream, hesitancy, intermittency, frequency, mild pain or feeling unable to pass your urine freely are common and usually stop in one to four months. If these symptoms are extremely uncomfortable, contact your physician.  RADIATION SAFETY GUIDELINES PROSTATE CANCER TREATMENT WITH RADIOACTIVE IODINE-125 SEED IMPLANT  The  following guidelines will limit exposure to less than naturally occurring background radiation.  PERSONS AGE 57-45 (if able to become pregnant)  FOR 8 WEEKS FOLLOWING IMPLANT  At a distance of 1 foot: limit time to less than 2 hours/week At a distance of 3 feet: limit time to 20 hours/week At a distance of 6 feet: no restrictions  AFTER 8 WEEKS No restrictions  CHILDREN UNDER AGE 57, PREGNANT WOMEN OR POSSIBLY PREGNANT WOMEN  FOR 8 WEEKS FOLLOWING IMPLANT At a distance of 1 foot: limit time to 10 minutes/week At a distance of 3 feet: limit time to 2 hours/week At a distance of 6 feet: no restrictions  AFTER 8 WEEKS No restrictions  PERSONS OVER THE AGE OF 45 AND DO NOT EXPECT TO HAVE ANY MORE CHILDREN No restrictions  Updated by [facility] in January 2020

## 2022-01-21 NOTE — Anesthesia Preprocedure Evaluation (Addendum)
Anesthesia Evaluation  Patient identified by MRN, date of birth, ID band Patient awake    Reviewed: Allergy & Precautions, NPO status , Patient's Chart, lab work & pertinent test results, reviewed documented beta blocker date and time   Airway Mallampati: III  TM Distance: >3 FB Neck ROM: Full    Dental  (+) Teeth Intact, Dental Advisory Given,    Pulmonary former smoker,    Pulmonary exam normal breath sounds clear to auscultation       Cardiovascular hypertension, Pt. on medications and Pt. on home beta blockers Normal cardiovascular exam Rhythm:Regular Rate:Normal     Neuro/Psych PSYCHIATRIC DISORDERS Anxiety negative neurological ROS     GI/Hepatic negative GI ROS, Neg liver ROS,   Endo/Other  negative endocrine ROS  Renal/GU negative Renal ROS   Prostate ca    Musculoskeletal negative musculoskeletal ROS (+)   Abdominal   Peds  Hematology negative hematology ROS (+)   Anesthesia Other Findings   Reproductive/Obstetrics negative OB ROS                            Anesthesia Physical Anesthesia Plan  ASA: 2  Anesthesia Plan: General   Post-op Pain Management: Tylenol PO (pre-op) and Toradol IV (intra-op)   Induction: Intravenous  PONV Risk Score and Plan: 3 and Ondansetron, Dexamethasone, Midazolam and Treatment may vary due to age or medical condition  Airway Management Planned: Oral ETT  Additional Equipment: None  Intra-op Plan:   Post-operative Plan: Extubation in OR  Informed Consent: I have reviewed the patients History and Physical, chart, labs and discussed the procedure including the risks, benefits and alternatives for the proposed anesthesia with the patient or authorized representative who has indicated his/her understanding and acceptance.     Dental advisory given  Plan Discussed with: CRNA  Anesthesia Plan Comments:        Anesthesia Quick  Evaluation

## 2022-01-21 NOTE — Op Note (Signed)
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 Tyler Pita  Anesthesia: General  EBL: Minimal  Complications: None  Indication: John Wagner. is a 71 y.o. gentleman with clinically localized prostate cancer. After discussing management options for treatment, he elected to proceed with radiotherapy. He presents today for the above procedures. The potential risks, complications, alternative options, and expected recovery course have been discussed in detail with the patient and he has provided informed consent to proceed.  Description of procedure: The patient was taken to the operating room and general anesthesia was induced. He was administered preoperative antibiotics, placed in the dorsal lithotomy position, and prepped and draped in the usual sterile fashion. Next, intraoperative transrectal ultrasonography was utilized for real-time intraoperative planning by the radiation oncology team. Once the treatment plan was completed and the seed strands created, stranded iodine 125 radiation seeds were placed utilizing a brachytherapy perineal template. 58 radioactive iodine 125 seeds into the prostate through 19 catheter needles.  The brachytherapy template was then removed.  A site in the midline was selected on the perineum for placement of an 18 g needle with saline.  The needle was advanced above the rectum and below Denonvillier's fascia to the mid gland and confirmed to be in the midline on transverse imaging.  One cc of saline was injected confirming appropriate expansion of this space.  A total of 5 cc of saline was then injected to open  the space further bilaterally.  The saline syringe was then removed and the SpaceOAR hydrogel was injected with good distribution bilaterally. Position of the radiation seeds was confirmed on fluoroscopic imaging.  Flexible cystoscopy was then performed and no seeds were identified within the bladder.  No bladder tumors, stones, or other mucosal pathology was identified within the bladder. He tolerated the procedure well and without complications. He was able to be transferred to the recovery unit in satisfactory condition.  He was given a voiding trial in the PACU.

## 2022-01-21 NOTE — Transfer of Care (Signed)
Immediate Anesthesia Transfer of Care Note  Patient: John Wagner.  Procedure(s) Performed: RADIOACTIVE SEED IMPLANT/BRACHYTHERAPY IMPLANT (Prostate) SPACE OAR INSTILLATION (Prostate)  Patient Location: PACU  Anesthesia Type:General  Level of Consciousness: awake, alert  and oriented  Airway & Oxygen Therapy: Patient Spontanous Breathing and Patient connected to face mask oxygen  Post-op Assessment: Report given to RN and Post -op Vital signs reviewed and stable  Post vital signs: Reviewed and stable  Last Vitals:  Vitals Value Taken Time  BP 164/98 01/21/22 1519  Temp    Pulse 70 01/21/22 1520  Resp 15 01/21/22 1520  SpO2 99 % 01/21/22 1520  Vitals shown include unvalidated device data.  Last Pain:  Vitals:   01/21/22 1216  TempSrc: Oral  PainSc: 0-No pain      Patients Stated Pain Goal: 2 (68/12/75 1700)  Complications: No notable events documented.

## 2022-01-21 NOTE — Interval H&P Note (Signed)
History and Physical Interval Note:  01/21/2022 12:04 PM  John Wagner.  has presented today for surgery, with the diagnosis of PROSTATE CANCER.  The various methods of treatment have been discussed with the patient and family. After consideration of risks, benefits and other options for treatment, the patient has consented to  Procedure(s) with comments: RADIOACTIVE SEED IMPLANT/BRACHYTHERAPY IMPLANT (N/A) - 90 MINS SPACE OAR INSTILLATION (N/A) as a surgical intervention.  The patient's history has been reviewed, patient examined, no change in status, stable for surgery.  I have reviewed the patient's chart and labs.  Questions were answered to the patient's satisfaction.     [name]

## 2022-01-21 NOTE — Anesthesia Procedure Notes (Signed)
Procedure Name: Intubation Date/Time: 01/21/2022 1:54 PM Performed by: Genelle Bal, CRNA Pre-anesthesia Checklist: Patient identified, Emergency Drugs available, Suction available and Patient being monitored Patient Re-evaluated:Patient Re-evaluated prior to induction Oxygen Delivery Method: Circle system utilized Preoxygenation: Pre-oxygenation with 100% oxygen Induction Type: IV induction Ventilation: Mask ventilation without difficulty Laryngoscope Size: Miller and 2 Grade View: Grade I Tube type: Oral Tube size: 7.5 mm Number of attempts: 1 Airway Equipment and Method: Stylet and Oral airway Placement Confirmation: ETT inserted through vocal cords under direct vision, positive ETCO2 and breath sounds checked- equal and bilateral Secured at: 23 cm Tube secured with: Tape Dental Injury: Teeth and Oropharynx as per pre-operative assessment

## 2022-01-21 NOTE — Anesthesia Postprocedure Evaluation (Signed)
Anesthesia Post Note  Patient: John Wagner.  Procedure(s) Performed: RADIOACTIVE SEED IMPLANT/BRACHYTHERAPY IMPLANT (Prostate) SPACE OAR INSTILLATION (Prostate)     Patient location during evaluation: PACU Anesthesia Type: General Level of consciousness: awake and alert Pain management: pain level controlled Vital Signs Assessment: post-procedure vital signs reviewed and stable Respiratory status: spontaneous breathing, nonlabored ventilation, respiratory function stable and patient connected to nasal cannula oxygen Cardiovascular status: blood pressure returned to baseline and stable Postop Assessment: no apparent nausea or vomiting Anesthetic complications: no   No notable events documented.  Last Vitals:  Vitals:   01/21/22 1600 01/21/22 1639  BP: (!) 155/91 (!) 139/91  Pulse: 60 70  Resp: 20 14  Temp: (!) 36.3 C 36.8 C  SpO2: 98% 96%    Last Pain:  Vitals:   01/21/22 1639  TempSrc:   PainSc: [place]

## 2022-01-22 ENCOUNTER — Encounter (HOSPITAL_BASED_OUTPATIENT_CLINIC_OR_DEPARTMENT_OTHER): Payer: Self-pay | Admitting: Urology

## 2022-01-23 NOTE — Progress Notes (Signed)
°  Radiation Oncology         [phone]) [phone] ________________________________  Name: John Wagner. MRN: [id number]  Date: 01/23/2022  DOB: [birth year]       Prostate Seed Implant  UO:[id number], [name], MD  No ref. provider found  DIAGNOSIS:  71 y.o. gentleman with Stage T1c adenocarcinoma of the prostate with Gleason score of 3+4, and PSA of 12.8  Oncology History  Malignant neoplasm of prostate ([place])  10/11/2021 Cancer Staging   Staging form: Prostate, AJCC 8th Edition - Clinical stage from 10/11/2021: Stage IIB (cT1c, cN0, cM0, PSA: 12.8, Grade Group: 2) - Signed by [name], PA-C on 10/25/2021 Histopathologic type: Adenocarcinoma, NOS Stage prefix: Initial diagnosis Prostate specific antigen (PSA) range: 10 to 19 Gleason primary pattern: 3 Gleason secondary pattern: 4 Gleason score: 7 Histologic grading system: 5 grade system Number of biopsy cores examined: 17 Number of biopsy cores positive: 3 Location of positive needle core biopsies: One side    10/25/2021 Initial Diagnosis   Malignant neoplasm of prostate ([facility])     No diagnosis found.  PROCEDURE: Insertion of radioactive I-125 seeds into the prostate gland.  RADIATION DOSE: 145 Gy, definitive therapy.  TECHNIQUE: [name]. was brought to the operating room with the urologist. He was placed in the dorsolithotomy position. He was catheterized and a rectal tube was inserted. The perineum was shaved, prepped and draped. The ultrasound probe was then introduced into the rectum to see the prostate gland.  TREATMENT DEVICE: A needle grid was attached to the ultrasound probe stand and anchor needles were placed.  3D PLANNING: The prostate was imaged in 3D using a sagittal sweep of the prostate probe. These images were transferred to the planning computer. There, the prostate, urethra and rectum were defined on each axial reconstructed image. Then, the software created an optimized 3D plan and a few seed positions  were adjusted. The quality of the plan was reviewed using [name] information for the target and the following two organs at risk:  Urethra and Rectum.  Then the accepted plan was printed and handed off to the radiation therapist.  Under my supervision, the custom loading of the seeds and spacers was carried out and loaded into sealed vicryl sleeves.  These pre-loaded needles were then placed into the needle holder.[name]  PROSTATE VOLUME STUDY:  Using transrectal ultrasound the volume of the prostate was verified to be 66.3 cc.  SPECIAL TREATMENT PROCEDURE/SUPERVISION AND HANDLING: The pre-loaded needles were then delivered under sagittal guidance. A total of 19 needles were used to deposit 75 seeds in the prostate gland. The individual seed activity was 0.519 mCi.  SpaceOAR:  Yes  COMPLEX SIMULATION: At the end of the procedure, an anterior radiograph of the pelvis was obtained to document seed positioning and count. Cystoscopy was performed to check the urethra and bladder.  MICRODOSIMETRY: At the end of the procedure, the patient was emitting 0.116 mR/hr at 1 meter. Accordingly, he was considered safe for hospital discharge.  PLAN: The patient will return to the radiation oncology clinic for post implant CT dosimetry in three weeks.   ________________________________  [name], M.D.

## 2022-01-29 ENCOUNTER — Telehealth: Payer: Self-pay | Admitting: Cardiology

## 2022-01-29 NOTE — Telephone Encounter (Signed)
Called and spoke w/pt and stated that we are working on an appeal and to call us if they need a sample an dthe pt voiced understanding

## 2022-01-29 NOTE — Telephone Encounter (Signed)
Patient called stating his Repatha was denied, they tried to call us several time but couldn't get through to [place].  He said we can call for an appeal at [facility] at [phone].

## 2022-01-30 NOTE — Telephone Encounter (Signed)
Returned call to appeal dept ang gave clinical information by phone

## 2022-01-30 NOTE — Telephone Encounter (Signed)
John Wagner from [place] call to get clinical information for the appeal.  He can be reached at [phone]

## 2022-02-06 ENCOUNTER — Other Ambulatory Visit: Payer: Self-pay | Admitting: Family Medicine

## 2022-02-06 DIAGNOSIS — I10 Essential (primary) hypertension: Secondary | ICD-10-CM

## 2022-02-11 ENCOUNTER — Encounter: Payer: Self-pay | Admitting: Cardiology

## 2022-02-11 ENCOUNTER — Other Ambulatory Visit: Payer: Self-pay

## 2022-02-11 ENCOUNTER — Ambulatory Visit: Payer: PPO | Admitting: Cardiology

## 2022-02-11 ENCOUNTER — Telehealth: Payer: Self-pay | Admitting: *Deleted

## 2022-02-11 VITALS — BP 162/82 | HR 71 | Ht 66.5 in | Wt 177.2 lb

## 2022-02-11 DIAGNOSIS — I10 Essential (primary) hypertension: Secondary | ICD-10-CM | POA: Diagnosis not present

## 2022-02-11 DIAGNOSIS — E663 Overweight: Secondary | ICD-10-CM

## 2022-02-11 DIAGNOSIS — R7303 Prediabetes: Secondary | ICD-10-CM | POA: Diagnosis not present

## 2022-02-11 DIAGNOSIS — E785 Hyperlipidemia, unspecified: Secondary | ICD-10-CM

## 2022-02-11 NOTE — Telephone Encounter (Signed)
CALLED PATIENT TO REMIND  OF POST SEED APPTS. FOR 02-12-22, SPOKE WITH PATIENT AND HE IS AWARE OF THESE APPTS.

## 2022-02-11 NOTE — Patient Instructions (Signed)
Medication Instructions:  Your physician recommends that you continue on your current medications as directed. Please refer to the Current Medication list given to you today.  *If you need a refill on your cardiac medications before your next appointment, please call your pharmacy*   Lab Work: Your physician recommends that you return for lab work in:  TODAY: Lipids If you have labs (blood work) drawn today and your tests are completely normal, you will receive your results only by: [place] (if you have [place]) OR A paper copy in the mail If you have any lab test that is abnormal or we need to change your treatment, we will call you to review the results.   Testing/Procedures: None   Follow-Up: At [facility], you and your health needs are our priority.  As part of our continuing mission to provide you with exceptional heart care, we have created designated Provider Care Teams.  These Care Teams include your primary Cardiologist (physician) and Advanced Practice Providers (APPs -  Physician Assistants and Nurse Practitioners) who all work together to provide you with the care you need, when you need it.  We recommend signing up for the patient portal called "MyChart".  Sign up information is provided on this After Visit Summary.  MyChart is used to connect with patients for Virtual Visits (Telemedicine).  Patients are able to view lab/test results, encounter notes, upcoming appointments, etc.  Non-urgent messages can be sent to your provider as well.   To learn more about what you can do with MyChart, go to [email or web address].    Your next appointment:   1 year(s)  The format for your next appointment:   In Person  Provider:   Berniece Salines, DO     Other Instructions

## 2022-02-11 NOTE — Progress Notes (Signed)
Cardiology Office Note:    Date:  02/11/2022   ID:  John Quince., DOB 1951/07/29, MRN [id number]  PCP:  John Mclean, MD  Cardiologist:  John Salines, DO  Electrophysiologist:  None   Referring MD: John Mclean, MD   " I am doing well"  History of Present Illness:    John Wagner. is a 71 y.o. male with a hx of hypertension, prediabetes, hyperlipidemia, anxiety and BPH here today for follow-up visit.  First saw the patient on August 08, 2021 at that time he presented because he had had a presyncope episode.  During his visit we review his ZIO monitor and carotid Dopplers which were done by his PCP.  Echo was ordered.  We also talked about his hyperlipidemia I recommended based on his ASCVD score to be seen in our lipid clinic for start of PCSK9 inhibitors.  In the interim he was seen at our lipid clinic he was started on PCSK9 inhibitors and he continues to take this medication. He recently had a procedure with radioactive seed implantation brachytherapy implant for elevated PCA with prostate cancer.  He recently recovered from questionable flu versus COVID.  No other complaints at this time.   Past Medical History:  Diagnosis Date   Anxiety    Elevated PSA    Seeing Dr. Para March   Hyperlipidemia    Hypertension    Pre-syncope    Psoriasis     Past Surgical History:  Procedure Laterality Date   APPENDECTOMY     COLONOSCOPY     RADIOACTIVE SEED IMPLANT N/A 01/21/2022   Procedure: RADIOACTIVE SEED IMPLANT/BRACHYTHERAPY IMPLANT;  Surgeon: John Haggard, MD;  Location: [facility];  Service: Urology;  Laterality: N/A;  90 MINS   SPACE OAR INSTILLATION N/A 01/21/2022   Procedure: SPACE OAR INSTILLATION;  Surgeon: John Haggard, MD;  Location: [facility];  Service: Urology;  Laterality: N/A;    Current Medications: Current Meds  Medication Sig   acyclovir (ZOVIRAX) 400 MG tablet TAKE 1 TABLET BY MOUTH THREE TIMES DAILY AS  NEEDED FOR OUTBREAKS   ALPRAZolam (XANAX) 1 MG tablet TAKE 1/2 TABLET BY MOUTH AT NOON AND TAKE 1/2 TABLET BY MOUTH EVERY NIGHT AT BEDTIME. MAY TAKE A WHOLE TABLET IF NEEDED ON OCCASION   betamethasone valerate lotion (VALISONE) 0.1 % Apply 1 application topically 2 (two) times daily. Reported on 01/23/2016   Evolocumab (REPATHA SURECLICK) 242 MG/ML SOAJ Inject 140 mg into the skin every 14 (fourteen) days.   hydrOXYzine (ATARAX/VISTARIL) 25 MG tablet Take 1 tablet (25 mg total) by mouth every 8 (eight) hours as needed for itching.   ibuprofen (ADVIL) 400 MG tablet Take 400 mg by mouth as needed for mild pain or headache.   lisinopril (ZESTRIL) 5 MG tablet TAKE ONE TABLET BY MOUTH DAILY   propranolol (INDERAL) 10 MG tablet Use twice a day as needed for performance anxiety   tadalafil (CIALIS) 20 MG tablet Take 1 tablet (20 mg total) by mouth daily as needed for erectile dysfunction.   traMADol (ULTRAM) 50 MG tablet Take 1 tablet (50 mg total) by mouth every 8 (eight) hours as needed.     Allergies:   Buspar [buspirone] and Statins   Social History   Socioeconomic History   Marital status: Single    Spouse name: Not on file   Number of children: Not on file   Years of education: Not on file   Highest education level: Not  on file  Occupational History   Occupation: picture frame/artist    Employer: [employer]  Tobacco Use   Smoking status: Former   Smokeless tobacco: Never  Substance and Sexual Activity   Alcohol use: Yes    Alcohol/week: 7.0 standard drinks    Types: 7 Glasses of wine per week   Drug use: No   Sexual activity: Yes  Other Topics Concern   Not on file  Social History Narrative   Not on file   Social Determinants of Health   Financial Resource Strain: Low Risk    Difficulty of Paying Living Expenses: Not hard at all  Food Insecurity: No Food Insecurity   Worried About Charity fundraiser in the Last Year: Never true   [place] in the Last Year: Never  true  Transportation Needs: No Transportation Needs   Lack of Transportation (Medical): No   Lack of Transportation (Non-Medical): No  Physical Activity: Inactive   Days of Exercise per Week: 0 days   Minutes of Exercise per Session: 0 min  Stress: No Stress Concern Present   Feeling of Stress : Not at all  Social Connections: Not on file     Family History: The patient's family history includes Cancer in his father; Heart disease in his mother; Hypertension in his mother.  ROS:   Review of Systems  Constitution: Negative for decreased appetite, fever and weight gain.  HENT: Negative for congestion, ear discharge, hoarse voice and sore throat.   Eyes: Negative for discharge, redness, vision loss in right eye and visual halos.  Cardiovascular: Negative for chest pain, dyspnea on exertion, leg swelling, orthopnea and palpitations.  Respiratory: Negative for cough, hemoptysis, shortness of breath and snoring.   Endocrine: Negative for heat intolerance and polyphagia.  Hematologic/Lymphatic: Negative for bleeding problem. Does not bruise/bleed easily.  Skin: Negative for flushing, nail changes, rash and suspicious lesions.  Musculoskeletal: Negative for arthritis, joint pain, muscle cramps, myalgias, neck pain and stiffness.  Gastrointestinal: Negative for abdominal pain, bowel incontinence, diarrhea and excessive appetite.  Genitourinary: Negative for decreased libido, genital sores and incomplete emptying.  Neurological: Negative for brief paralysis, focal weakness, headaches and loss of balance.  Psychiatric/Behavioral: Negative for altered mental status, depression and suicidal ideas.  Allergic/Immunologic: Negative for HIV exposure and persistent infections.    EKGs/Labs/Other Studies Reviewed:    The following studies were reviewed today:   EKG: None today  US carotid bilateral FINDINGS: Criteria: Quantification of carotid stenosis is based on velocity parameters that  correlate the residual internal carotid diameter with NASCET-based stenosis levels, using the diameter of the distal internal carotid lumen as the denominator for stenosis measurement.   The following velocity measurements were obtained:   RIGHT   ICA: 68/30 cm/sec   CCA: 95/62 cm/sec   SYSTOLIC ICA/CCA RATIO:  1.1   ECA: 90 cm/sec   LEFT   ICA: 84/38 cm/sec   CCA: 13/08 cm/sec   SYSTOLIC ICA/CCA RATIO:  1.2   ECA: 57 cm/sec   RIGHT CAROTID ARTERY: Minor intimal thickening and trace bifurcation hypoechoic plaque formation. No hemodynamically significant right ICA stenosis, velocity elevation, or turbulent flow. Degree of narrowing less than 50%.   RIGHT VERTEBRAL ARTERY:  Normal antegrade flow   LEFT CAROTID ARTERY: Similar minor intimal thickening and trace bifurcation hypoechoic plaque formation. No hemodynamically significant left ICA stenosis, velocity elevation, or turbulent flow.   LEFT VERTEBRAL ARTERY:  Normal antegrade flow   IMPRESSION: Minor carotid intimal thickening  and trace bifurcation atherosclerosis. Negative for stenosis. Degree of narrowing less than 50% bilaterally by ultrasound criteria.   Patent antegrade vertebral flow bilaterally     Electronically Signed   By: Jerilynn Mages.  Shick M.D.   On: 07/11/2021 13:24    Zio monitor Patch Wear Time:  5 days and 23 hours (2022-07-23T10:49:02-399 to 2022-07-29T10:31:43-0400)   Patient had a min HR of 48 bpm, max HR of 132 bpm, and avg HR of 75 bpm. Predominant underlying rhythm was Sinus Rhythm. Isolated SVEs were rare (<1.0%), SVE Couplets were rare (<1.0%), and SVE Triplets were rare (<1.0%). Isolated VEs were rare (<1.0%),  VE Couplets were rare (<1.0%), and no VE Triplets were present.    Summary: Sinus rhythm with rare premature atrial contractions and rare premature ventricular contractions  TTE 07/2021 IMPRESSIONS   1. Left ventricular ejection fraction, by estimation, is 55 to 60%. The left  ventricle has normal function. The left ventricle has no regional wall motion abnormalities. Left ventricular diastolic parameters are consistent with Grade I diastolic  dysfunction (impaired relaxation).   2. Right ventricular systolic function is normal. The right ventricular size is normal.   3. The mitral valve is normal in structure. Mild mitral valve regurgitation. No evidence of mitral stenosis.   4. The aortic valve is normal in structure. Aortic valve regurgitation is mild. No aortic stenosis is present.   5. Aneurysm of the ascending aorta, measuring 39 mm.   6. The inferior vena cava is normal in size with greater than 50%  respiratory variability, suggesting right atrial pressure of 3 mmHg.   FINDINGS   Left Ventricle: Left ventricular ejection fraction, by estimation, is 55  to 60%. The left ventricle has normal function. The left ventricle has no  regional wall motion abnormalities. The left ventricular internal cavity  size was normal in size. There is   borderline left ventricular hypertrophy. Left ventricular diastolic  parameters are consistent with Grade I diastolic dysfunction (impaired  relaxation).   Right Ventricle: The right ventricular size is normal. No increase in  right ventricular wall thickness. Right ventricular systolic function is  normal.   Left Atrium: Left atrial size was normal in size.   Right Atrium: Right atrial size was normal in size.   Pericardium: There is no evidence of pericardial effusion.   Mitral Valve: The mitral valve is normal in structure. Mild mitral valve  regurgitation. No evidence of mitral valve stenosis.   Tricuspid Valve: The tricuspid valve is normal in structure. Tricuspid  valve regurgitation is mild . No evidence of tricuspid stenosis.   Aortic Valve: The aortic valve is normal in structure. Aortic valve  regurgitation is mild. Aortic regurgitation PHT measures 564 msec. No  aortic stenosis is present. Aortic valve  mean gradient measures 3.0 mmHg.  Aortic valve peak gradient measures 6.1  mmHg. Aortic valve area, by VTI measures 2.71 cm.   Pulmonic Valve: The pulmonic valve was normal in structure. Pulmonic valve  regurgitation is not visualized. No evidence of pulmonic stenosis.   Aorta: The aortic root is normal in size and structure. There is an  aneurysm involving the ascending aorta measuring 39 mm.   Venous: The inferior vena cava is normal in size with greater than 50%  respiratory variability, suggesting right atrial pressure of 3 mmHg.   IAS/Shunts: No atrial level shunt detected by color flow Doppler.     Recent Labs: 07/10/2021: TSH 1.51 01/21/2022: ALT 17; BUN 13; Creatinine, Ser 0.63; Hemoglobin 15.9; Platelets  486; Potassium 4.1; Sodium 141  Recent Lipid Panel    Component Value Date/Time   CHOL 197 08/06/2020 1004   TRIG 109.0 08/06/2020 1004   HDL 44.40 08/06/2020 1004   CHOLHDL 4 08/06/2020 1004   VLDL 21.8 08/06/2020 1004   LDLCALC 131 (H) 08/06/2020 1004    Physical Exam:    VS:  BP (!) 162/82    Pulse 71    Ht 5' 6.5" (1.689 m)    Wt 177 lb 3.2 oz (80.4 kg)    SpO2 99%    BMI 28.17 kg/m     Wt Readings from Last 3 Encounters:  02/11/22 177 lb 3.2 oz (80.4 kg)  01/21/22 171 lb 14.4 oz (78 kg)  12/19/21 180 lb 2 oz (81.7 kg)     GEN: Well nourished, well developed in no acute distress HEENT: Normal NECK: No JVD; No carotid bruits LYMPHATICS: No lymphadenopathy CARDIAC: S1S2 noted,RRR, no murmurs, rubs, gallops RESPIRATORY:  Clear to auscultation without rales, wheezing or rhonchi  ABDOMEN: Soft, non-tender, non-distended, +bowel sounds, no guarding. EXTREMITIES: No edema, No cyanosis, no clubbing MUSCULOSKELETAL:  No deformity  SKIN: Warm and dry NEUROLOGIC:  Alert and oriented x 3, non-focal PSYCHIATRIC:  Normal affect, good insight  ASSESSMENT:    1. Hyperlipidemia, unspecified hyperlipidemia type   2. Prediabetes   3. Hypertension, unspecified type    4. Overweight (BMI 25.0-29.9)    PLAN:    Blood pressure is elevated in the office today.  This appears to be an isolated reading as he has had controlled blood pressure in the past.  We will monitor this closely.  If needed we will increase his antihypertensive medication.  For now continue the lisinopril 5 mg daily.  He plans to take his third dose of Repatha next Thursday.  We will get blood work today.  The patient understands the need to lose weight with diet and exercise. We have discussed specific strategies for this.  The patient is in agreement with the above plan. The patient left the office in stable condition.  The patient will follow up in 1 year.   Medication Adjustments/Labs and Tests Ordered: Current medicines are reviewed at length with the patient today.  Concerns regarding medicines are outlined above.  Orders Placed This Encounter  Procedures   Lipid panel   No orders of the defined types were placed in this encounter.   Patient Instructions  Medication Instructions:  Your physician recommends that you continue on your current medications as directed. Please refer to the Current Medication list given to you today.  *If you need a refill on your cardiac medications before your next appointment, please call your pharmacy*   Lab Work: Your physician recommends that you return for lab work in:  TODAY: Lipids If you have labs (blood work) drawn today and your tests are completely normal, you will receive your results only by: [place] (if you have [place]) OR A paper copy in the mail If you have any lab test that is abnormal or we need to change your treatment, we will call you to review the results.   Testing/Procedures: None   Follow-Up: At [facility], you and your health needs are our priority.  As part of our continuing mission to provide you with exceptional heart care, we have created designated Provider Care Teams.  These Care Teams include  your primary Cardiologist (physician) and Advanced Practice Providers (APPs -  Physician Assistants and Nurse Practitioners) who all work together  to provide you with the care you need, when you need it.  We recommend signing up for the patient portal called "MyChart".  Sign up information is provided on this After Visit Summary.  MyChart is used to connect with patients for Virtual Visits (Telemedicine).  Patients are able to view lab/test results, encounter notes, upcoming appointments, etc.  Non-urgent messages can be sent to your provider as well.   To learn more about what you can do with MyChart, go to [email or web address].    Your next appointment:   1 year(s)  The format for your next appointment:   In Person  Provider:   Berniece Salines, DO     Other Instructions     Adopting a Healthy Lifestyle.  Know what a healthy weight is for you (roughly BMI <25) and aim to maintain this   Aim for 7+ servings of fruits and vegetables daily   65-80+ fluid ounces of water or unsweet tea for healthy kidneys   Limit to max 1 drink of alcohol per day; avoid smoking/tobacco   Limit animal fats in diet for cholesterol and heart health - choose grass fed whenever available   Avoid highly processed foods, and foods high in saturated/trans fats   Aim for low stress - take time to unwind and care for your mental health   Aim for 150 min of moderate intensity exercise weekly for heart health, and weights twice weekly for bone health   Aim for 7-9 hours of sleep daily   When it comes to diets, agreement about the perfect plan isnt easy to find, even among the experts. Experts at the [employer] developed an idea known as the Healthy Eating Plate. Just imagine a plate divided into logical, healthy portions.   The emphasis is on diet quality:   Load up on vegetables and fruits - one-half of your plate: Aim for color and variety, and remember that potatoes dont count.    Go for whole grains - one-quarter of your plate: Whole wheat, barley, wheat berries, quinoa, oats, brown rice, and foods made with them. If you want pasta, go with whole wheat pasta.   Protein power - one-quarter of your plate: Fish, chicken, beans, and nuts are all healthy, versatile protein sources. Limit red meat.   The diet, however, does go beyond the plate, offering a few other suggestions.   Use healthy plant oils, such as olive, canola, soy, corn, sunflower and peanut. Check the labels, and avoid partially hydrogenated oil, which have unhealthy trans fats.   If youre thirsty, drink water. Coffee and tea are good in moderation, but skip sugary drinks and limit milk and dairy products to one or two daily servings.   The type of carbohydrate in the diet is more important than the amount. Some sources of carbohydrates, such as vegetables, fruits, whole grains, and beans-are healthier than others.   Finally, stay active  Signed, John Salines, DO  02/11/2022 10:43 AM    [place]

## 2022-02-12 ENCOUNTER — Ambulatory Visit
Admission: RE | Admit: 2022-02-12 | Discharge: 2022-02-12 | Disposition: A | Discharge status: Home / Self Care | Payer: PPO | Source: Ambulatory Visit | Attending: Urology | Admitting: Urology

## 2022-02-12 ENCOUNTER — Encounter: Payer: Self-pay | Admitting: Urology

## 2022-02-12 ENCOUNTER — Ambulatory Visit
Admission: RE | Admit: 2022-02-12 | Discharge: 2022-02-12 | Disposition: A | Discharge status: Home / Self Care | Payer: PPO | Source: Ambulatory Visit | Attending: Radiation Oncology | Admitting: Radiation Oncology

## 2022-02-12 VITALS — BP 135/95 | HR 75 | Temp 98.0°F | Resp 18 | Ht 66.5 in | Wt 178.4 lb

## 2022-02-12 DIAGNOSIS — R350 Frequency of micturition: Secondary | ICD-10-CM | POA: Insufficient documentation

## 2022-02-12 DIAGNOSIS — Z79899 Other long term (current) drug therapy: Secondary | ICD-10-CM | POA: Insufficient documentation

## 2022-02-12 DIAGNOSIS — C61 Malignant neoplasm of prostate: Secondary | ICD-10-CM

## 2022-02-12 DIAGNOSIS — R3911 Hesitancy of micturition: Secondary | ICD-10-CM | POA: Insufficient documentation

## 2022-02-12 LAB — LIPID PANEL
Chol/HDL Ratio: 2.4 ratio (ref 0.0–5.0)
Cholesterol, Total: 131 mg/dL (ref 100–199)
HDL: 54 mg/dL
LDL Chol Calc (NIH): 60 mg/dL (ref 0–99)
Triglycerides: 91 mg/dL (ref 0–149)
VLDL Cholesterol Cal: 17 mg/dL (ref 5–40)

## 2022-02-12 MED ORDER — TAMSULOSIN HCL 0.4 MG PO CAPS
0.4000 mg | ORAL_CAPSULE | Freq: Every day | ORAL | 2 refills | Status: AC
Start: 2022-02-12 — End: ?

## 2022-02-12 NOTE — Progress Notes (Signed)
°  Radiation Oncology         [phone]) [phone] ________________________________  Name: John Wagner. MRN: [id number]  Date: 02/12/2022  DOB: 09/30/1951  COMPLEX SIMULATION NOTE  NARRATIVE:  The patient was brought to the [facility] today following prostate seed implantation approximately one month ago.  Identity was confirmed.  All relevant records and images related to the planned course of therapy were reviewed.  Then, the patient was set-up supine.  CT images were obtained.  The CT images were loaded into the planning software.  Then the prostate and rectum were contoured.  Treatment planning then occurred.  The implanted iodine 125 seeds were identified by the physics staff for projection of radiation distribution  I have requested : 3D Simulation  I have requested a DVH of the following structures: Prostate and rectum.    ________________________________  [name], M.D.

## 2022-02-12 NOTE — Progress Notes (Signed)
Spoke w/ patient, verified identity, and begin nursing interview. Patient reports urinary difficulty w/ an I-PSS score of 30 (severe). No other issues reported at this time.  Meaningful use complete. No urinary management medications. Urology follow-up late March 2023 -per patient.  BP (!) 135/95 (BP Location: Left Arm, Patient Position: Sitting, Cuff Size: Large)    Pulse 75    Temp 98 F (36.7 C) (Temporal)    Resp 18    Ht 5' 6.5" (1.689 m)    Wt 178 lb 6 oz (80.9 kg)    SpO2 97%    BMI 28.36 kg/m

## 2022-02-12 NOTE — Progress Notes (Signed)
Radiation Oncology         [phone]) [phone] ________________________________  Name: [name] MRN: [id number]  Date: 02/12/2022  DOB: [birth year]  Post-Seed Follow-Up Visit Note  CC: Copland, [name], MD  Raynelle Bring, MD  Diagnosis:   71 y.o. gentleman with Stage T1c adenocarcinoma of the prostate with Gleason score of 3+4, and PSA of 12.8.    ICD-10-CM   1. Malignant neoplasm of prostate (HCC)  C61       Interval Since Last Radiation:  3 weeks 01/21/22:  Insertion of radioactive I-125 seeds into the prostate gland; 145 Gy, definitive therapy with placement of SpaceOAR gel.  Narrative:  The patient returns today for routine follow-up.  He is complaining of increased urinary frequency and urinary hesitation symptoms. He filled out a questionnaire regarding urinary function today providing and overall IPSS score of 30 characterizing his symptoms as severe with nocturia x5, frequency, weak flow of stream, dysuria, intermittency and hesitancy.  His pre-implant score was 6.  He is not currently taking any medications for management of LUTS.  He denies any abdominal pain or bowel symptoms. He has not noticed much if any change in his energy level and reports a healthy appetite. Overall, he is pleased with his progress to date.  ALLERGIES:  is allergic to buspar [buspirone] and statins.  Meds: Current Outpatient Medications  Medication Sig Dispense Refill   acyclovir (ZOVIRAX) 400 MG tablet TAKE 1 TABLET BY MOUTH THREE TIMES DAILY AS NEEDED FOR OUTBREAKS 45 tablet 3   ALPRAZolam (XANAX) 1 MG tablet TAKE 1/2 TABLET BY MOUTH AT NOON AND TAKE 1/2 TABLET BY MOUTH EVERY NIGHT AT BEDTIME. MAY TAKE A WHOLE TABLET IF NEEDED ON OCCASION 60 tablet 3   betamethasone valerate lotion (VALISONE) 0.1 % Apply 1 application topically 2 (two) times daily. Reported on 01/23/2016     Evolocumab (REPATHA SURECLICK) 829 MG/ML SOAJ Inject 140 mg into the skin every 14 (fourteen) days. 2 mL 11   hydrOXYzine  (ATARAX/VISTARIL) 25 MG tablet Take 1 tablet (25 mg total) by mouth every 8 (eight) hours as needed for itching. 21 tablet 0   ibuprofen (ADVIL) 400 MG tablet Take 400 mg by mouth as needed for mild pain or headache.     lisinopril (ZESTRIL) 5 MG tablet TAKE ONE TABLET BY MOUTH DAILY 90 tablet 3   propranolol (INDERAL) 10 MG tablet Use twice a day as needed for performance anxiety 30 tablet 3   tadalafil (CIALIS) 20 MG tablet Take 1 tablet (20 mg total) by mouth daily as needed for erectile dysfunction. 30 tablet 2   traMADol (ULTRAM) 50 MG tablet Take 1 tablet (50 mg total) by mouth every 8 (eight) hours as needed. 30 tablet 3   No current facility-administered medications for this encounter.    Physical Findings: In general this is a well appearing Caucasian male in no acute distress. He's alert and oriented x4 and appropriate throughout the examination. Cardiopulmonary assessment is negative for acute distress and he exhibits normal effort.   Lab Findings: Lab Results  Component Value Date   WBC 6.5 01/21/2022   HGB 15.9 01/21/2022   HCT 47.8 01/21/2022   MCV 86.9 01/21/2022   PLT 486 (H) 01/21/2022    Radiographic Findings:  Patient underwent CT imaging in our clinic for post implant dosimetry. The CT will be reviewed by Dr. Tammi Klippel to confirm there is an adequate distribution of radioactive seeds throughout the prostate gland and ensure that there are no  seeds in or near the rectum.  We suspect the final radiation plan and dosimetry will show appropriate coverage of the prostate gland. He understands that we will call and inform him of any unexpected findings on further review of his imaging and dosimetry.  Impression/Plan: 71 y.o. gentleman with Stage T1c adenocarcinoma of the prostate with Gleason score of 3+4, and PSA of 12.8. The patient is recovering from the effects of radiation. His urinary symptoms should gradually improve over the next 4-6 months. We talked about this today.  He is encouraged by his improvement already and is otherwise pleased with his outcome. I am sending a prescription for Flomax to start taking now and he will also pick up some OTC AZO at the pharmacy to help with the dysuria. We also talked about long-term follow-up for prostate cancer following seed implant. He understands that ongoing PSA determinations and digital rectal exams will help perform surveillance to rule out disease recurrence. He has a follow up appointment scheduled with Dr. [place] Cage in March 2023. He understands what to expect with his PSA measures. Patient was also educated today about some of the long-term effects from radiation including a small risk for rectal bleeding and possibly erectile dysfunction. We talked about some of the general management approaches to these potential complications. However, I did encourage the patient to contact our office or return at any point if he has questions or concerns related to his previous radiation and prostate cancer.    Nicholos Johns, PA-C

## 2022-02-17 ENCOUNTER — Encounter: Payer: Self-pay | Admitting: Radiation Oncology

## 2022-02-17 DIAGNOSIS — C61 Malignant neoplasm of prostate: Secondary | ICD-10-CM | POA: Diagnosis not present

## 2022-02-19 IMAGING — US US CAROTID DUPLEX BILAT
1 series · 13 of 24 positions shown · non-contrast
Comparison: None.

CLINICAL DATA: Presyncopal episode

EXAM:
BILATERAL CAROTID DUPLEX ULTRASOUND
TECHNIQUE: Gray scale imaging, color Doppler and duplex ultrasound were
performed of bilateral carotid and vertebral arteries in the neck.

[Series 1: us carotid duplex bilat · 13 of 45 slices shown]
[im 1/45]
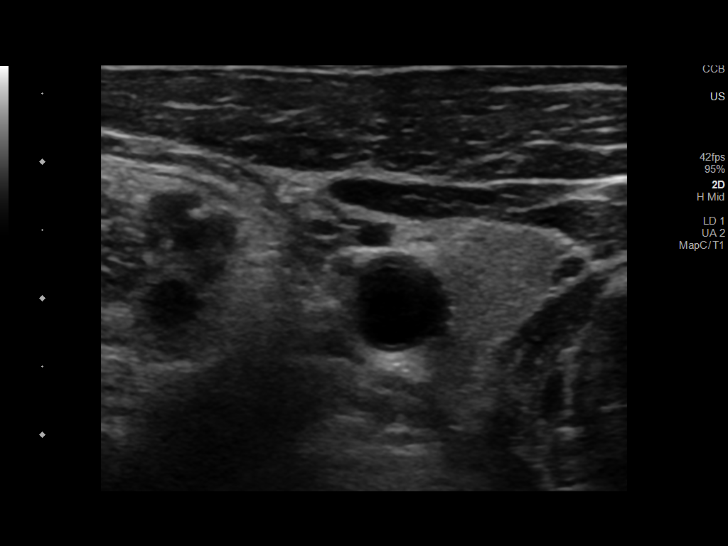
[im 4/45]
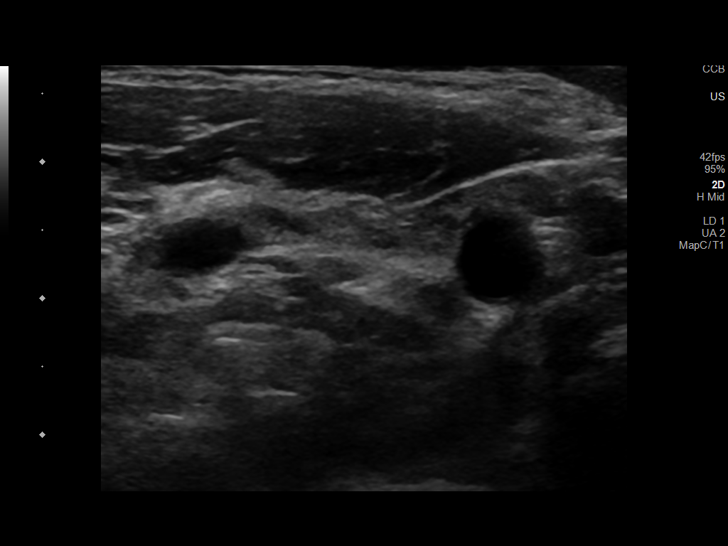
[im 8/45]
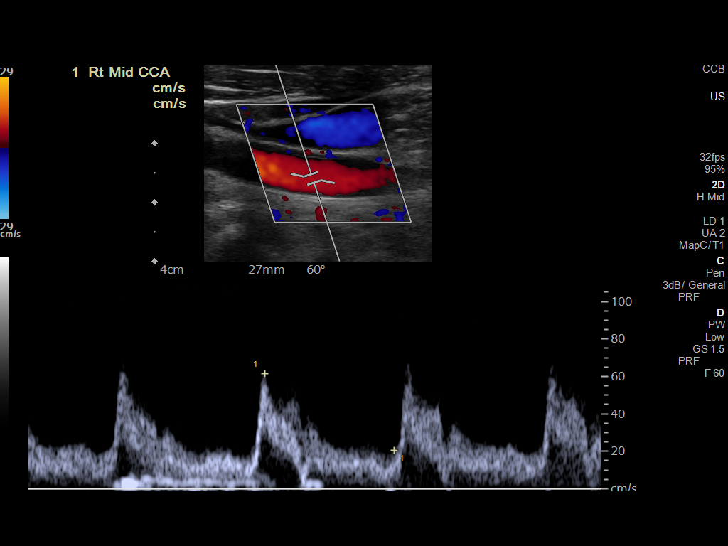
[im 12/45]
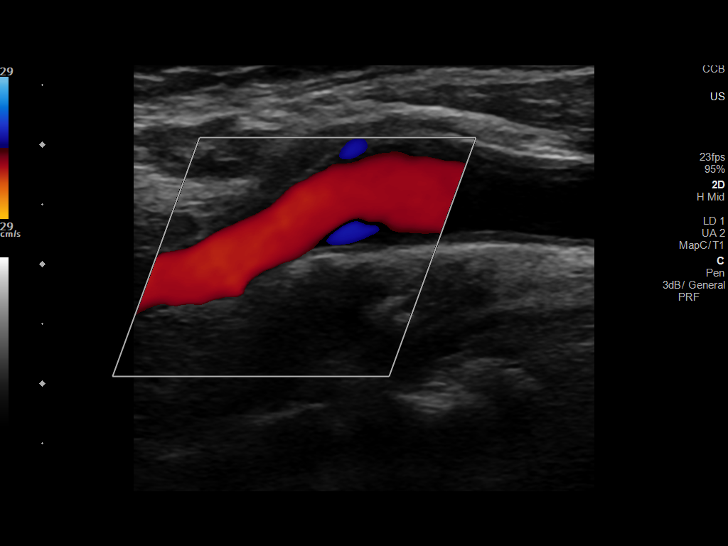
[im 16/45]
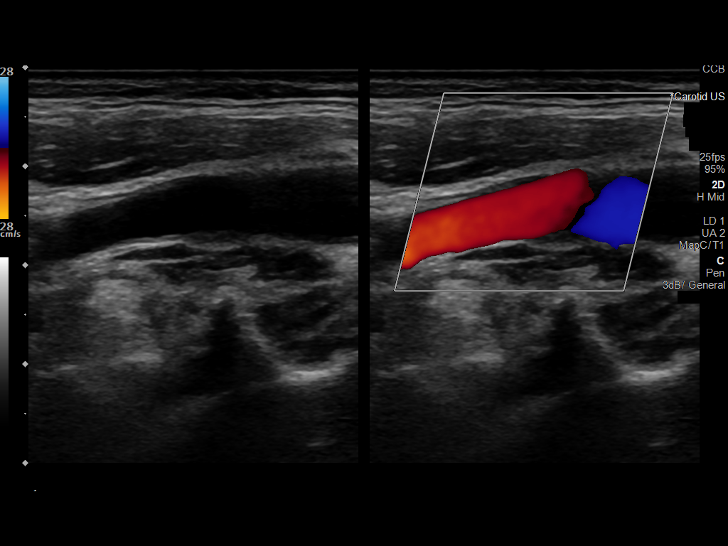
[im 20/45]
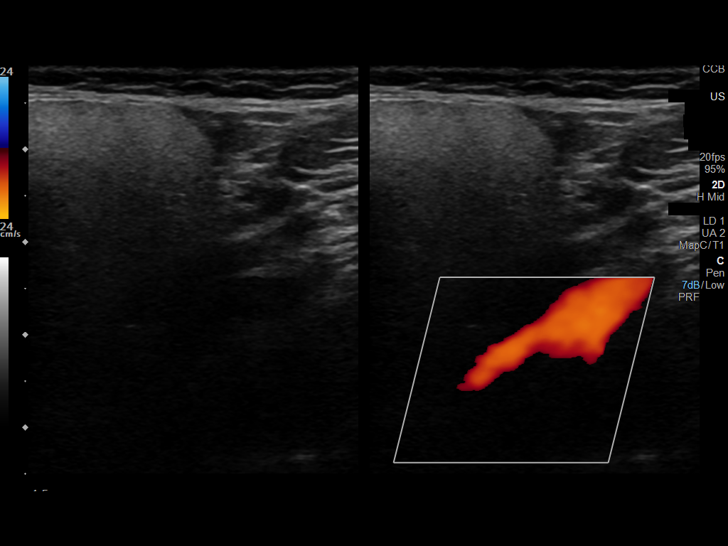
[im 23/45]
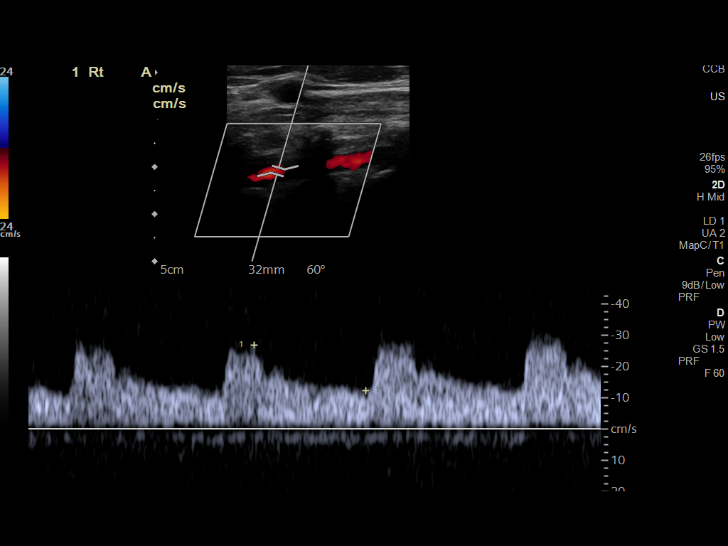
[im 25/45]
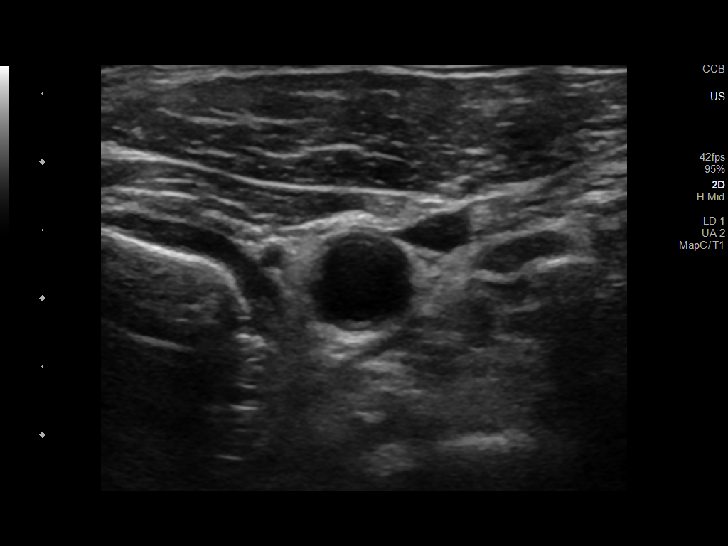
[im 29/45]
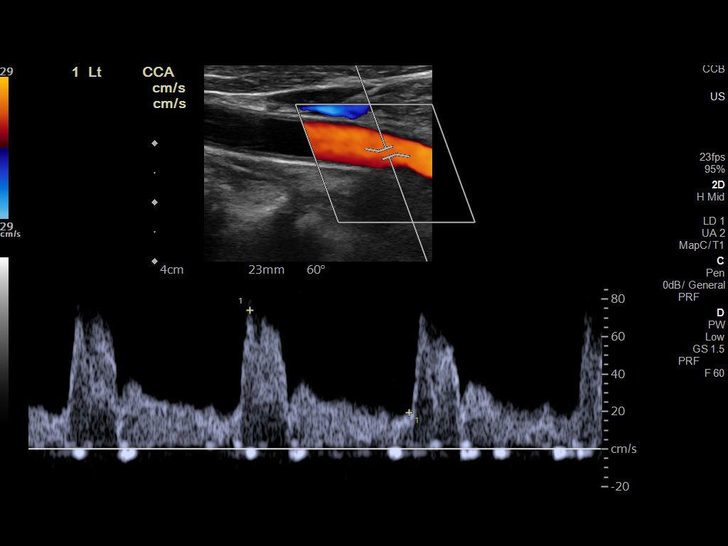
[im 33/45]
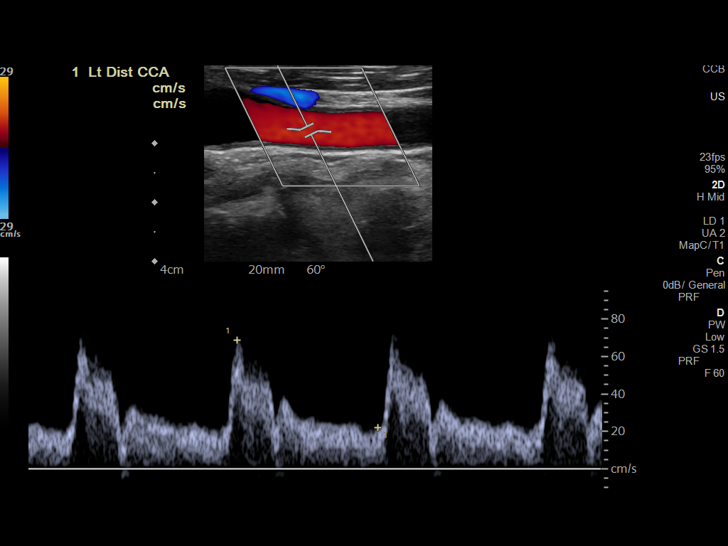
[im 37/45]
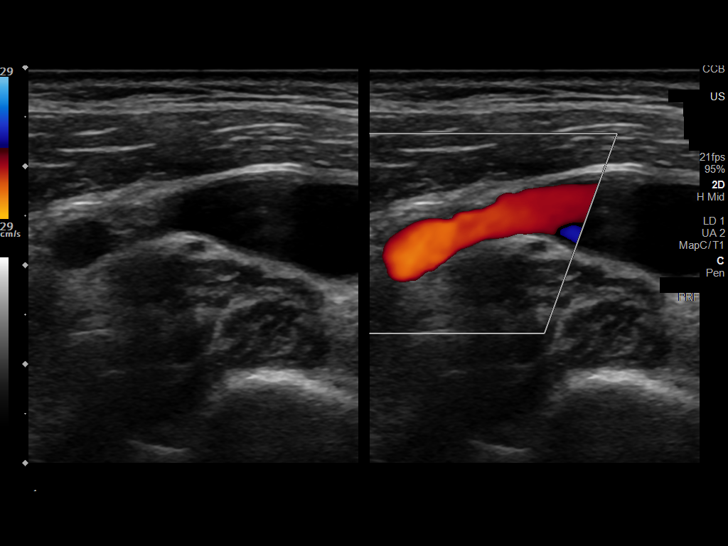
[im 41/45]
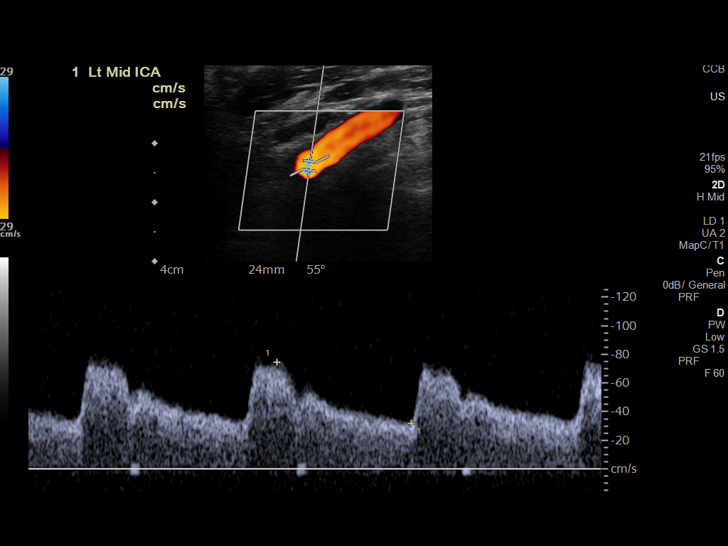
[im 45/45]
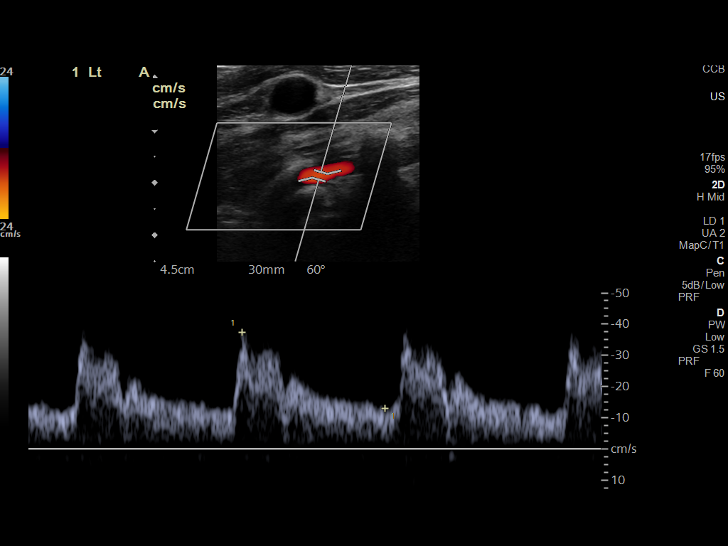

[13 of 24 positions shown; findings below may reference images not displayed]

FINDINGS: Criteria: Quantification of carotid stenosis is based on velocity
parameters that correlate the residual internal carotid diameter
with NASCET-based stenosis levels, using the diameter of the distal
internal carotid lumen as the denominator for stenosis measurement.

The following velocity measurements were obtained:

RIGHT

ICA: 68/30 cm/sec

CCA: 62/21 cm/sec

SYSTOLIC ICA/CCA RATIO:

ECA: 90 cm/sec

LEFT

ICA: 84/38 cm/sec

CCA: 71/24 cm/sec

SYSTOLIC ICA/CCA RATIO:

ECA: 57 cm/sec

RIGHT CAROTID ARTERY: Minor intimal thickening and trace bifurcation
hypoechoic plaque formation. No hemodynamically significant right
ICA stenosis, velocity elevation, or turbulent flow. Degree of
narrowing less than 50%.

RIGHT VERTEBRAL ARTERY:  Normal antegrade flow

LEFT CAROTID ARTERY: Similar minor intimal thickening and trace
bifurcation hypoechoic plaque formation. No hemodynamically
significant left ICA stenosis, velocity elevation, or turbulent
flow.

LEFT VERTEBRAL ARTERY:  Normal antegrade flow
IMPRESSION: Minor carotid intimal thickening and trace bifurcation
atherosclerosis. Negative for stenosis. Degree of narrowing less
than 50% bilaterally by ultrasound criteria.

Patent antegrade vertebral flow bilaterally

## 2022-02-27 DIAGNOSIS — H2513 Age-related nuclear cataract, bilateral: Secondary | ICD-10-CM | POA: Diagnosis not present

## 2022-03-05 ENCOUNTER — Other Ambulatory Visit: Payer: Self-pay | Admitting: Family Medicine

## 2022-03-05 ENCOUNTER — Encounter: Payer: Self-pay | Admitting: Family Medicine

## 2022-03-05 DIAGNOSIS — G8929 Other chronic pain: Secondary | ICD-10-CM

## 2022-03-18 ENCOUNTER — Telehealth: Payer: Self-pay | Admitting: *Deleted

## 2022-03-25 ENCOUNTER — Telehealth: Payer: Self-pay | Admitting: *Deleted

## 2022-03-27 ENCOUNTER — Encounter: Payer: Self-pay | Admitting: *Deleted

## 2022-03-27 ENCOUNTER — Inpatient Hospital Stay: Payer: PPO | Attending: Adult Health | Admitting: *Deleted

## 2022-03-27 ENCOUNTER — Telehealth: Payer: Self-pay | Admitting: *Deleted

## 2022-03-27 DIAGNOSIS — C61 Malignant neoplasm of prostate: Secondary | ICD-10-CM

## 2022-03-27 DIAGNOSIS — M1712 Unilateral primary osteoarthritis, left knee: Secondary | ICD-10-CM | POA: Diagnosis not present

## 2022-03-27 NOTE — Progress Notes (Signed)
?  2 Identifiers used for this visit for verification purposes only. No vital signs taken as this was a telephone visit. Pt rates pain a 5/10 to lower genital area after urination. He sa He complains of fatigue and nausea. This has been going on intermittently for 3- 4  weeks. Pt did mention this to Ashlyn ,PA when he had appt 2 months ago. Pt does have some urine hesitancy. He said he usually get up at night to bathroom once. He has some episodes of bowel leakage when passing gas. Pt is unable to do much exercise b/c of his knee pain. He recently received a steroid injection for pain. Pt rates fatigue 6/10. Overall, he feels like he is getting over side effects from treatment. SCP reviewed and completed. ?

## 2022-04-07 DIAGNOSIS — C61 Malignant neoplasm of prostate: Secondary | ICD-10-CM | POA: Diagnosis not present

## 2022-04-14 DIAGNOSIS — R3912 Poor urinary stream: Secondary | ICD-10-CM | POA: Diagnosis not present

## 2022-05-12 ENCOUNTER — Telehealth: Payer: Self-pay

## 2022-05-12 NOTE — Telephone Encounter (Signed)
Returned call to Mr. Sawdey about refill for Tamsulosin.  Left voicemail advised him that since he was no longer under the care of radiation team to call his urologist for any refills needed.

## 2022-06-26 DIAGNOSIS — M1712 Unilateral primary osteoarthritis, left knee: Secondary | ICD-10-CM | POA: Diagnosis not present

## 2022-07-01 ENCOUNTER — Encounter: Payer: Self-pay | Admitting: Family Medicine

## 2022-07-01 DIAGNOSIS — G8929 Other chronic pain: Secondary | ICD-10-CM

## 2022-07-01 DIAGNOSIS — F411 Generalized anxiety disorder: Secondary | ICD-10-CM

## 2022-07-01 MED ORDER — ALPRAZOLAM 1 MG PO TABS: ORAL_TABLET | ORAL | 3 refills | Status: DC

## 2022-07-01 MED ORDER — TRAMADOL HCL 50 MG PO TABS: 50.0000 mg | ORAL_TABLET | Freq: Three times a day (TID) | ORAL | 1 refills | Status: DC | PRN

## 2022-07-08 NOTE — Progress Notes (Signed)
[facility] [address] [place], [place] [address] [phone]  Date:  07/14/2022   Name:  John Wagner.   DOB:  [birth year]   MRN:  [id number]  PCP:  Darreld Mclean, MD    Chief Complaint: 6 month follow up (Concerns/ questions:1. Pt is getting knee replacement in the winter on L knee. 2. Psa went from 12 to 4. 3. Pt says the extra use of Tramadol came from the recent Prostate surgery. Urologist would not give another Rx, he had to use his own supply. //)   History of Present Illness:  John Wagner. is a 71 y.o. very pleasant male patient who presents with the following:  Patient seen today for periodic follow-up-  History of prediabetes, BPH, hypertension, hyperlipidemia, anxiety treated with xanax, joint pain, prostate cancer diagnosed last year  Most recent visit with myself was in January  His urologist is Dr. Francoise Schaumann Prostate cancer has been treated with radioactive seed implantation.  Pt notes his PSA is getting better already- they expect his PSA will be lower when he is seen next He had to use more tramadol around the time of his surgery - this is what he used for pain (the surgeon did not give him a separate prescription) so he went through it more quickly than usual He is not having much in the way of SE except for urinary hesitancy- he is using flomax    Seen by cardiology in February: 1. Hyperlipidemia, unspecified hyperlipidemia type   2. Prediabetes   3. Hypertension, unspecified type   4. Overweight (BMI 25.0-29.9)   Blood pressure is elevated in the office today.  This appears to be an isolated reading as he has had controlled blood pressure in the past.  We will monitor this closely.  If needed we will increase his antihypertensive medication.  For now continue the lisinopril 5 mg daily. He plans to take his third dose of Repatha next Thursday. We will get blood work today.    Shingrix- recommended  COVID booster; recommend that he do this prior to his trip to [place] up-to-date He will have his left knee replaced in January per his ortho at [facility] - Dr Zachery Dakins   He is using repatha and notes tolerating well  Lab Results  Component Value Date   HGBA1C 6.0 08/06/2020    Patient Active Problem List   Diagnosis Date Noted   Prediabetes 02/11/2022   Overweight (BMI 25.0-29.9) 02/11/2022   Malignant neoplasm of prostate ([place]) 10/25/2021   Elevated PSA    BPH (benign prostatic hyperplasia) 12/25/2014   Erectile dysfunction 03/10/2014   Anxiety 12/06/2012   Psoriasis 12/06/2012   Hypertension    Hyperlipidemia     Past Medical History:  Diagnosis Date   Anxiety    Elevated PSA    Seeing Dr. Para March   Hyperlipidemia    Hypertension    Pre-syncope    Psoriasis     Past Surgical History:  Procedure Laterality Date   APPENDECTOMY     COLONOSCOPY     RADIOACTIVE SEED IMPLANT N/A 01/21/2022   Procedure: RADIOACTIVE SEED IMPLANT/BRACHYTHERAPY IMPLANT;  Surgeon: [name], MD;  Location: [facility];  Service: Urology;  Laterality: N/A;  90 MINS   SPACE OAR INSTILLATION N/A 01/21/2022   Procedure: SPACE OAR INSTILLATION;  Surgeon: [name], MD;  Location: [facility];  Service:  Urology;  Laterality: N/A;    Social History   Tobacco Use   Smoking status: Former   Smokeless tobacco: Never  Substance Use Topics   Alcohol use: Yes    Alcohol/week: 7.0 standard drinks of alcohol    Types: 7 Glasses of wine per week   Drug use: No    Family History  Problem Relation Age of Onset   Cancer Father        prostate cancer   Hypertension Mother    Heart disease Mother     Allergies  Allergen Reactions   Buspar [Buspirone]     CNS   Statins     myalgias    Medication list has been reviewed and updated.  Current Outpatient Medications on File Prior to Visit  Medication Sig Dispense  Refill   acyclovir (ZOVIRAX) 400 MG tablet TAKE 1 TABLET BY MOUTH THREE TIMES DAILY AS NEEDED FOR OUTBREAKS 45 tablet 3   ALPRAZolam (XANAX) 1 MG tablet TAKE 1/2 TABLET BY MOUTH AT NOON AND TAKE 1/2 TABLET BY MOUTH EVERY NIGHT AT BEDTIME. MAY TAKE A WHOLE TABLET IF NEEDED ON OCCASION 60 tablet 3   betamethasone valerate lotion (VALISONE) 0.1 % Apply 1 application topically 2 (two) times daily. Reported on 01/23/2016     Evolocumab (REPATHA SURECLICK) 793 MG/ML SOAJ Inject 140 mg into the skin every 14 (fourteen) days. 2 mL 11   hydrOXYzine (ATARAX/VISTARIL) 25 MG tablet Take 1 tablet (25 mg total) by mouth every 8 (eight) hours as needed for itching. 21 tablet 0   ibuprofen (ADVIL) 400 MG tablet Take 400 mg by mouth as needed for mild pain or headache.     lisinopril (ZESTRIL) 5 MG tablet TAKE ONE TABLET BY MOUTH DAILY 90 tablet 3   propranolol (INDERAL) 10 MG tablet Use twice a day as needed for performance anxiety 30 tablet 3   tadalafil (CIALIS) 20 MG tablet Take 1 tablet (20 mg total) by mouth daily as needed for erectile dysfunction. 30 tablet 2   tamsulosin (FLOMAX) 0.4 MG CAPS capsule Take 1 capsule (0.4 mg total) by mouth daily after supper. 30 capsule 2   traMADol (ULTRAM) 50 MG tablet Take 1 tablet (50 mg total) by mouth every 8 (eight) hours as needed. 30 tablet 1   No current facility-administered medications on file prior to visit.    Review of Systems:  As per HPI- otherwise negative.   Physical Examination: Vitals:   07/14/22 0850  BP: 126/80  Pulse: 96  Resp: 18  Temp: 97.6 F (36.4 C)  SpO2: 98%   Vitals:   07/14/22 0850  Weight: 176 lb 6.4 oz (80 kg)  Height: 5' 6.5" (1.689 m)   Body mass index is 28.05 kg/m. Ideal Body Weight: Weight in (lb) to have BMI = 25: 156.9  GEN: no acute distress. Mild overweight, looks well  HEENT: Atraumatic, Normocephalic.  Ears and Nose: No external deformity. CV: RRR, No M/G/R. No JVD. No thrill. No extra heart sounds. PULM:  CTA B, no wheezes, crackles, rhonchi. No retractions. No resp. distress. No accessory muscle use. ABD: S, NT, ND, +BS. No rebound. No HSM. EXTR: No c/c/e PSYCH: Normally interactive. Conversant.  There is enlargement of the left knee joint, he favors that leg when walking  Assessment and Plan: Chronic knee pain, unspecified laterality - Plan: traMADol (ULTRAM) 50 MG tablet  Essential hypertension - Plan: CBC, Basic metabolic panel  Pre-diabetes  Prostate cancer (HCC)  GAD (generalized anxiety disorder)  Prediabetes - Plan: Hemoglobin A1c  Hyperlipidemia, unspecified hyperlipidemia type - Plan: Lipid panel  Chronic right shoulder pain - Plan: traMADol (ULTRAM) 50 MG tablet  Following up today as above.  Refill tramadol which he uses for chronic joint pain Blood pressure under good control, continue current regimen We will check lipids to see how he is responding to Repatha He is soon traveling to [place] on a long awaited backpiping trip-wishing the best of luck Will plan further follow- up pending labs.   Signed Lamar Blinks, MD  Received labs as follows, message to patient  Results for orders placed or performed in visit on 07/14/22  CBC  Result Value Ref Range   WBC 5.4 4.0 - 10.5 K/uL   RBC 4.88 4.22 - 5.81 Mil/uL   Platelets 327.0 150.0 - 400.0 K/uL   Hemoglobin 14.3 13.0 - 17.0 g/dL   HCT 42.6 39.0 - 52.0 %   MCV 87.4 78.0 - 100.0 fl   MCHC 33.5 30.0 - 36.0 g/dL   RDW 14.3 11.5 - 15.5 %  Lipid panel  Result Value Ref Range   Cholesterol 130 0 - 200 mg/dL   Triglycerides 57.0 0.0 - 149.0 mg/dL   HDL 53.80 >39.00 mg/dL   VLDL 11.4 0.0 - 40.0 mg/dL   LDL Cholesterol 65 0 - 99 mg/dL   Total CHOL/HDL Ratio 2    NonHDL 56.81   Basic metabolic panel  Result Value Ref Range   Sodium 138 135 - 145 mEq/L   Potassium 4.5 3.5 - 5.1 mEq/L   Chloride 103 96 - 112 mEq/L   CO2 28 19 - 32 mEq/L   Glucose, Bld 94 70 - 99 mg/dL   BUN 20 6 - 23 mg/dL   Creatinine,  Ser 1.08 0.40 - 1.50 mg/dL   GFR 70.08 >60.00 mL/min   Calcium 9.2 8.4 - 10.5 mg/dL  Hemoglobin A1c  Result Value Ref Range   Hgb A1c MFr Bld 6.3 4.6 - 6.5 %    Cholesterol continues to improve, continue Repatha

## 2022-07-14 ENCOUNTER — Other Ambulatory Visit (HOSPITAL_BASED_OUTPATIENT_CLINIC_OR_DEPARTMENT_OTHER): Payer: Self-pay

## 2022-07-14 ENCOUNTER — Ambulatory Visit (INDEPENDENT_AMBULATORY_CARE_PROVIDER_SITE_OTHER): Payer: PPO | Admitting: Family Medicine

## 2022-07-14 ENCOUNTER — Encounter: Payer: Self-pay | Admitting: Family Medicine

## 2022-07-14 ENCOUNTER — Ambulatory Visit: Payer: PPO | Attending: Internal Medicine

## 2022-07-14 VITALS — BP 126/80 | HR 96 | Temp 97.6°F | Resp 18 | Ht 66.5 in | Wt 176.4 lb

## 2022-07-14 DIAGNOSIS — C61 Malignant neoplasm of prostate: Secondary | ICD-10-CM

## 2022-07-14 DIAGNOSIS — F411 Generalized anxiety disorder: Secondary | ICD-10-CM | POA: Diagnosis not present

## 2022-07-14 DIAGNOSIS — I10 Essential (primary) hypertension: Secondary | ICD-10-CM

## 2022-07-14 DIAGNOSIS — E785 Hyperlipidemia, unspecified: Secondary | ICD-10-CM | POA: Diagnosis not present

## 2022-07-14 DIAGNOSIS — R7303 Prediabetes: Secondary | ICD-10-CM

## 2022-07-14 DIAGNOSIS — M25569 Pain in unspecified knee: Secondary | ICD-10-CM

## 2022-07-14 DIAGNOSIS — M25511 Pain in right shoulder: Secondary | ICD-10-CM | POA: Diagnosis not present

## 2022-07-14 DIAGNOSIS — Z23 Encounter for immunization: Secondary | ICD-10-CM

## 2022-07-14 DIAGNOSIS — G8929 Other chronic pain: Secondary | ICD-10-CM

## 2022-07-14 LAB — CBC
HCT: 42.6 % (ref 39.0–52.0)
Hemoglobin: 14.3 g/dL (ref 13.0–17.0)
MCHC: 33.5 g/dL (ref 30.0–36.0)
MCV: 87.4 fl (ref 78.0–100.0)
Platelets: 327 10*3/uL (ref 150.0–400.0)
RBC: 4.88 Mil/uL (ref 4.22–5.81)
RDW: 14.3 % (ref 11.5–15.5)
WBC: 5.4 10*3/uL (ref 4.0–10.5)

## 2022-07-14 LAB — LIPID PANEL
Cholesterol: 130 mg/dL (ref 0–200)
HDL: 53.8 mg/dL (ref >39.00)
LDL Cholesterol: 65 mg/dL (ref 0–99)
NonHDL: 76.24
Total CHOL/HDL Ratio: 2
Triglycerides: 57 mg/dL (ref 0.0–149.0)
VLDL: 11.4 mg/dL (ref 0.0–40.0)

## 2022-07-14 LAB — HEMOGLOBIN A1C: Hgb A1c MFr Bld: 6.3 % (ref 4.6–6.5)

## 2022-07-14 LAB — BASIC METABOLIC PANEL
BUN: 20 mg/dL (ref 6–23)
CO2: 28 mEq/L (ref 19–32)
Calcium: 9.2 mg/dL (ref 8.4–10.5)
Chloride: 103 mEq/L (ref 96–112)
Creatinine, Ser: 1.08 mg/dL (ref 0.40–1.50)
GFR: 70.08 mL/min (ref >60.00)
Glucose, Bld: 94 mg/dL (ref 70–99)
Potassium: 4.5 mEq/L (ref 3.5–5.1)
Sodium: 138 mEq/L (ref 135–145)

## 2022-07-14 MED ORDER — TRAMADOL HCL 50 MG PO TABS: 50.0000 mg | ORAL_TABLET | Freq: Three times a day (TID) | ORAL | 1 refills | Status: DC | PRN

## 2022-07-14 MED ORDER — PFIZER COVID-19 VAC BIVALENT 30 MCG/0.3ML IM SUSP
INTRAMUSCULAR | 0 refills | Status: DC
  Filled 2022-07-14: qty 0.3, 1d supply, fill #0

## 2022-07-14 NOTE — Progress Notes (Signed)
   [id number]  Name:  John Wagner.    MRN: [id number] DOB: 01-01-1951  07/14/2022  [name] was observed post Covid-19 immunization for 15 minutes without incident. He was provided with Vaccine Information Sheet and instruction to access the V-Safe system.   [name] was instructed to call 911 with any severe reactions post vaccine: Difficulty breathing  Swelling of face and throat  A fast heartbeat  A bad rash all over body  Dizziness and weakness   Immunizations Administered     Name Date Dose VIS Date Route   Pfizer Covid-19 Vaccine Bivalent Booster 07/14/2022  9:37 AM 0.3 mL 08/21/2021 Intramuscular   Manufacturer: [place]   Lot: [id number]   [place]: [phone]

## 2022-07-14 NOTE — Patient Instructions (Signed)
It was great to see you today- have a wonderful time in [place] I can't wait to hear all about your trip I would suggest getting a covid booster prior to travel, and the Shingrix vaccine at your convenience

## 2022-07-26 ENCOUNTER — Other Ambulatory Visit: Payer: Self-pay | Admitting: Family Medicine

## 2022-07-26 DIAGNOSIS — F418 Other specified anxiety disorders: Secondary | ICD-10-CM

## 2022-07-26 DIAGNOSIS — F411 Generalized anxiety disorder: Secondary | ICD-10-CM

## 2022-08-26 ENCOUNTER — Other Ambulatory Visit: Payer: Self-pay | Admitting: Cardiology

## 2022-09-01 ENCOUNTER — Telehealth: Payer: Self-pay | Admitting: Cardiology

## 2022-09-01 NOTE — Telephone Encounter (Signed)
Pt c/o medication issue:  1. Name of Medication: REPATHA SURECLICK 902 MG/ML SOAJ  2. How are you currently taking this medication (dosage and times per day)? As prescribed   3. Are you having a reaction (difficulty breathing--STAT)? No   4. What is your medication issue? Patient is calling stating he is needing his insurance contacted to have them cover this medication. He states this is something that has to be done every 6 months. Patient is requesting a callback to be notified when this has been resolved. Please advise.

## 2022-09-02 NOTE — Telephone Encounter (Signed)
Repatha approved through 09/03/23. Patient made aware.

## 2022-09-02 NOTE — Telephone Encounter (Signed)
PA submitted to [facility]. Key: [id number]

## 2022-09-11 ENCOUNTER — Telehealth: Payer: Self-pay | Admitting: Family Medicine

## 2022-09-11 NOTE — Telephone Encounter (Signed)
Left message for patient to call back and schedule Medicare Annual Wellness Visit (AWV).   Please offer to do virtually or by telephone.  Left office number and my jabber [phone].  Last AWV:08/29/2021  Please schedule at anytime with Nurse Health Advisor.

## 2022-09-16 DIAGNOSIS — L570 Actinic keratosis: Secondary | ICD-10-CM | POA: Diagnosis not present

## 2022-09-16 DIAGNOSIS — L821 Other seborrheic keratosis: Secondary | ICD-10-CM | POA: Diagnosis not present

## 2022-09-16 DIAGNOSIS — L814 Other melanin hyperpigmentation: Secondary | ICD-10-CM | POA: Diagnosis not present

## 2022-09-16 DIAGNOSIS — L400 Psoriasis vulgaris: Secondary | ICD-10-CM | POA: Diagnosis not present

## 2022-09-16 DIAGNOSIS — L820 Inflamed seborrheic keratosis: Secondary | ICD-10-CM | POA: Diagnosis not present

## 2022-09-25 DIAGNOSIS — M1712 Unilateral primary osteoarthritis, left knee: Secondary | ICD-10-CM | POA: Diagnosis not present

## 2022-09-29 ENCOUNTER — Telehealth: Payer: Self-pay | Admitting: Family Medicine

## 2022-09-29 NOTE — Telephone Encounter (Signed)
Called [facility]- this is a cardiology patient. Please ask cardiology for cardiac clearance for knee surgery, I will clear medically

## 2022-09-30 ENCOUNTER — Telehealth: Payer: Self-pay | Admitting: *Deleted

## 2022-09-30 DIAGNOSIS — D123 Benign neoplasm of transverse colon: Secondary | ICD-10-CM | POA: Diagnosis not present

## 2022-09-30 DIAGNOSIS — D125 Benign neoplasm of sigmoid colon: Secondary | ICD-10-CM | POA: Diagnosis not present

## 2022-09-30 DIAGNOSIS — K648 Other hemorrhoids: Secondary | ICD-10-CM | POA: Diagnosis not present

## 2022-09-30 DIAGNOSIS — D122 Benign neoplasm of ascending colon: Secondary | ICD-10-CM | POA: Diagnosis not present

## 2022-09-30 DIAGNOSIS — Z86010 Personal history of colonic polyps: Secondary | ICD-10-CM | POA: Diagnosis not present

## 2022-09-30 DIAGNOSIS — Z09 Encounter for follow-up examination after completed treatment for conditions other than malignant neoplasm: Secondary | ICD-10-CM | POA: Diagnosis not present

## 2022-09-30 HISTORY — PX: COLONOSCOPY WITH PROPOFOL: SHX5780

## 2022-09-30 LAB — HM COLONOSCOPY

## 2022-09-30 NOTE — Telephone Encounter (Signed)
Pt agreeable to tele pre op appt 11/20/22 @ 9 am. Med rec and consent are done.

## 2022-09-30 NOTE — Telephone Encounter (Signed)
Pt is returning call. Transferred to Julaine Hua, [place].

## 2022-09-30 NOTE — Telephone Encounter (Signed)
   Pre-operative Risk Assessment    Patient Name: John Wagner.  DOB: [birth year] MRN: [id number]      Request for Surgical Clearance    Procedure:   LEFT TOTAL KNEE REPLACEMENT  Date of Surgery:  Clearance TBD                                 Surgeon:  Charlies Constable, MD Surgeon's Group or Practice Name:  [facility] Phone number:  [phone] Fax number:  [phone]   Type of Clearance Requested:   - Medical    Type of Anesthesia:  Spinal   Additional requests/questions:    SignedJeanann Lewandowsky   09/30/2022, 7:26 AM

## 2022-09-30 NOTE — Telephone Encounter (Signed)
Pt agreeable to tele pre op appt 11/20/22 @ 9 am. Med rec and consent are done.      Patient Consent for Virtual Visit        John Wagner. has provided verbal consent on 09/30/2022 for a virtual visit (video or telephone).   CONSENT FOR VIRTUAL VISIT FOR:  John Wagner.  By participating in this virtual visit I agree to the following:  I hereby voluntarily request, consent and authorize [facility] and its employed or contracted physicians, physician assistants, nurse practitioners or other licensed health care professionals (the Practitioner), to provide me with telemedicine health care services (the "Services") as deemed necessary by the treating Practitioner. I acknowledge and consent to receive the Services by the Practitioner via telemedicine. I understand that the telemedicine visit will involve communicating with the Practitioner through live audiovisual communication technology and the disclosure of certain medical information by electronic transmission. I acknowledge that I have been given the opportunity to request an in-person assessment or other available alternative prior to the telemedicine visit and am voluntarily participating in the telemedicine visit.  I understand that I have the right to withhold or withdraw my consent to the use of telemedicine in the course of my care at any time, without affecting my right to future care or treatment, and that the Practitioner or I may terminate the telemedicine visit at any time. I understand that I have the right to inspect all information obtained and/or recorded in the course of the telemedicine visit and may receive copies of available information for a reasonable fee.  I understand that some of the potential risks of receiving the Services via telemedicine include:  Delay or interruption in medical evaluation due to technological equipment failure or disruption; Information transmitted may not be sufficient (e.g. poor  resolution of images) to allow for appropriate medical decision making by the Practitioner; and/or  In rare instances, security protocols could fail, causing a breach of personal health information.  Furthermore, I acknowledge that it is my responsibility to provide information about my medical history, conditions and care that is complete and accurate to the best of my ability. I acknowledge that Practitioner's advice, recommendations, and/or decision may be based on factors not within their control, such as incomplete or inaccurate data provided by me or distortions of diagnostic images or specimens that may result from electronic transmissions. I understand that the practice of medicine is not an exact science and that Practitioner makes no warranties or guarantees regarding treatment outcomes. I acknowledge that a copy of this consent can be made available to me via my patient portal ([facility]), or I can request a printed copy by calling the office of [facility].    I understand that my insurance will be billed for this visit.   I have read or had this consent read to me. I understand the contents of this consent, which adequately explains the benefits and risks of the Services being provided via telemedicine.  I have been provided ample opportunity to ask questions regarding this consent and the Services and have had my questions answered to my satisfaction. I give my informed consent for the services to be provided through the use of telemedicine in my medical care

## 2022-09-30 NOTE — Telephone Encounter (Signed)
Tried to call the pt to set up tele pre op appt, no answer.  

## 2022-09-30 NOTE — Telephone Encounter (Signed)
Primary Cardiologist:Kardie Tobb, DO   Preoperative team, please contact this patient and set up a phone call appointment for further preoperative risk assessment. Please obtain consent and complete medication review. Thank you for your help.   I confirm that guidance regarding antiplatelet and oral anticoagulation therapy has been completed and, if necessary, noted below (none requested).  Emmaline Life, NP-C  09/30/2022, 8:13 AM [address] Office [phone] Fax [phone]

## 2022-10-02 DIAGNOSIS — D123 Benign neoplasm of transverse colon: Secondary | ICD-10-CM | POA: Diagnosis not present

## 2022-10-02 DIAGNOSIS — D125 Benign neoplasm of sigmoid colon: Secondary | ICD-10-CM | POA: Diagnosis not present

## 2022-10-06 DIAGNOSIS — C61 Malignant neoplasm of prostate: Secondary | ICD-10-CM | POA: Diagnosis not present

## 2022-10-13 DIAGNOSIS — R3912 Poor urinary stream: Secondary | ICD-10-CM | POA: Diagnosis not present

## 2022-10-13 DIAGNOSIS — C61 Malignant neoplasm of prostate: Secondary | ICD-10-CM | POA: Diagnosis not present

## 2022-10-13 DIAGNOSIS — N401 Enlarged prostate with lower urinary tract symptoms: Secondary | ICD-10-CM | POA: Diagnosis not present

## 2022-11-20 ENCOUNTER — Ambulatory Visit: Payer: PPO | Attending: Cardiology | Admitting: General Practice

## 2022-11-20 DIAGNOSIS — Z01810 Encounter for preprocedural cardiovascular examination: Secondary | ICD-10-CM | POA: Diagnosis not present

## 2022-11-20 NOTE — Progress Notes (Signed)
Virtual Visit via Telephone Note   Because of John Koerber Jr.'s co-morbid illnesses, he is at least at moderate risk for complications without adequate follow up.  This format is felt to be most appropriate for this patient at this time.  The patient did not have access to video technology/had technical difficulties with video requiring transitioning to audio format only (telephone).  All issues noted in this document were discussed and addressed.  No physical exam could be performed with this format.  Please refer to the patient's chart for his consent to telehealth for [facility].  Evaluation Performed:  Preoperative cardiovascular risk assessment _____________   Date:  11/20/2022   Patient ID:  John Quince., DOB 02-22-1951, MRN [id number] Patient Location:  Home Provider location:   Office  Primary Care Provider:  Darreld Mclean, MD Primary Cardiologist:  Berniece Salines, DO  Chief Complaint / Patient Profile   71 y.o. y/o male with a h/o hyperlipidemia, HTN, prediabetes, overweight who is pending Left total knee replacement, and presents today for telephonic preoperative cardiovascular risk assessment.  Past Medical History    Past Medical History:  Diagnosis Date   Anxiety    Elevated PSA    Seeing Dr. Para March   Hyperlipidemia    Hypertension    Pre-syncope    Psoriasis    Past Surgical History:  Procedure Laterality Date   APPENDECTOMY     COLONOSCOPY     RADIOACTIVE SEED IMPLANT N/A 01/21/2022   Procedure: RADIOACTIVE SEED IMPLANT/BRACHYTHERAPY IMPLANT;  Surgeon: [name], MD;  Location: [facility];  Service: Urology;  Laterality: N/A;  90 MINS   SPACE OAR INSTILLATION N/A 01/21/2022   Procedure: SPACE OAR INSTILLATION;  Surgeon: [name], MD;  Location[facility];  Service: Urology;  Laterality: N/A;    Allergies  Allergies  Allergen Reactions   Buspar [Buspirone]     CNS   Statins      myalgias    History of Present Illness    John Luhmann. is a 71 y.o. male who presents via audio/video conferencing for a telehealth visit today.  Pt was last seen in cardiology clinic on 02/11/2022 by Dr. Harriet Masson.  At that time Lyndel Sarate. was doing well .  The patient is now pending procedure as outlined above. Since his last visit, he remains stable from a cardiac standpoint.  Today he denies chest pain, shortness of breath, lower extremity edema, fatigue, palpitations, melena, hematuria, hemoptysis, diaphoresis, weakness, presyncope, syncope, orthopnea, and PND.  Home Medications    Prior to Admission medications   Medication Sig Start Date End Date Taking? Authorizing Provider  acyclovir (ZOVIRAX) 400 MG tablet TAKE 1 TABLET BY MOUTH THREE TIMES DAILY AS NEEDED FOR OUTBREAKS 10/01/20   Copland, [name], MD  ALPRAZolam (XANAX) 1 MG tablet TAKE 1/2 TABLET BY MOUTH AT NOON AND TAKE 1/2 TABLET BY MOUTH EVERY NIGHT AT BEDTIME. MAY TAKE A WHOLE TABLET IF NEEDED ON OCCASION 07/01/22   Copland, [name], MD  betamethasone valerate lotion (VALISONE) 0.1 % Apply 1 application topically 2 (two) times daily. Reported on 01/23/2016    [provider]  COVID-19 mRNA bivalent vaccine, Pfizer, (PFIZER COVID-19 VAC BIVALENT) injection Inject into the muscle. 07/14/22   Carlyle Basques, MD  hydrOXYzine (ATARAX/VISTARIL) 25 MG tablet Take 1 tablet (25 mg total) by mouth every 8 (eight) hours as needed for itching. 05/22/17   Saguier, Percell Miller, PA-C  ibuprofen (ADVIL) 400  MG tablet Take 400 mg by mouth as needed for mild pain or headache.    [provider]  lisinopril (ZESTRIL) 5 MG tablet TAKE ONE TABLET BY MOUTH DAILY 02/06/22   Copland, [name], MD  propranolol (INDERAL) 10 MG tablet TAKE 1 TABLET BY MOUTH TWO TIMES A DAY AS NEEDED FOR PERFORMANCE ANXIETY 07/28/22   Copland, [name], MD  REPATHA SURECLICK 818 MG/ML SOAJ INJECT '140MG'$ S INTO THE SKIN EVERY 14 DAYS 08/27/22   Tobb, Kardie, DO   tadalafil (CIALIS) 20 MG tablet Take 1 tablet (20 mg total) by mouth daily as needed for erectile dysfunction. 01/13/22   Copland, [name], MD  tamsulosin (FLOMAX) 0.4 MG CAPS capsule Take 1 capsule (0.4 mg total) by mouth daily after supper. 02/12/22   Bruning, Ashlyn, PA-C  traMADol (ULTRAM) 50 MG tablet Take 1 tablet (50 mg total) by mouth every 8 (eight) hours as needed. 07/14/22   Copland, [name], MD    Physical Exam    Vital Signs:  Areon Cocuzza. does not have vital signs available for review today.  Given telephonic nature of communication, physical exam is limited. AAOx3. NAD. Normal affect.  Speech and respirations are unlabored.  Accessory Clinical Findings    None  Assessment & Plan    1.  Preoperative Cardiovascular Risk Assessment: Left total knee replacement, Dr. Charlies Constable, [facility]    Primary Cardiologist: Berniece Salines, DO  Chart reviewed as part of pre-operative protocol coverage. Given past medical history and time since last visit, based on ACC/AHA guidelines, Susan Arana. would be at acceptable risk for the planned procedure without further cardiovascular testing.   Patient was advised that if he develops new symptoms prior to surgery to contact our office to arrange a follow-up appointment.  He verbalized understanding.  His RCRI is a class II risk, 0.9% risk of major cardiac event.  He is able to complete greater than 4 METS of physical activity.   I will route this recommendation to the requesting party via Epic fax function and remove from pre-op pool.    Time:   Today, I have spent 5 minutes with the patient with telehealth technology discussing medical history, symptoms, and management plan.  Prior to their phone evaluation I spent greater than 10 minutes reviewing her past medical history and cardiac medications   Deberah Pelton, NP  11/20/2022, 7:54 AM

## 2022-11-24 ENCOUNTER — Encounter: Payer: Self-pay | Admitting: Family Medicine

## 2022-12-01 ENCOUNTER — Telehealth: Payer: Self-pay | Admitting: *Deleted

## 2022-12-01 NOTE — Telephone Encounter (Signed)
LMOM for pt to schedule AWV. 

## 2022-12-06 ENCOUNTER — Other Ambulatory Visit: Payer: Self-pay | Admitting: Family Medicine

## 2022-12-06 DIAGNOSIS — G8929 Other chronic pain: Secondary | ICD-10-CM

## 2022-12-08 ENCOUNTER — Encounter: Payer: Self-pay | Admitting: Family Medicine

## 2022-12-11 ENCOUNTER — Other Ambulatory Visit: Payer: Self-pay | Admitting: Family Medicine

## 2022-12-11 DIAGNOSIS — B009 Herpesviral infection, unspecified: Secondary | ICD-10-CM

## 2022-12-17 DIAGNOSIS — M25562 Pain in left knee: Secondary | ICD-10-CM | POA: Diagnosis not present

## 2022-12-17 DIAGNOSIS — M1712 Unilateral primary osteoarthritis, left knee: Secondary | ICD-10-CM | POA: Diagnosis not present

## 2022-12-17 DIAGNOSIS — Z01812 Encounter for preprocedural laboratory examination: Secondary | ICD-10-CM | POA: Diagnosis not present

## 2023-01-04 NOTE — Progress Notes (Unsigned)
[place] at [facility] [address] [place], [place] [address] [phone]  Date:  01/05/2023   Name:  John Wagner.   DOB:  [birth year]   MRN:  [id number]  PCP:  Darreld Mclean, MD    Chief Complaint: No chief complaint on file.   History of Present Illness:  John Wagner. is a 72 y.o. very pleasant male patient who presents with the following:  Patient seen today for periodic follow-up Most recent visit with myself was in July- History of prediabetes, BPH, hypertension, hyperlipidemia, anxiety treated with xanax, joint pain, prostate cancer diagnosed 2022 and treated with radioactive seed implantation ?  Urology is doing his PSA  He plans to have a knee replacement soon He was seen by cardiology for a preop check in November-cleared without further evaluation at this time Stress test 2018, negative  Flu vaccine Recommend COVID booster Recommend Shingrix  Lab work was completed in July, BMP, lipid, CBC, A1c 6.3%  Patient Active Problem List   Diagnosis Date Noted   Prediabetes 02/11/2022   Overweight (BMI 25.0-29.9) 02/11/2022   Malignant neoplasm of prostate ([place]) 10/25/2021   Elevated PSA    BPH (benign prostatic hyperplasia) 12/25/2014   Erectile dysfunction 03/10/2014   Anxiety 12/06/2012   Psoriasis 12/06/2012   Hypertension    Hyperlipidemia     Past Medical History:  Diagnosis Date   Anxiety    Elevated PSA    Seeing Dr. Para March   Hyperlipidemia    Hypertension    Pre-syncope    Psoriasis     Past Surgical History:  Procedure Laterality Date   APPENDECTOMY     COLONOSCOPY     RADIOACTIVE SEED IMPLANT N/A 01/21/2022   Procedure: RADIOACTIVE SEED IMPLANT/BRACHYTHERAPY IMPLANT;  Surgeon: [name], MD;  Location: [facility];  Service: Urology;  Laterality: N/A;  90 MINS   SPACE OAR INSTILLATION N/A 01/21/2022   Procedure: SPACE OAR INSTILLATION;  Surgeon: [name], MD;  Location: [facility];  Service: Urology;  Laterality: N/A;    Social History   Tobacco Use   Smoking status: Former   Smokeless tobacco: Never  Substance Use Topics   Alcohol use: Yes    Alcohol/week: 7.0 standard drinks of alcohol    Types: 7 Glasses of wine per week   Drug use: No    Family History  Problem Relation Age of Onset   Cancer Father        prostate cancer   Hypertension Mother    Heart disease Mother     Allergies  Allergen Reactions   Buspar [Buspirone]     CNS   Statins     myalgias    Medication list has been reviewed and updated.  Current Outpatient Medications on File Prior to Visit  Medication Sig Dispense Refill   acyclovir (ZOVIRAX) 400 MG tablet TAKE ONE TABLET BY MOUTH THREE TIMES A DAY AS NEEDED FOR OUTBREAKS 45 tablet 3   ALPRAZolam (XANAX) 1 MG tablet TAKE 1/2 TABLET BY MOUTH AT NOON AND TAKE 1/2 TABLET BY MOUTH EVERY NIGHT AT BEDTIME. MAY TAKE A WHOLE TABLET IF NEEDED ON OCCASION 60 tablet 3   betamethasone valerate lotion (VALISONE) 0.1 % Apply 1 application topically 2 (two) times daily. Reported on 01/23/2016     COVID-19 mRNA bivalent vaccine, Pfizer, (PFIZER COVID-19 VAC BIVALENT) injection Inject into the muscle. 0.3 mL 0  hydrOXYzine (ATARAX/VISTARIL) 25 MG tablet Take 1 tablet (25 mg total) by mouth every 8 (eight) hours as needed for itching. 21 tablet 0   ibuprofen (ADVIL) 400 MG tablet Take 400 mg by mouth as needed for mild pain or headache.     lisinopril (ZESTRIL) 5 MG tablet TAKE ONE TABLET BY MOUTH DAILY 90 tablet 3   propranolol (INDERAL) 10 MG tablet TAKE 1 TABLET BY MOUTH TWO TIMES A DAY AS NEEDED FOR PERFORMANCE ANXIETY 30 tablet 3   REPATHA SURECLICK 354 MG/ML SOAJ INJECT '140MG'$ S INTO THE SKIN EVERY 14 DAYS 2 mL 11   tadalafil (CIALIS) 20 MG tablet Take 1 tablet (20 mg total) by mouth daily as needed for erectile dysfunction. 30 tablet 2   tamsulosin (FLOMAX) 0.4 MG CAPS capsule Take 1 capsule  (0.4 mg total) by mouth daily after supper. 30 capsule 2   traMADol (ULTRAM) 50 MG tablet TAKE 1 TABLET BY MOUTH EVERY 8 HOURS AS NEEDED 30 tablet 2   No current facility-administered medications on file prior to visit.    Review of Systems:  As per HPI- otherwise negative.   Physical Examination: There were no vitals filed for this visit. There were no vitals filed for this visit. There is no height or weight on file to calculate BMI. Ideal Body Weight:    GEN: no acute distress. HEENT: Atraumatic, Normocephalic.  Ears and Nose: No external deformity. CV: RRR, No M/G/R. No JVD. No thrill. No extra heart sounds. PULM: CTA B, no wheezes, crackles, rhonchi. No retractions. No resp. distress. No accessory muscle use. ABD: S, NT, ND, +BS. No rebound. No HSM. EXTR: No c/c/e PSYCH: Normally interactive. Conversant.    Assessment and Plan: ***  Signed Lamar Blinks, MD

## 2023-01-04 NOTE — Patient Instructions (Incomplete)
It was great to see you again today, Best of luck with your knee replacement Suggest getting the latest COVID booster, shingles series at your pharmacy if not done already.  You might also think about getting an RSV shot  Assuming all is well lets check back in about 6 months  Thanks for sharing your piping video with me!

## 2023-01-05 ENCOUNTER — Encounter: Payer: Self-pay | Admitting: Family Medicine

## 2023-01-05 ENCOUNTER — Ambulatory Visit (INDEPENDENT_AMBULATORY_CARE_PROVIDER_SITE_OTHER): Payer: PPO | Admitting: Family Medicine

## 2023-01-05 VITALS — BP 118/78 | HR 69 | Temp 97.8°F | Resp 18 | Ht 66.5 in | Wt 178.4 lb

## 2023-01-05 DIAGNOSIS — E785 Hyperlipidemia, unspecified: Secondary | ICD-10-CM

## 2023-01-05 DIAGNOSIS — M1712 Unilateral primary osteoarthritis, left knee: Secondary | ICD-10-CM | POA: Diagnosis not present

## 2023-01-05 DIAGNOSIS — M25569 Pain in unspecified knee: Secondary | ICD-10-CM | POA: Diagnosis not present

## 2023-01-05 DIAGNOSIS — I10 Essential (primary) hypertension: Secondary | ICD-10-CM

## 2023-01-05 DIAGNOSIS — R7303 Prediabetes: Secondary | ICD-10-CM

## 2023-01-05 DIAGNOSIS — G8929 Other chronic pain: Secondary | ICD-10-CM

## 2023-01-05 DIAGNOSIS — M6281 Muscle weakness (generalized): Secondary | ICD-10-CM | POA: Diagnosis not present

## 2023-01-05 DIAGNOSIS — M25662 Stiffness of left knee, not elsewhere classified: Secondary | ICD-10-CM | POA: Diagnosis not present

## 2023-01-05 DIAGNOSIS — R262 Difficulty in walking, not elsewhere classified: Secondary | ICD-10-CM | POA: Diagnosis not present

## 2023-01-05 LAB — HEMOGLOBIN A1C: Hgb A1c MFr Bld: 6.1 % (ref 4.6–6.5)

## 2023-01-05 LAB — CBC
HCT: 43.2 % (ref 39.0–52.0)
Hemoglobin: 14.5 g/dL (ref 13.0–17.0)
MCHC: 33.5 g/dL (ref 30.0–36.0)
MCV: 86.1 fl (ref 78.0–100.0)
Platelets: 356 10*3/uL (ref 150.0–400.0)
RBC: 5.01 Mil/uL (ref 4.22–5.81)
RDW: 14.5 % (ref 11.5–15.5)
WBC: 4.9 10*3/uL (ref 4.0–10.5)

## 2023-01-05 LAB — COMPREHENSIVE METABOLIC PANEL
ALT: 20 U/L (ref 0–53)
AST: 25 U/L (ref 0–37)
Albumin: 4.1 g/dL (ref 3.5–5.2)
Alkaline Phosphatase: 38 U/L — ABNORMAL LOW (ref 39–117)
BUN: 17 mg/dL (ref 6–23)
CO2: 29 mEq/L (ref 19–32)
Calcium: 9 mg/dL (ref 8.4–10.5)
Chloride: 103 mEq/L (ref 96–112)
Creatinine, Ser: 1.01 mg/dL (ref 0.40–1.50)
GFR: 75.69 mL/min (ref >60.00)
Glucose, Bld: 99 mg/dL (ref 70–99)
Potassium: 4.5 mEq/L (ref 3.5–5.1)
Sodium: 140 mEq/L (ref 135–145)
Total Bilirubin: 0.8 mg/dL (ref 0.2–1.2)
Total Protein: 6.1 g/dL (ref 6.0–8.3)

## 2023-01-07 ENCOUNTER — Ambulatory Visit: Payer: PPO | Admitting: Family Medicine

## 2023-01-07 DIAGNOSIS — M1712 Unilateral primary osteoarthritis, left knee: Secondary | ICD-10-CM | POA: Diagnosis not present

## 2023-01-07 DIAGNOSIS — Z96652 Presence of left artificial knee joint: Secondary | ICD-10-CM | POA: Diagnosis not present

## 2023-01-07 DIAGNOSIS — G8918 Other acute postprocedural pain: Secondary | ICD-10-CM | POA: Diagnosis not present

## 2023-01-07 HISTORY — PX: TOTAL KNEE ARTHROPLASTY: SHX125

## 2023-01-09 DIAGNOSIS — M1712 Unilateral primary osteoarthritis, left knee: Secondary | ICD-10-CM | POA: Diagnosis not present

## 2023-01-09 DIAGNOSIS — M6281 Muscle weakness (generalized): Secondary | ICD-10-CM | POA: Diagnosis not present

## 2023-01-09 DIAGNOSIS — R262 Difficulty in walking, not elsewhere classified: Secondary | ICD-10-CM | POA: Diagnosis not present

## 2023-01-09 DIAGNOSIS — M25662 Stiffness of left knee, not elsewhere classified: Secondary | ICD-10-CM | POA: Diagnosis not present

## 2023-01-13 DIAGNOSIS — R262 Difficulty in walking, not elsewhere classified: Secondary | ICD-10-CM | POA: Diagnosis not present

## 2023-01-13 DIAGNOSIS — M6281 Muscle weakness (generalized): Secondary | ICD-10-CM | POA: Diagnosis not present

## 2023-01-13 DIAGNOSIS — M25662 Stiffness of left knee, not elsewhere classified: Secondary | ICD-10-CM | POA: Diagnosis not present

## 2023-01-13 DIAGNOSIS — M1712 Unilateral primary osteoarthritis, left knee: Secondary | ICD-10-CM | POA: Diagnosis not present

## 2023-01-15 DIAGNOSIS — R262 Difficulty in walking, not elsewhere classified: Secondary | ICD-10-CM | POA: Diagnosis not present

## 2023-01-15 DIAGNOSIS — M1712 Unilateral primary osteoarthritis, left knee: Secondary | ICD-10-CM | POA: Diagnosis not present

## 2023-01-15 DIAGNOSIS — M6281 Muscle weakness (generalized): Secondary | ICD-10-CM | POA: Diagnosis not present

## 2023-01-15 DIAGNOSIS — M25662 Stiffness of left knee, not elsewhere classified: Secondary | ICD-10-CM | POA: Diagnosis not present

## 2023-01-19 DIAGNOSIS — M6281 Muscle weakness (generalized): Secondary | ICD-10-CM | POA: Diagnosis not present

## 2023-01-19 DIAGNOSIS — M1712 Unilateral primary osteoarthritis, left knee: Secondary | ICD-10-CM | POA: Diagnosis not present

## 2023-01-19 DIAGNOSIS — R262 Difficulty in walking, not elsewhere classified: Secondary | ICD-10-CM | POA: Diagnosis not present

## 2023-01-19 DIAGNOSIS — M25662 Stiffness of left knee, not elsewhere classified: Secondary | ICD-10-CM | POA: Diagnosis not present

## 2023-01-22 DIAGNOSIS — M6281 Muscle weakness (generalized): Secondary | ICD-10-CM | POA: Diagnosis not present

## 2023-01-22 DIAGNOSIS — R262 Difficulty in walking, not elsewhere classified: Secondary | ICD-10-CM | POA: Diagnosis not present

## 2023-01-22 DIAGNOSIS — M25662 Stiffness of left knee, not elsewhere classified: Secondary | ICD-10-CM | POA: Diagnosis not present

## 2023-01-22 DIAGNOSIS — M1712 Unilateral primary osteoarthritis, left knee: Secondary | ICD-10-CM | POA: Diagnosis not present

## 2023-01-27 DIAGNOSIS — R262 Difficulty in walking, not elsewhere classified: Secondary | ICD-10-CM | POA: Diagnosis not present

## 2023-01-27 DIAGNOSIS — M1712 Unilateral primary osteoarthritis, left knee: Secondary | ICD-10-CM | POA: Diagnosis not present

## 2023-01-27 DIAGNOSIS — M25662 Stiffness of left knee, not elsewhere classified: Secondary | ICD-10-CM | POA: Diagnosis not present

## 2023-01-27 DIAGNOSIS — M6281 Muscle weakness (generalized): Secondary | ICD-10-CM | POA: Diagnosis not present

## 2023-01-30 ENCOUNTER — Other Ambulatory Visit: Payer: Self-pay | Admitting: Family Medicine

## 2023-01-30 DIAGNOSIS — F411 Generalized anxiety disorder: Secondary | ICD-10-CM

## 2023-02-02 DIAGNOSIS — M25662 Stiffness of left knee, not elsewhere classified: Secondary | ICD-10-CM | POA: Diagnosis not present

## 2023-02-02 DIAGNOSIS — M6281 Muscle weakness (generalized): Secondary | ICD-10-CM | POA: Diagnosis not present

## 2023-02-02 DIAGNOSIS — R262 Difficulty in walking, not elsewhere classified: Secondary | ICD-10-CM | POA: Diagnosis not present

## 2023-02-02 DIAGNOSIS — M1712 Unilateral primary osteoarthritis, left knee: Secondary | ICD-10-CM | POA: Diagnosis not present

## 2023-02-03 ENCOUNTER — Encounter: Payer: Self-pay | Admitting: Family Medicine

## 2023-02-03 DIAGNOSIS — G8929 Other chronic pain: Secondary | ICD-10-CM

## 2023-02-03 DIAGNOSIS — F411 Generalized anxiety disorder: Secondary | ICD-10-CM

## 2023-02-04 ENCOUNTER — Ambulatory Visit: Payer: Self-pay | Admitting: Licensed Clinical Social Worker

## 2023-02-04 MED ORDER — ALPRAZOLAM 1 MG PO TABS: ORAL_TABLET | ORAL | 2 refills | Status: DC

## 2023-02-04 MED ORDER — TRAMADOL HCL 50 MG PO TABS: 50.0000 mg | ORAL_TABLET | Freq: Three times a day (TID) | ORAL | 2 refills | Status: DC | PRN

## 2023-02-04 NOTE — Patient Outreach (Signed)
  Care Coordination  Initial Visit Note   02/04/2023 Name: John Wagner. MRN: [id number] DOB: [birth year]  John Wagner. is a 72 y.o. year old male who sees Copland, [name], MD for primary care. I spoke with  John Wagner. by phone today.  What matters to the patients health and wellness today?    Patient reports no concerns or needs from Care Coordination team with health and wellness related to physical or mental heath. .   SDOH assessments and interventions completed:  Yes  SDOH Interventions Today    Flowsheet Row Most Recent Value  SDOH Interventions   Food Insecurity Interventions Intervention Not Indicated  Housing Interventions Intervention Not Indicated  Transportation Interventions Intervention Not Indicated       Care Coordination Interventions:  Yes, provided  Interventions Today    Flowsheet Row Most Recent Value  Chronic Disease   Chronic disease during today's visit Hypertension (HTN)  General Interventions   General Interventions Discussed/Reviewed General Interventions Discussed  [reviewed care coordination services]  Education Interventions   Education Provided Provided Education  Mental Health Interventions   Mental Health Discussed/Reviewed Mental Health Discussed       Follow up plan: No further intervention required.   Encounter Outcome:  Pt. Visit Completed   John Wagner, [place] [phone]

## 2023-02-04 NOTE — Patient Instructions (Signed)
Visit Information  Thank you for taking time to visit with me today. Please don't hesitate to contact me if I can be of assistance to you.   Care Coordination provides support specific to your health needs that extend beyond exceptional routine office care you already receive from your primary care doctor.    If you are eligible for standard Care Coordination, there is no cost to you.  The Care Coordination team is made up of the following team members: Registered Nurse Care Guide: disease management, health education, care coordination and complex case management Clinical Social Work: Complex Care Coordination including coordination of level of care needs, mental and behavioral health assessment and recommendations, and connection to long-term mental health support Clinical Pharmacist: medication management, assistance and disease management Community Resource Care Guides: Forensic psychologist Team: dedicated team of scheduling professionals to support patient and clinical team scheduling needs  Please call [phone] if you would like to schedule a phone appointment with one of the team members.   If you are experiencing a Mental Health or [place] or need someone to talk to, please call the Suicide and Crisis Lifeline: 988 call the [place] National Suicide Prevention Lifeline: (445) 004-5971 or TTY: (862) 714-1068 TTY 707-309-4455) to talk to a trained counselor call 1-800-273-TALK (toll free, 24 hour hotline) go to [facility] [address] [place] [phone])   Patient verbalizes understanding of instructions and care plan provided today and agrees to view in [place]. Active MyChart status and patient understanding of how to access instructions and care plan via MyChart confirmed with patient.     No further follow up required: By Care Coordination at this time  Casimer Lanius, [place] [phone]

## 2023-02-05 DIAGNOSIS — R262 Difficulty in walking, not elsewhere classified: Secondary | ICD-10-CM | POA: Diagnosis not present

## 2023-02-05 DIAGNOSIS — M1712 Unilateral primary osteoarthritis, left knee: Secondary | ICD-10-CM | POA: Diagnosis not present

## 2023-02-05 DIAGNOSIS — M25662 Stiffness of left knee, not elsewhere classified: Secondary | ICD-10-CM | POA: Diagnosis not present

## 2023-02-05 DIAGNOSIS — M6281 Muscle weakness (generalized): Secondary | ICD-10-CM | POA: Diagnosis not present

## 2023-02-12 ENCOUNTER — Telehealth: Payer: Self-pay | Admitting: Family Medicine

## 2023-02-12 NOTE — Telephone Encounter (Signed)
Copied from [place] [id number]. Topic: Medicare AWV >> Feb 12, 2023  2:32 PM Devoria Glassing wrote: Reason for CRM: Called patient to schedule Medicare Annual Wellness Visit (AWV). Left message for patient to call back and schedule Medicare Annual Wellness Visit (AWV).  Last date of AWV: 08/29/2021   Please schedule an appointment at any time with [facility].  If any questions, please contact me.  Thank you ,  Sherol Dade; [place] Direct Dial: [phone]

## 2023-02-17 DIAGNOSIS — M25662 Stiffness of left knee, not elsewhere classified: Secondary | ICD-10-CM | POA: Diagnosis not present

## 2023-02-17 DIAGNOSIS — R262 Difficulty in walking, not elsewhere classified: Secondary | ICD-10-CM | POA: Diagnosis not present

## 2023-02-17 DIAGNOSIS — M6281 Muscle weakness (generalized): Secondary | ICD-10-CM | POA: Diagnosis not present

## 2023-02-17 DIAGNOSIS — M1712 Unilateral primary osteoarthritis, left knee: Secondary | ICD-10-CM | POA: Diagnosis not present

## 2023-02-19 DIAGNOSIS — R262 Difficulty in walking, not elsewhere classified: Secondary | ICD-10-CM | POA: Diagnosis not present

## 2023-02-19 DIAGNOSIS — M1712 Unilateral primary osteoarthritis, left knee: Secondary | ICD-10-CM | POA: Diagnosis not present

## 2023-02-19 DIAGNOSIS — M25662 Stiffness of left knee, not elsewhere classified: Secondary | ICD-10-CM | POA: Diagnosis not present

## 2023-02-19 DIAGNOSIS — M6281 Muscle weakness (generalized): Secondary | ICD-10-CM | POA: Diagnosis not present

## 2023-02-26 ENCOUNTER — Encounter (HOSPITAL_BASED_OUTPATIENT_CLINIC_OR_DEPARTMENT_OTHER): Payer: Self-pay | Admitting: Orthopedic Surgery

## 2023-02-27 ENCOUNTER — Encounter (HOSPITAL_BASED_OUTPATIENT_CLINIC_OR_DEPARTMENT_OTHER): Payer: Self-pay | Admitting: Orthopedic Surgery

## 2023-02-27 NOTE — Progress Notes (Signed)
Spoke w/ via phone for pre-op interview--- pt Lab needs dos---- [facility], ekg              Lab results------ no COVID test -----patient states asymptomatic no test needed Arrive at ------- 0530 on 03-02-2023 NPO after MN NO Solid Food.  Clear liquids from MN until--- 0430 Med rec completed Medications to take morning of surgery ----- xanax Diabetic medication ----- n/a Patient instructed no nail polish to be worn day of surgery Patient instructed to bring photo id and insurance card day of surgery Patient aware to have Driver (ride ) / caregiver    for 24 hours after surgery -- sig other, linda Patient Special Instructions ----- n/a Pre-Op special Istructions ----- called and jleft message for OR scheduler for Dr Zachery Dakins, Claiborne Billings, requested pre-op orders. Patient verbalized understanding of instructions that were given at this phone interview. Patient denies shortness of breath, chest pain, fever, cough at this phone interview.

## 2023-02-28 NOTE — Anesthesia Preprocedure Evaluation (Addendum)
Anesthesia Evaluation  Patient identified by MRN, date of birth, ID band Patient awake    Reviewed: Allergy & Precautions, NPO status , Patient's Chart, lab work & pertinent test results  History of Anesthesia Complications Negative for: history of anesthetic complications  Airway Mallampati: II  TM Distance: >3 FB Neck ROM: Full    Dental  (+) Implants, Dental Advisory Given,    Pulmonary former smoker   Pulmonary exam normal        Cardiovascular hypertension, Pt. on medications and Pt. on home beta blockers Normal cardiovascular exam     Neuro/Psych  PSYCHIATRIC DISORDERS Anxiety     negative neurological ROS     GI/Hepatic negative GI ROS, Neg liver ROS,,,  Endo/Other  negative endocrine ROS    Renal/GU negative Renal ROS   Prostate ca    Musculoskeletal negative musculoskeletal ROS (+)    Abdominal   Peds  Hematology negative hematology ROS (+)   Anesthesia Other Findings   Reproductive/Obstetrics negative OB ROS                             Anesthesia Physical Anesthesia Plan  ASA: 2  Anesthesia Plan: General   Post-op Pain Management: Toradol IV (intra-op) and Tylenol PO (pre-op)*   Induction: Intravenous  PONV Risk Score and Plan: 2 and Ondansetron, Dexamethasone and Treatment may vary due to age or medical condition  Airway Management Planned: Mask and LMA  Additional Equipment: None  Intra-op Plan:   Post-operative Plan:   Informed Consent: I have reviewed the patients History and Physical, chart, labs and discussed the procedure including the risks, benefits and alternatives for the proposed anesthesia with the patient or authorized representative who has indicated his/her understanding and acceptance.     Dental advisory given  Plan Discussed with: Anesthesiologist, CRNA and Surgeon  Anesthesia Plan Comments:         Anesthesia Quick Evaluation

## 2023-03-02 ENCOUNTER — Ambulatory Visit (HOSPITAL_BASED_OUTPATIENT_CLINIC_OR_DEPARTMENT_OTHER): Payer: PPO | Admitting: Anesthesiology

## 2023-03-02 ENCOUNTER — Encounter (HOSPITAL_BASED_OUTPATIENT_CLINIC_OR_DEPARTMENT_OTHER): Payer: Self-pay | Admitting: Orthopedic Surgery

## 2023-03-02 ENCOUNTER — Ambulatory Visit (HOSPITAL_BASED_OUTPATIENT_CLINIC_OR_DEPARTMENT_OTHER)
Admission: RE | Admit: 2023-03-02 | Discharge: 2023-03-02 | Disposition: A | Discharge status: Home / Self Care | Payer: PPO | Source: Ambulatory Visit | Attending: Orthopedic Surgery | Admitting: Orthopedic Surgery

## 2023-03-02 ENCOUNTER — Other Ambulatory Visit: Payer: Self-pay

## 2023-03-02 ENCOUNTER — Encounter (HOSPITAL_BASED_OUTPATIENT_CLINIC_OR_DEPARTMENT_OTHER)
Admission: RE | Disposition: A | Discharge status: Home / Self Care | Payer: Self-pay | Source: Ambulatory Visit | Attending: Orthopedic Surgery

## 2023-03-02 DIAGNOSIS — M24662 Ankylosis, left knee: Secondary | ICD-10-CM | POA: Diagnosis not present

## 2023-03-02 DIAGNOSIS — I10 Essential (primary) hypertension: Secondary | ICD-10-CM | POA: Diagnosis not present

## 2023-03-02 DIAGNOSIS — Z8546 Personal history of malignant neoplasm of prostate: Secondary | ICD-10-CM | POA: Insufficient documentation

## 2023-03-02 DIAGNOSIS — Z79899 Other long term (current) drug therapy: Secondary | ICD-10-CM | POA: Insufficient documentation

## 2023-03-02 DIAGNOSIS — M6281 Muscle weakness (generalized): Secondary | ICD-10-CM | POA: Diagnosis not present

## 2023-03-02 DIAGNOSIS — M1712 Unilateral primary osteoarthritis, left knee: Secondary | ICD-10-CM | POA: Diagnosis not present

## 2023-03-02 DIAGNOSIS — F411 Generalized anxiety disorder: Secondary | ICD-10-CM | POA: Insufficient documentation

## 2023-03-02 DIAGNOSIS — T8482XA Fibrosis due to internal orthopedic prosthetic devices, implants and grafts, initial encounter: Secondary | ICD-10-CM

## 2023-03-02 DIAGNOSIS — Z87891 Personal history of nicotine dependence: Secondary | ICD-10-CM | POA: Diagnosis not present

## 2023-03-02 DIAGNOSIS — R262 Difficulty in walking, not elsewhere classified: Secondary | ICD-10-CM | POA: Diagnosis not present

## 2023-03-02 DIAGNOSIS — F419 Anxiety disorder, unspecified: Secondary | ICD-10-CM | POA: Diagnosis not present

## 2023-03-02 DIAGNOSIS — Z96652 Presence of left artificial knee joint: Secondary | ICD-10-CM | POA: Insufficient documentation

## 2023-03-02 DIAGNOSIS — M25662 Stiffness of left knee, not elsewhere classified: Secondary | ICD-10-CM | POA: Diagnosis not present

## 2023-03-02 DIAGNOSIS — Z01818 Encounter for other preprocedural examination: Secondary | ICD-10-CM

## 2023-03-02 DIAGNOSIS — E785 Hyperlipidemia, unspecified: Secondary | ICD-10-CM | POA: Diagnosis not present

## 2023-03-02 HISTORY — DX: Ankylosis, left knee: M24.662

## 2023-03-02 HISTORY — PX: KNEE CLOSED REDUCTION: SHX995

## 2023-03-02 HISTORY — DX: Benign prostatic hyperplasia without lower urinary tract symptoms: N40.0

## 2023-03-02 HISTORY — DX: Other complications of anesthesia, initial encounter: T88.59XA

## 2023-03-02 HISTORY — DX: Personal history of adenomatous and serrated colon polyps: Z86.0101

## 2023-03-02 HISTORY — DX: Male erectile dysfunction, unspecified: N52.9

## 2023-03-02 HISTORY — DX: Personal history of colonic polyps: Z86.010

## 2023-03-02 HISTORY — DX: Prediabetes: R73.03

## 2023-03-02 HISTORY — DX: Generalized anxiety disorder: F41.1

## 2023-03-02 LAB — POCT I-STAT, CHEM 8
BUN: 16 mg/dL (ref 8–23)
Calcium, Ion: 1.23 mmol/L (ref 1.15–1.40)
Chloride: 102 mmol/L (ref 98–111)
Creatinine, Ser: 1 mg/dL (ref 0.61–1.24)
Glucose, Bld: 99 mg/dL (ref 70–99)
HCT: 44 % (ref 39.0–52.0)
Hemoglobin: 15 g/dL (ref 13.0–17.0)
Potassium: 4.2 mmol/L (ref 3.5–5.1)
Sodium: 140 mmol/L (ref 135–145)
TCO2: 29 mmol/L (ref 22–32)

## 2023-03-02 SURGERY — MANIPULATION, KNEE, CLOSED
Anesthesia: General | Site: Knee | Laterality: Left

## 2023-03-02 MED ORDER — ACETAMINOPHEN 500 MG PO TABS
1000.0000 mg | ORAL_TABLET | Freq: Once | ORAL | Status: AC
  Administered 2023-03-02: 1000 mg via ORAL

## 2023-03-02 MED ORDER — PROPOFOL 10 MG/ML IV BOLUS
INTRAVENOUS | Status: AC
  Filled 2023-03-02: qty 20

## 2023-03-02 MED ORDER — FENTANYL CITRATE (PF) 100 MCG/2ML IJ SOLN
INTRAMUSCULAR | Status: DC | PRN
  Administered 2023-03-02: 50 ug via INTRAVENOUS
  Administered 2023-03-02 (×2): 25 ug via INTRAVENOUS

## 2023-03-02 MED ORDER — METHOCARBAMOL 500 MG PO TABS: 500.0000 mg | ORAL_TABLET | Freq: Three times a day (TID) | ORAL | 0 refills | Status: AC | PRN

## 2023-03-02 MED ORDER — OXYCODONE HCL 5 MG PO TABS: 5.0000 mg | ORAL_TABLET | Freq: Three times a day (TID) | ORAL | 0 refills | Status: AC | PRN

## 2023-03-02 MED ORDER — OXYCODONE HCL 5 MG PO TABS
5.0000 mg | ORAL_TABLET | Freq: Once | ORAL | Status: AC
  Administered 2023-03-02: 5 mg via ORAL

## 2023-03-02 MED ORDER — FENTANYL CITRATE (PF) 100 MCG/2ML IJ SOLN
INTRAMUSCULAR | Status: AC
  Filled 2023-03-02: qty 2

## 2023-03-02 MED ORDER — HYDROMORPHONE HCL 1 MG/ML IJ SOLN
INTRAMUSCULAR | Status: AC
  Filled 2023-03-02: qty 1

## 2023-03-02 MED ORDER — CELECOXIB 100 MG PO CAPS: 100.0000 mg | ORAL_CAPSULE | Freq: Two times a day (BID) | ORAL | 0 refills | Status: AC

## 2023-03-02 MED ORDER — DEXAMETHASONE SODIUM PHOSPHATE 10 MG/ML IJ SOLN
INTRAMUSCULAR | Status: AC
  Filled 2023-03-02: qty 1

## 2023-03-02 MED ORDER — FENTANYL CITRATE (PF) 100 MCG/2ML IJ SOLN: 25.0000 ug | INTRAMUSCULAR | Status: DC | PRN

## 2023-03-02 MED ORDER — AMISULPRIDE (ANTIEMETIC) 5 MG/2ML IV SOLN: 10.0000 mg | Freq: Once | INTRAVENOUS | Status: DC | PRN

## 2023-03-02 MED ORDER — ONDANSETRON HCL 4 MG/2ML IJ SOLN
INTRAMUSCULAR | Status: DC | PRN
  Administered 2023-03-02: 4 mg via INTRAVENOUS

## 2023-03-02 MED ORDER — ONDANSETRON HCL 4 MG/2ML IJ SOLN
INTRAMUSCULAR | Status: AC
  Filled 2023-03-02: qty 2

## 2023-03-02 MED ORDER — ACETAMINOPHEN 500 MG PO TABS
ORAL_TABLET | ORAL | Status: AC
  Filled 2023-03-02: qty 2

## 2023-03-02 MED ORDER — PROMETHAZINE HCL 25 MG/ML IJ SOLN: 6.2500 mg | INTRAMUSCULAR | Status: DC | PRN

## 2023-03-02 MED ORDER — OXYCODONE HCL 5 MG PO TABS
ORAL_TABLET | ORAL | Status: AC
  Filled 2023-03-02: qty 1

## 2023-03-02 MED ORDER — LIDOCAINE HCL (PF) 2 % IJ SOLN
INTRAMUSCULAR | Status: AC
  Filled 2023-03-02: qty 5

## 2023-03-02 MED ORDER — PROPOFOL 10 MG/ML IV BOLUS
INTRAVENOUS | Status: DC | PRN
  Administered 2023-03-02: 160 mg via INTRAVENOUS

## 2023-03-02 MED ORDER — LACTATED RINGERS IV SOLN: INTRAVENOUS | Status: DC

## 2023-03-02 MED ORDER — HYDROMORPHONE HCL 1 MG/ML IJ SOLN
0.5000 mg | INTRAMUSCULAR | Status: DC | PRN
  Administered 2023-03-02 (×2): 0.5 mg via INTRAVENOUS

## 2023-03-02 NOTE — Anesthesia Postprocedure Evaluation (Signed)
Anesthesia Post Note  Patient: John Wagner.  Procedure(s) Performed: CLOSED MANIPULATION KNEE (Left: Knee)     Patient location during evaluation: PACU Anesthesia Type: General Level of consciousness: sedated Pain management: pain level controlled Vital Signs Assessment: post-procedure vital signs reviewed and stable Respiratory status: spontaneous breathing and respiratory function stable Cardiovascular status: stable Postop Assessment: no apparent nausea or vomiting Anesthetic complications: no  No notable events documented.  Last Vitals:  Vitals:   03/02/23 0815 03/02/23 0857  BP: (!) 152/96 (!) 164/90  Pulse:  64  Resp: 20 (!) 21  Temp:    SpO2:  98%    Last Pain:  Vitals:   03/02/23 0857  TempSrc:   PainSc: 3                  [name]

## 2023-03-02 NOTE — Discharge Instructions (Addendum)
Orthopedic Discharge Instructions  Diet: As you were doing prior to hospitalization   Shower:  no restrictions  Dressing:  none  Activity:  Increase activity slowly as tolerated, but follow the weight bearing instructions below.  The rules on driving is that you can not be taking narcotics while you drive, and you must feel in control of the vehicle.    Weight Bearing:   weight bearing as tolerated.    Blood clot prevention (DVT Prophylaxis): none indicated  To prevent constipation: you may use a stool softener such as -  Colace (over the counter) 100 mg by mouth twice a day  Drink plenty of fluids (prune juice may be helpful) and high fiber foods Miralax (over the counter) for constipation as needed.    Itching:  If you experience itching with your medications, try taking only a single pain pill, or even half a pain pill at a time.  You may take up to 10 pain pills per day, and you can also use benadryl over the counter for itching or also to help with sleep.   Precautions:  If you experience chest pain or shortness of breath - call 911 immediately for transfer to the hospital emergency department!!   Call office [phone]) for the following: Temperature greater than 101F Persistent nausea and vomiting Severe uncontrolled pain Redness, tenderness, or signs of infection (pain, swelling, redness, odor or green/yellow discharge around the site) Difficulty breathing, headache or visual disturbances Hives Persistent dizziness or light-headedness Extreme fatigue Any other questions or concerns you may have after discharge  In an emergency, call 911 or go to an Emergency Department at a nearby hospital  Follow- Up Appointment:  Please call for an appointment to be seen approximately 2 week after surgery in [facility] with your surgeon Dr. Charlies Constable - [phone] Address: [address], [place], [place] [address]    Post Anesthesia Home Care  Instructions  Activity: Get plenty of rest for the remainder of the day. A responsible adult should stay with you for 24 hours following the procedure.  For the next 24 hours, DO NOT: -Drive a car -Paediatric nurse -Drink alcoholic beverages -Take any medication unless instructed by your physician -Make any legal decisions or sign important papers.  Meals: Start with liquid foods such as gelatin or soup. Progress to regular foods as tolerated. Avoid greasy, spicy, heavy foods. If nausea and/or vomiting occur, drink only clear liquids until the nausea and/or vomiting subsides. Call your physician if vomiting continues.  Special Instructions/Symptoms: Your throat may feel dry or sore from the anesthesia or the breathing tube placed in your throat during surgery. If this causes discomfort, gargle with warm salt water. The discomfort should disappear within 24 hours.

## 2023-03-02 NOTE — H&P (Signed)
ORTHOPAEDIC H&P  REQUESTING PHYSICIAN: Willaim Sheng, MD  Chief Complaint: stiffness after TKA  HPI: John Wagner. is a 72 y.o. male who underwent L knee TKA on 01/07/23. He did well after surgery but has been limited by post op stiffness achieving only about 90 degrees of flexion despite good ROM preop. Patient has been diligent and compliant with PT.  Past Medical History:  Diagnosis Date   Ankylosis, left knee    post TKA   BPH (benign prostatic hyperplasia)    Complication of anesthesia    per pt causes memory issues post op   ED (erectile dysfunction)    GAD (generalized anxiety disorder)    History of adenomatous polyp of colon    Hyperlipidemia    Hypertension    Malignant neoplasm prostate ([place]) 09/2021   urologist--- dr newsome/  radiation oncologist--- dr Tammi Klippel;  dx 10/ 2022, Gleason, 3+4;   01-21-2022  radioactive prostate seed implants   Postural dizziness with presyncope 07/2021   cardiologist evalution by dr Harriet Masson;  tested results in epic ---- event monitor 07-24-2021  shows SR w/ rare PAC/ PVC;  echo 08-13-2021  ef 55-60%, GIDD, mild MR/ AR,, AA 3.9cm,  ETT 08-13-2017 no ischemia   Pre-diabetes    Psoriasis    Past Surgical History:  Procedure Laterality Date   APPENDECTOMY     age 89   COLONOSCOPY WITH PROPOFOL  09/30/2022   dr Therisa Doyne   RADIOACTIVE SEED IMPLANT N/A 01/21/2022   Procedure: RADIOACTIVE SEED IMPLANT/BRACHYTHERAPY IMPLANT;  Surgeon: [name], MD;  Location: [facility];  Service: Urology;  Laterality: N/A;  90 MINS   SPACE OAR INSTILLATION N/A 01/21/2022   Procedure: SPACE OAR INSTILLATION;  Surgeon: [name], MD;  Location: [facility];  Service: Urology;  Laterality: N/A;   TOTAL KNEE ARTHROPLASTY Left 01/07/2023   @ [facility] by dr Zachery Dakins   Social History   Socioeconomic History   Marital status: Single    Spouse name: Not on file   Number of children: Not on file   Years of  education: Not on file   Highest education level: Not on file  Occupational History   Occupation: picture frame/artist    Employer: [employer]  Tobacco Use   Smoking status: Former   Smokeless tobacco: Never  Substance and Sexual Activity   Alcohol use: Yes    Alcohol/week: 7.0 standard drinks of alcohol    Types: 7 Glasses of wine per week   Drug use: No   Sexual activity: Yes  Other Topics Concern   Not on file  Social History Narrative   Not on file   Social Determinants of Health   Financial Resource Strain: Low Risk  (08/29/2021)   Overall Financial Resource Strain (CARDIA)    Difficulty of Paying Living Expenses: Not hard at all  Food Insecurity: No Food Insecurity (02/04/2023)   Hunger Vital Sign    Worried About Running Out of Food in the Last Year: Never true    Ran Out of Food in the Last Year: Never true  Transportation Needs: No Transportation Needs (02/04/2023)   PRAPARE - Hydrologist (Medical): No    Lack of Transportation (Non-Medical): No  Physical Activity: Inactive (08/29/2021)   Exercise Vital Sign    Days of Exercise per Week: 0 days    Minutes of Exercise per Session: 0 min  Stress: No Stress Concern Present (08/29/2021)   [place]  [employer]ational [employer] - Occupational Stress Questionnaire    Feeling of Stress : Not at all  Social Connections: Not on file   Family History  Problem Relation Age of Onset   Cancer Father        prostate cancer   Hypertension Mother    Heart disease Mother    Allergies  Allergen Reactions   Buspar [Buspirone]     Pt stated does remember reaction but does remember it was allergic reaction   Mobic [Meloxicam] Nausea And Vomiting   Statins     myalgias     Positive ROS: All other systems have been reviewed and were otherwise negative with the exception of those mentioned in the HPI and as above.  Physical Exam: General: Alert, no acute distress Cardiovascular: No pedal  edema Respiratory: No cyanosis, no use of accessory musculature Skin: No lesions in the area of chief complaint Neurologic: Sensation intact distally Psychiatric: Patient is competent for consent with normal mood and affect  MUSCULOSKELETAL:  LLE No traumatic wounds, ecchymosis, or rash  Mild swelling about L knee, well healing incision.  ROM 0-85  No groin pain with log roll  Knee stable to varus/ valgus stress  Sens DPN, SPN, TN intact  Motor EHL, ext, flex 5/5  DP 2+, PT 2+, No significant edema   Assessment: L knee stiffness after TKA  Plan: Manipulation under anesthesia indicated given post op stiffness now almost 2 months out from surgery despite good PT. Risks and benefits discussed including pain, swelling, stiffness post op. Patient agreeable with proceeding. Lknee marked. Consent signed.    Willaim Sheng, MD  Contact information:   [phone] 7am-5pm epic message Dr. Zachery Dakins, or call office for patient follow up: [phone] After hours and holidays please check [email or web address] for group call information for [facility]

## 2023-03-02 NOTE — Transfer of Care (Signed)
Immediate Anesthesia Transfer of Care Note  Patient: John Wagner.  Procedure(s) Performed: CLOSED MANIPULATION KNEE (Left: Knee)  Patient Location: PACU  Anesthesia Type:General  Level of Consciousness: awake, alert , and oriented  Airway & Oxygen Therapy: Patient Spontanous Breathing and Patient connected to face mask oxygen  Post-op Assessment: Report given to RN and Post -op Vital signs reviewed and stable  Post vital signs: Reviewed and stable  Last Vitals:  Vitals Value Taken Time  BP 146/109 03/02/23 0740  Temp 36.2 C 03/02/23 0740  Pulse 73 03/02/23 0743  Resp 21 03/02/23 0743  SpO2 100 % 03/02/23 0743  Vitals shown include unvalidated device data.  Last Pain:  Vitals:   03/02/23 0612  TempSrc: Oral  PainSc: 3       Patients Stated Pain Goal: 5 ([id number])  Complications: No notable events documented.

## 2023-03-02 NOTE — Op Note (Signed)
03/02/2023  9:47 AM  PATIENT:  John Wagner.    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 is a  72 y.o. male with a diagnosis of ANKYLOSIS LEFT KNEE who underwent total knee arthroplasty on 01/07/23  and did well overall however has had stiffness with range of motion notably limited flexion.   The risks benefits and alternatives were discussed with the patient preoperatively including but not limited to the risks of infection, bleeding, nerve injury, cardiopulmonary complications, the need for revision surgery, among others, and the patient was willing to proceed.  ESTIMATED BLOOD LOSS: 0cc  OPERATIVE IMPLANTS: none  OPERATIVE FINDINGS: Improved range of motion 0 to 90 before manipulation and 0 to 130  after the manipulation  OPERATIVE PROCEDURE:  The patient was brought to the operating room.  Anesthesia was induced.  With the patient's supine on the stretcher the knee was assessed and found to be stable to varus valgus stress had full extension but flexion was limited to approximately 90 degrees.   With the hip flexed 90 degrees the knee was gently manipulated by putting pressure at the proximal tibia.  Small crackles were audibly heard as the adhesions were disrupted.  Once the manipulations was completed the range of motion was found to be 0 to 130.  The patient was awoken from anesthesia and taken to the recovery room in stable condition.  Post op recs: WB: WBAT Abx:not indicted Imaging: none Dressing: none DVT prophylaxis: not indicated Follow up: 2 weeks with Dr. Zachery Wagner at [facility].  Address: [address], [place], [place] [address]  Office Phone: [phone]   Charlies Constable,  MD Orthopaedic Surgery

## 2023-03-03 ENCOUNTER — Encounter (HOSPITAL_BASED_OUTPATIENT_CLINIC_OR_DEPARTMENT_OTHER): Payer: Self-pay | Admitting: Orthopedic Surgery

## 2023-03-03 DIAGNOSIS — M6281 Muscle weakness (generalized): Secondary | ICD-10-CM | POA: Diagnosis not present

## 2023-03-03 DIAGNOSIS — M1712 Unilateral primary osteoarthritis, left knee: Secondary | ICD-10-CM | POA: Diagnosis not present

## 2023-03-03 DIAGNOSIS — R262 Difficulty in walking, not elsewhere classified: Secondary | ICD-10-CM | POA: Diagnosis not present

## 2023-03-03 DIAGNOSIS — M25662 Stiffness of left knee, not elsewhere classified: Secondary | ICD-10-CM | POA: Diagnosis not present

## 2023-03-04 DIAGNOSIS — R262 Difficulty in walking, not elsewhere classified: Secondary | ICD-10-CM | POA: Diagnosis not present

## 2023-03-04 DIAGNOSIS — M25662 Stiffness of left knee, not elsewhere classified: Secondary | ICD-10-CM | POA: Diagnosis not present

## 2023-03-04 DIAGNOSIS — M6281 Muscle weakness (generalized): Secondary | ICD-10-CM | POA: Diagnosis not present

## 2023-03-04 DIAGNOSIS — M1712 Unilateral primary osteoarthritis, left knee: Secondary | ICD-10-CM | POA: Diagnosis not present

## 2023-03-05 DIAGNOSIS — R262 Difficulty in walking, not elsewhere classified: Secondary | ICD-10-CM | POA: Diagnosis not present

## 2023-03-05 DIAGNOSIS — M1712 Unilateral primary osteoarthritis, left knee: Secondary | ICD-10-CM | POA: Diagnosis not present

## 2023-03-05 DIAGNOSIS — M25662 Stiffness of left knee, not elsewhere classified: Secondary | ICD-10-CM | POA: Diagnosis not present

## 2023-03-05 DIAGNOSIS — M6281 Muscle weakness (generalized): Secondary | ICD-10-CM | POA: Diagnosis not present

## 2023-03-06 DIAGNOSIS — M25662 Stiffness of left knee, not elsewhere classified: Secondary | ICD-10-CM | POA: Diagnosis not present

## 2023-03-06 DIAGNOSIS — R262 Difficulty in walking, not elsewhere classified: Secondary | ICD-10-CM | POA: Diagnosis not present

## 2023-03-06 DIAGNOSIS — M6281 Muscle weakness (generalized): Secondary | ICD-10-CM | POA: Diagnosis not present

## 2023-03-06 DIAGNOSIS — M1712 Unilateral primary osteoarthritis, left knee: Secondary | ICD-10-CM | POA: Diagnosis not present

## 2023-03-09 DIAGNOSIS — M1712 Unilateral primary osteoarthritis, left knee: Secondary | ICD-10-CM | POA: Diagnosis not present

## 2023-03-09 DIAGNOSIS — R262 Difficulty in walking, not elsewhere classified: Secondary | ICD-10-CM | POA: Diagnosis not present

## 2023-03-09 DIAGNOSIS — M6281 Muscle weakness (generalized): Secondary | ICD-10-CM | POA: Diagnosis not present

## 2023-03-09 DIAGNOSIS — M25662 Stiffness of left knee, not elsewhere classified: Secondary | ICD-10-CM | POA: Diagnosis not present

## 2023-03-10 DIAGNOSIS — R262 Difficulty in walking, not elsewhere classified: Secondary | ICD-10-CM | POA: Diagnosis not present

## 2023-03-10 DIAGNOSIS — M1712 Unilateral primary osteoarthritis, left knee: Secondary | ICD-10-CM | POA: Diagnosis not present

## 2023-03-10 DIAGNOSIS — M6281 Muscle weakness (generalized): Secondary | ICD-10-CM | POA: Diagnosis not present

## 2023-03-10 DIAGNOSIS — M25662 Stiffness of left knee, not elsewhere classified: Secondary | ICD-10-CM | POA: Diagnosis not present

## 2023-03-12 DIAGNOSIS — M6281 Muscle weakness (generalized): Secondary | ICD-10-CM | POA: Diagnosis not present

## 2023-03-12 DIAGNOSIS — M25662 Stiffness of left knee, not elsewhere classified: Secondary | ICD-10-CM | POA: Diagnosis not present

## 2023-03-12 DIAGNOSIS — R262 Difficulty in walking, not elsewhere classified: Secondary | ICD-10-CM | POA: Diagnosis not present

## 2023-03-12 DIAGNOSIS — M1712 Unilateral primary osteoarthritis, left knee: Secondary | ICD-10-CM | POA: Diagnosis not present

## 2023-03-13 DIAGNOSIS — M1712 Unilateral primary osteoarthritis, left knee: Secondary | ICD-10-CM | POA: Diagnosis not present

## 2023-03-16 DIAGNOSIS — M25662 Stiffness of left knee, not elsewhere classified: Secondary | ICD-10-CM | POA: Diagnosis not present

## 2023-03-16 DIAGNOSIS — M1712 Unilateral primary osteoarthritis, left knee: Secondary | ICD-10-CM | POA: Diagnosis not present

## 2023-03-16 DIAGNOSIS — R262 Difficulty in walking, not elsewhere classified: Secondary | ICD-10-CM | POA: Diagnosis not present

## 2023-03-16 DIAGNOSIS — M6281 Muscle weakness (generalized): Secondary | ICD-10-CM | POA: Diagnosis not present

## 2023-03-18 DIAGNOSIS — M6281 Muscle weakness (generalized): Secondary | ICD-10-CM | POA: Diagnosis not present

## 2023-03-18 DIAGNOSIS — M25662 Stiffness of left knee, not elsewhere classified: Secondary | ICD-10-CM | POA: Diagnosis not present

## 2023-03-18 DIAGNOSIS — R262 Difficulty in walking, not elsewhere classified: Secondary | ICD-10-CM | POA: Diagnosis not present

## 2023-03-18 DIAGNOSIS — M1712 Unilateral primary osteoarthritis, left knee: Secondary | ICD-10-CM | POA: Diagnosis not present

## 2023-03-24 DIAGNOSIS — R262 Difficulty in walking, not elsewhere classified: Secondary | ICD-10-CM | POA: Diagnosis not present

## 2023-03-24 DIAGNOSIS — M6281 Muscle weakness (generalized): Secondary | ICD-10-CM | POA: Diagnosis not present

## 2023-03-24 DIAGNOSIS — M25662 Stiffness of left knee, not elsewhere classified: Secondary | ICD-10-CM | POA: Diagnosis not present

## 2023-03-24 DIAGNOSIS — M1712 Unilateral primary osteoarthritis, left knee: Secondary | ICD-10-CM | POA: Diagnosis not present

## 2023-03-27 DIAGNOSIS — R262 Difficulty in walking, not elsewhere classified: Secondary | ICD-10-CM | POA: Diagnosis not present

## 2023-03-27 DIAGNOSIS — M6281 Muscle weakness (generalized): Secondary | ICD-10-CM | POA: Diagnosis not present

## 2023-03-27 DIAGNOSIS — M25662 Stiffness of left knee, not elsewhere classified: Secondary | ICD-10-CM | POA: Diagnosis not present

## 2023-03-27 DIAGNOSIS — M1712 Unilateral primary osteoarthritis, left knee: Secondary | ICD-10-CM | POA: Diagnosis not present

## 2023-03-30 DIAGNOSIS — M6281 Muscle weakness (generalized): Secondary | ICD-10-CM | POA: Diagnosis not present

## 2023-03-30 DIAGNOSIS — R262 Difficulty in walking, not elsewhere classified: Secondary | ICD-10-CM | POA: Diagnosis not present

## 2023-03-30 DIAGNOSIS — M25662 Stiffness of left knee, not elsewhere classified: Secondary | ICD-10-CM | POA: Diagnosis not present

## 2023-03-30 DIAGNOSIS — M1712 Unilateral primary osteoarthritis, left knee: Secondary | ICD-10-CM | POA: Diagnosis not present

## 2023-04-02 DIAGNOSIS — M25662 Stiffness of left knee, not elsewhere classified: Secondary | ICD-10-CM | POA: Diagnosis not present

## 2023-04-02 DIAGNOSIS — R262 Difficulty in walking, not elsewhere classified: Secondary | ICD-10-CM | POA: Diagnosis not present

## 2023-04-02 DIAGNOSIS — M1712 Unilateral primary osteoarthritis, left knee: Secondary | ICD-10-CM | POA: Diagnosis not present

## 2023-04-02 DIAGNOSIS — M6281 Muscle weakness (generalized): Secondary | ICD-10-CM | POA: Diagnosis not present

## 2023-04-06 DIAGNOSIS — M1712 Unilateral primary osteoarthritis, left knee: Secondary | ICD-10-CM | POA: Diagnosis not present

## 2023-04-06 DIAGNOSIS — M25662 Stiffness of left knee, not elsewhere classified: Secondary | ICD-10-CM | POA: Diagnosis not present

## 2023-04-06 DIAGNOSIS — R262 Difficulty in walking, not elsewhere classified: Secondary | ICD-10-CM | POA: Diagnosis not present

## 2023-04-06 DIAGNOSIS — M6281 Muscle weakness (generalized): Secondary | ICD-10-CM | POA: Diagnosis not present

## 2023-04-06 DIAGNOSIS — C61 Malignant neoplasm of prostate: Secondary | ICD-10-CM | POA: Diagnosis not present

## 2023-04-08 DIAGNOSIS — R262 Difficulty in walking, not elsewhere classified: Secondary | ICD-10-CM | POA: Diagnosis not present

## 2023-04-08 DIAGNOSIS — M6281 Muscle weakness (generalized): Secondary | ICD-10-CM | POA: Diagnosis not present

## 2023-04-08 DIAGNOSIS — M1712 Unilateral primary osteoarthritis, left knee: Secondary | ICD-10-CM | POA: Diagnosis not present

## 2023-04-08 DIAGNOSIS — M25662 Stiffness of left knee, not elsewhere classified: Secondary | ICD-10-CM | POA: Diagnosis not present

## 2023-04-13 DIAGNOSIS — R3912 Poor urinary stream: Secondary | ICD-10-CM | POA: Diagnosis not present

## 2023-04-13 DIAGNOSIS — N401 Enlarged prostate with lower urinary tract symptoms: Secondary | ICD-10-CM | POA: Diagnosis not present

## 2023-04-13 DIAGNOSIS — C61 Malignant neoplasm of prostate: Secondary | ICD-10-CM | POA: Diagnosis not present

## 2023-04-28 DIAGNOSIS — M1712 Unilateral primary osteoarthritis, left knee: Secondary | ICD-10-CM | POA: Diagnosis not present

## 2023-05-26 DIAGNOSIS — H2513 Age-related nuclear cataract, bilateral: Secondary | ICD-10-CM | POA: Diagnosis not present

## 2023-05-26 DIAGNOSIS — H524 Presbyopia: Secondary | ICD-10-CM | POA: Diagnosis not present

## 2023-05-28 ENCOUNTER — Ambulatory Visit (INDEPENDENT_AMBULATORY_CARE_PROVIDER_SITE_OTHER): Payer: PPO | Admitting: *Deleted

## 2023-05-28 ENCOUNTER — Encounter: Payer: Self-pay | Admitting: Family Medicine

## 2023-05-28 VITALS — Ht 66.0 in | Wt 175.0 lb

## 2023-05-28 DIAGNOSIS — Z0000 Encounter for general adult medical examination without abnormal findings: Secondary | ICD-10-CM

## 2023-05-28 NOTE — Progress Notes (Signed)
Subjective:   John Wagner. is a 72 y.o. male who presents for Medicare Annual/Subsequent preventive examination.  I connected with  John Wagner. on 05/28/23 by a audio enabled telemedicine application and verified that I am speaking with the correct person using two identifiers.  Patient Location: Home  Provider Location: Office/Clinic  I discussed the limitations of evaluation and management by telemedicine. The patient expressed understanding and agreed to proceed.   Review of Systems     Cardiac Risk Factors include: male gender;advanced age (>83men, >58 women);dyslipidemia;hypertension     Objective:    Today's Vitals   05/28/23 0930  Weight: 175 lb (79.4 kg)  Height: 5\' 6"  (1.676 m)   Body mass index is 28.25 kg/m.     05/28/2023    9:43 AM 03/02/2023    6:10 AM 02/12/2022   11:00 AM 01/21/2022   12:11 PM 11/07/2021    9:43 AM 10/25/2021    1:50 PM 08/29/2021    9:18 AM  Advanced Directives  Does Patient Have a Medical Advance Directive? Yes Yes Yes Yes No Yes Yes  Type of Estate agent of [place];Living will Healthcare Power of [place];Living will Living will;Healthcare Power of Attorney Living will  Living will Living will  Does patient want to make changes to medical advance directive? No - Patient declined   No - Patient declined     Copy of Healthcare Power of Attorney in Chart? No - copy requested No - copy requested       Would patient like information on creating a medical advance directive?     Yes (ED - Information included in AVS)      Current Medications (verified) Outpatient Encounter Medications as of 05/28/2023  Medication Sig   acyclovir (ZOVIRAX) 400 MG tablet TAKE ONE TABLET BY MOUTH THREE TIMES A DAY AS NEEDED FOR OUTBREAKS   ALPRAZolam (XANAX) 1 MG tablet TAKE 1/2 TABLET BY MOUTH AT NOON AND 1/2 TABLET EVERY NIGHT AT BEDTIME. MAY TAKE A WHOLE TABLET IF NEEDED ON OCCASION (Patient taking differently: Take 0.5 mg by mouth  2 (two) times daily. TAKE 1/2 TABLET BY MOUTH AT NOON AND 1/2 TABLET EVERY NIGHT AT BEDTIME. MAY TAKE A WHOLE TABLET IF NEEDED ON OCCASION)   betamethasone valerate lotion (VALISONE) 0.1 % Apply 1 application  topically 2 (two) times daily as needed.   hydrOXYzine (ATARAX/VISTARIL) 25 MG tablet Take 1 tablet (25 mg total) by mouth every 8 (eight) hours as needed for itching.   lisinopril (ZESTRIL) 5 MG tablet TAKE ONE TABLET BY MOUTH DAILY (Patient taking differently: Take 5 mg by mouth daily.)   propranolol (INDERAL) 10 MG tablet TAKE 1 TABLET BY MOUTH TWO TIMES A DAY AS NEEDED FOR PERFORMANCE ANXIETY (Patient taking differently: Take 10 mg by mouth 2 (two) times daily as needed. TAKE 1 TABLET BY MOUTH TWO TIMES A DAY AS NEEDED FOR PERFORMANCE ANXIETY)   REPATHA SURECLICK 140 MG/ML SOAJ INJECT 140MG S INTO THE SKIN EVERY 14 DAYS   tadalafil (CIALIS) 20 MG tablet Take 1 tablet (20 mg total) by mouth daily as needed for erectile dysfunction.   tamsulosin (FLOMAX) 0.4 MG CAPS capsule Take 1 capsule (0.4 mg total) by mouth daily after supper. (Patient taking differently: Take 0.4 mg by mouth daily after supper.)   Trolamine Salicylate (ASPERCREME EX) Apply topically as needed.   No facility-administered encounter medications on file as of 05/28/2023.    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   urologist--- dr newsome/  radiation oncologist--- dr Kathrynn Running;  dx 10/ 2022, Gleason, 3+4;   01-21-2022  radioactive prostate seed implants   Postural dizziness with  presyncope 07/2021   cardiologist evalution by dr Servando Salina;  tested results in epic ---- event monitor 07-24-2021  shows SR w/ rare PAC/ PVC;  echo 08-13-2021  ef 55-60%, GIDD, mild MR/ AR,, AA 3.9cm,  ETT 08-13-2017 no ischemia   Pre-diabetes    Psoriasis    Past Surgical History:  Procedure Laterality Date   APPENDECTOMY     age 41   COLONOSCOPY WITH PROPOFOL  09/30/2022   dr Marca Ancona   JOINT REPLACEMENT  01/07/2023   KNEE CLOSED REDUCTION Left 03/02/2023   Procedure: CLOSED MANIPULATION KNEE;  Surgeon: Joen Laura, MD;  Location: [facility];  Service: Orthopedics;  Laterality: Left;   RADIOACTIVE SEED IMPLANT N/A 01/21/2022   Procedure: RADIOACTIVE SEED IMPLANT/BRACHYTHERAPY IMPLANT;  Surgeon: Belva Agee, MD;  Location: [facility];  Service: Urology;  Laterality: N/A;  90 MINS   SPACE OAR INSTILLATION N/A 01/21/2022   Procedure: SPACE OAR INSTILLATION;  Surgeon: Belva Agee, MD;  Location: [facility];  Service: Urology;  Laterality: N/A;   TOTAL KNEE ARTHROPLASTY Left 01/07/2023   @ [facility] by dr Blanchie Dessert   Family History  Problem Relation Age of Onset   Cancer Father        prostate cancer   Hypertension Mother    Heart disease Mother    Alcohol abuse Mother    Social History   Socioeconomic History   Marital status: Single    Spouse name: Not on file   Number of children: Not on file   Years of education: Not on file   Highest education level: Not on file  Occupational History   Occupation: picture frame/artist    Employer: [employer]  Tobacco Use   Smoking status: Former   Smokeless tobacco: Never   Tobacco comments:    yuck!  Substance and Sexual Activity   Alcohol use: Yes    Alcohol/week: 5.0 standard drinks of alcohol    Types: 3 Glasses of wine, 2 Standard drinks or equivalent per week   Drug use: No   Sexual activity: Yes    Birth control/protection: Condom  Other Topics Concern   Not on  file  Social History Narrative   Not on file   Social Determinants of Health   Financial Resource Strain: Low Risk  (05/28/2023)   Overall Financial Resource Strain (CARDIA)    Difficulty of Paying Living Expenses: Not hard at all  Food Insecurity: No Food Insecurity (05/28/2023)   Hunger Vital Sign    Worried About Running Out of Food in the Last Year: Never true    Ran Out of Food in the Last Year: Never true  Transportation Needs: No Transportation Needs (05/28/2023)   PRAPARE - Administrator, Civil Service (Medical): No    Lack of Transportation (Non-Medical):  No  Physical Activity: Insufficiently Active (05/28/2023)   Exercise Vital Sign    Days of Exercise per Week: 4 days    Minutes of Exercise per Session: 30 min  Stress: No Stress Concern Present (05/28/2023)   [employer] of Occupational Health - Occupational Stress Questionnaire    Feeling of Stress : Not at all  Social Connections: Unknown (05/28/2023)   Social Connection and Isolation Panel [NHANES]    Frequency of Communication with Friends and Family: Three times a week    Frequency of Social Gatherings with Friends and Family: Three times a week    Attends Religious Services: Not on file    Active Member of Clubs or Organizations: Yes    Attends Banker Meetings: More than 4 times per year    Marital Status: Divorced    Tobacco Counseling Counseling given: Not Answered Tobacco comments: yuck!   Clinical Intake:  Pre-visit preparation completed: Yes  Pain : No/denies pain  BMI - recorded: 28.25 Nutritional Status: BMI 25 -29 Overweight Nutritional Risks: None Diabetes: No  How often do you need to have someone help you when you read instructions, pamphlets, or other written materials from your doctor or pharmacy?: 1 - Never   Activities of Daily Living    05/28/2023    9:17 AM 03/02/2023    6:13 AM  In your present state of health, do you have any difficulty performing the  following activities:  Hearing? 0 0  Vision? 0 0  Difficulty concentrating or making decisions? 0 0  Walking or climbing stairs? 0 0  Dressing or bathing? 0 0  Doing errands, shopping? 0   Preparing Food and eating ? N   Using the Toilet? N   In the past six months, have you accidently leaked urine? Y   Do you have problems with loss of bowel control? N   Managing your Medications? N   Managing your Finances? N   Housekeeping or managing your Housekeeping? N     Patient Care Team: Copland, Gwenlyn Found, MD as PCP - General (Family Medicine) Thomasene Ripple, DO as PCP - Cardiology (Cardiology) Ihor Gully, MD (Inactive) as Consulting Physician (Urology) Cherlyn Cushing, RN as Oncology Nurse Navigator Belva Agee, MD as Consulting Physician (Urology) Margaretmary Dys, MD as Consulting Physician (Radiation Oncology) Axel Filler Larna Daughters, NP as Nurse Practitioner (Hematology and Oncology) Maryclare Labrador, RN as Registered Nurse  Indicate any recent Medical Services you may have received from other than [facility] providers in the past year (date may be approximate).     Assessment:   This is a routine wellness examination for [place].  Hearing/Vision screen No results found.  Dietary issues and exercise activities discussed: Current Exercise Habits: Home exercise routine, Type of exercise: stretching, Time (Minutes): 30, Frequency (Times/Week): 4, Weekly Exercise (Minutes/Week): 120, Intensity: Mild, Exercise limited by: orthopedic condition(s) (recent knee replacement (March 2024))   Goals Addressed   None    Depression Screen    05/28/2023    9:51 AM 01/05/2023   11:10 AM 01/13/2022    9:34 AM 08/29/2021    9:24 AM 07/10/2021    1:53 PM 03/08/2021   11:19 AM 08/06/2020    9:44 AM  PHQ 2/9 Scores  PHQ - 2 Score 0 1 1 1  0 0 0  PHQ- 9 Score  2 5  0 0     Fall Risk    05/28/2023    9:17 AM 01/05/2023   11:10 AM 08/29/2021  9:22 AM 07/10/2021    1:53 PM 08/06/2020    9:42 AM  Fall  Risk   Falls in the past year? 0 0 0 0 0  Number falls in past yr: 0 0 0 0   Injury with Fall? 0 0 0 0   Risk for fall due to : No Fall Risks No Fall Risks     Follow up Falls evaluation completed Falls evaluation completed Falls prevention discussed      FALL RISK PREVENTION PERTAINING TO THE HOME:  Any stairs in or around the home? Yes  If so, are there any without handrails? No  Home free of loose throw rugs in walkways, pet beds, electrical cords, etc? Yes  Adequate lighting in your home to reduce risk of falls? Yes   ASSISTIVE DEVICES UTILIZED TO PREVENT FALLS:  Life alert? No  Use of a cane, walker or w/c? No  Grab bars in the bathroom? No  Shower chair or bench in shower? No  Elevated toilet seat or a handicapped toilet? No   TIMED UP AND GO:  Was the test performed?  No, audio visit .   Cognitive Function:        05/28/2023    9:58 AM  6CIT Screen  What Year? 0 points  What month? 0 points  What time? 0 points  Count back from 20 0 points  Months in reverse 0 points  Repeat phrase 0 points  Total Score 0 points    Immunizations Immunization History  Administered Date(s) Administered   Fluad Quad(high Dose 65+) 02/07/2021, 01/13/2022   Hepatitis B 08/04/1999, 09/04/1999, 02/20/2000   Influenza, High Dose Seasonal PF 02/09/2019   Influenza,inj,Quad PF,6+ Mos 01/26/2017, 02/03/2018, 01/30/2020   PFIZER(Purple Top)SARS-COV-2 Vaccination 02/17/2020, 03/13/2020, 12/18/2020   Pfizer Covid-19 Vaccine Bivalent Booster 31yrs & up 07/14/2022   Pneumococcal Conjugate-13 08/04/2018   Pneumococcal Polysaccharide-23 01/30/2020   Td 02/07/2021   Tdap 06/24/2010    TDAP status: Up to date  Flu Vaccine status: Up to date  Pneumococcal vaccine status: Up to date  Covid-19 vaccine status: Information provided on how to obtain vaccines.   Qualifies for Shingles Vaccine? Yes   Zostavax completed No   Shingrix Completed?: No.    Education has been provided regarding  the importance of this vaccine. Patient has been advised to call insurance company to determine out of pocket expense if they have not yet received this vaccine. Advised may also receive vaccine at local pharmacy or Health Dept. Verbalized acceptance and understanding.  Screening Tests Health Maintenance  Topic Date Due   Zoster Vaccines- Shingrix (1 of 2) Never done   Medicare Annual Wellness (AWV)  08/29/2022   COVID-19 Vaccine (5 - 2023-24 season) 09/08/2022   INFLUENZA VACCINE  07/23/2023   Fecal DNA (Cologuard)  08/25/2023   DTaP/Tdap/Td (3 - Td or Tdap) 02/07/2031   Pneumonia Vaccine 36+ Years old  Completed   Hepatitis C Screening  Completed   HPV VACCINES  Aged Out   Colonoscopy  Discontinued    Health Maintenance  Health Maintenance Due  Topic Date Due   Zoster Vaccines- Shingrix (1 of 2) Never done   Medicare Annual Wellness (AWV)  08/29/2022   COVID-19 Vaccine (5 - 2023-24 season) 09/08/2022    Colorectal cancer screening: Type of screening: Cologuard. Completed 08/24/20. Repeat every 3 years  Lung Cancer Screening: (Low Dose CT Chest recommended if Age 54-80 years, 30 pack-year currently smoking OR have quit w/in 15years.) does not qualify.  Additional Screening:  Hepatitis C Screening: does qualify; Completed 12/25/14  Vision Screening: Recommended annual ophthalmology exams for early detection of glaucoma and other disorders of the eye. Is the patient up to date with their annual eye exam?  Yes  Who is the provider or what is the name of the office in which the patient attends annual eye exams? Dr. Nile Riggs If pt is not established with a provider, would they like to be referred to a provider to establish care? No .   Dental Screening: Recommended annual dental exams for proper oral hygiene  Community Resource Referral / Chronic Care Management: CRR required this visit?  No   CCM required this visit?  No      Plan:     I have personally reviewed and  noted the following in the patient's chart:   Medical and social history Use of alcohol, tobacco or illicit drugs  Current medications and supplements including opioid prescriptions. Patient is not currently taking opioid prescriptions. Functional ability and status Nutritional status Physical activity Advanced directives List of other physicians Hospitalizations, surgeries, and ER visits in previous 12 months Vitals Screenings to include cognitive, depression, and falls Referrals and appointments  In addition, I have reviewed and discussed with patient certain preventive protocols, quality metrics, and best practice recommendations. A written personalized care plan for preventive services as well as general preventive health recommendations were provided to patient.   Due to this being a telephonic visit, the after visit summary with patients personalized plan was offered to patient via mail or my-chart. Patient would like to access on my-chart.   Donne Anon, [place]   05/28/2023   Nurse Notes: None

## 2023-05-28 NOTE — Patient Instructions (Signed)
[name] , Thank you for taking time to come for your Medicare Wellness Visit. I appreciate your ongoing commitment to your health goals. Please review the following plan we discussed and let me know if I can assist you in the future.   These are the goals we discussed:  Goals      Patient Stated     Would like to lose some weight        This is a list of the screening recommended for you and due dates:  Health Maintenance  Topic Date Due   Zoster (Shingles) Vaccine (1 of 2) Never done   COVID-19 Vaccine (5 - 2023-24 season) 09/08/2022   Flu Shot  07/23/2023   Cologuard (Stool DNA test)  08/25/2023   Medicare Annual Wellness Visit  05/27/2024   DTaP/Tdap/Td vaccine (3 - Td or Tdap) 02/07/2031   Pneumonia Vaccine  Completed   Hepatitis C Screening  Completed   HPV Vaccine  Aged Out   Colon Cancer Screening  Discontinued     Next appointment: Follow up in one year for your annual wellness visit.   Preventive Care 70 Years and Older, Male Preventive care refers to lifestyle choices and visits with your health care provider that can promote health and wellness. What does preventive care include? A yearly physical exam. This is also called an annual well check. Dental exams once or twice a year. Routine eye exams. Ask your health care provider how often you should have your eyes checked. Personal lifestyle choices, including: Daily care of your teeth and gums. Regular physical activity. Eating a healthy diet. Avoiding tobacco and drug use. Limiting alcohol use. Practicing safe sex. Taking low doses of aspirin every day. Taking vitamin and mineral supplements as recommended by your health care provider. What happens during an annual well check? The services and screenings done by your health care provider during your annual well check will depend on your age, overall health, lifestyle risk factors, and family history of disease. Counseling  Your health care provider may  ask you questions about your: Alcohol use. Tobacco use. Drug use. Emotional well-being. Home and relationship well-being. Sexual activity. Eating habits. History of falls. Memory and ability to understand (cognition). Work and work Astronomer. Screening  You may have the following tests or measurements: Height, weight, and BMI. Blood pressure. Lipid and cholesterol levels. These may be checked every 5 years, or more frequently if you are over 25 years old. Skin check. Lung cancer screening. You may have this screening every year starting at age 74 if you have a 30-pack-year history of smoking and currently smoke or have quit within the past 15 years. Fecal occult blood test (FOBT) of the stool. You may have this test every year starting at age 35. Flexible sigmoidoscopy or colonoscopy. You may have a sigmoidoscopy every 5 years or a colonoscopy every 10 years starting at age 42. Prostate cancer screening. Recommendations will vary depending on your family history and other risks. Hepatitis C blood test. Hepatitis B blood test. Sexually transmitted disease (STD) testing. Diabetes screening. This is done by checking your blood sugar (glucose) after you have not eaten for a while (fasting). You may have this done every 1-3 years. Abdominal aortic aneurysm (AAA) screening. You may need this if you are a current or former smoker. Osteoporosis. You may be screened starting at age 59 if you are at high risk. Talk with your health care provider about your test results, treatment options, and if  necessary, the need for more tests. Vaccines  Your health care provider may recommend certain vaccines, such as: Influenza vaccine. This is recommended every year. Tetanus, diphtheria, and acellular pertussis (Tdap, Td) vaccine. You may need a Td booster every 10 years. Zoster vaccine. You may need this after age 62. Pneumococcal 13-valent conjugate (PCV13) vaccine. One dose is recommended after age  46. Pneumococcal polysaccharide (PPSV23) vaccine. One dose is recommended after age 61. Talk to your health care provider about which screenings and vaccines you need and how often you need them. This information is not intended to replace advice given to you by your health care provider. Make sure you discuss any questions you have with your health care provider. Document Released: 01/04/2016 Document Revised: 08/27/2016 Document Reviewed: 10/09/2015 Elsevier Interactive Patient Education  2017 [employer].  Fall Prevention in the Home Falls can cause injuries. They can happen to people of all ages. There are many things you can do to make your home safe and to help prevent falls. What can I do on the outside of my home? Regularly fix the edges of walkways and driveways and fix any cracks. Remove anything that might make you trip as you walk through a door, such as a raised step or threshold. Trim any bushes or trees on the path to your home. Use bright outdoor lighting. Clear any walking paths of anything that might make someone trip, such as rocks or tools. Regularly check to see if handrails are loose or broken. Make sure that both sides of any steps have handrails. Any raised decks and porches should have guardrails on the edges. Have any leaves, snow, or ice cleared regularly. Use sand or salt on walking paths during winter. Clean up any spills in your garage right away. This includes oil or grease spills. What can I do in the bathroom? Use night lights. Install grab bars by the toilet and in the tub and shower. Do not use towel bars as grab bars. Use non-skid mats or decals in the tub or shower. If you need to sit down in the shower, use a plastic, non-slip stool. Keep the floor dry. Clean up any water that spills on the floor as soon as it happens. Remove soap buildup in the tub or shower regularly. Attach bath mats securely with double-sided non-slip rug tape. Do not have throw  rugs and other things on the floor that can make you trip. What can I do in the bedroom? Use night lights. Make sure that you have a light by your bed that is easy to reach. Do not use any sheets or blankets that are too big for your bed. They should not hang down onto the floor. Have a firm chair that has side arms. You can use this for support while you get dressed. Do not have throw rugs and other things on the floor that can make you trip. What can I do in the kitchen? Clean up any spills right away. Avoid walking on wet floors. Keep items that you use a lot in easy-to-reach places. If you need to reach something above you, use a strong step stool that has a grab bar. Keep electrical cords out of the way. Do not use floor polish or wax that makes floors slippery. If you must use wax, use non-skid floor wax. Do not have throw rugs and other things on the floor that can make you trip. What can I do with my stairs? Do not leave any items  on the stairs. Make sure that there are handrails on both sides of the stairs and use them. Fix handrails that are broken or loose. Make sure that handrails are as long as the stairways. Check any carpeting to make sure that it is firmly attached to the stairs. Fix any carpet that is loose or worn. Avoid having throw rugs at the top or bottom of the stairs. If you do have throw rugs, attach them to the floor with carpet tape. Make sure that you have a light switch at the top of the stairs and the bottom of the stairs. If you do not have them, ask someone to add them for you. What else can I do to help prevent falls? Wear shoes that: Do not have high heels. Have rubber bottoms. Are comfortable and fit you well. Are closed at the toe. Do not wear sandals. If you use a stepladder: Make sure that it is fully opened. Do not climb a closed stepladder. Make sure that both sides of the stepladder are locked into place. Ask someone to hold it for you, if  possible. Clearly mark and make sure that you can see: Any grab bars or handrails. First and last steps. Where the edge of each step is. Use tools that help you move around (mobility aids) if they are needed. These include: Canes. Walkers. Scooters. Crutches. Turn on the lights when you go into a dark area. Replace any light bulbs as soon as they burn out. Set up your furniture so you have a clear path. Avoid moving your furniture around. If any of your floors are uneven, fix them. If there are any pets around you, be aware of where they are. Review your medicines with your doctor. Some medicines can make you feel dizzy. This can increase your chance of falling. Ask your doctor what other things that you can do to help prevent falls. This information is not intended to replace advice given to you by your health care provider. Make sure you discuss any questions you have with your health care provider. Document Released: 10/04/2009 Document Revised: 05/15/2016 Document Reviewed: 01/12/2015 Elsevier Interactive Patient Education  2017 [employer].

## 2023-06-17 ENCOUNTER — Other Ambulatory Visit: Payer: Self-pay | Admitting: Family Medicine

## 2023-06-17 DIAGNOSIS — G8929 Other chronic pain: Secondary | ICD-10-CM

## 2023-07-04 NOTE — Patient Instructions (Addendum)
It was good to see you again today, assuming all is well please see me in about 6 months Recommend shingles vaccine if not done already, this should be given at your pharmacy as you are Medicare patient  I will be in touch with your labs asap
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
## 2023-07-13 ENCOUNTER — Encounter: Payer: Self-pay | Admitting: Family Medicine

## 2023-07-13 ENCOUNTER — Ambulatory Visit (INDEPENDENT_AMBULATORY_CARE_PROVIDER_SITE_OTHER): Payer: PPO | Admitting: Family Medicine

## 2023-07-13 VITALS — BP 110/70 | HR 81 | Temp 98.0°F | Resp 18 | Ht 66.5 in | Wt 180.8 lb

## 2023-07-13 DIAGNOSIS — R7303 Prediabetes: Secondary | ICD-10-CM

## 2023-07-13 DIAGNOSIS — G8929 Other chronic pain: Secondary | ICD-10-CM | POA: Diagnosis not present

## 2023-07-13 DIAGNOSIS — R5383 Other fatigue: Secondary | ICD-10-CM

## 2023-07-13 DIAGNOSIS — E785 Hyperlipidemia, unspecified: Secondary | ICD-10-CM

## 2023-07-13 DIAGNOSIS — M25569 Pain in unspecified knee: Secondary | ICD-10-CM | POA: Diagnosis not present

## 2023-07-13 DIAGNOSIS — C61 Malignant neoplasm of prostate: Secondary | ICD-10-CM | POA: Diagnosis not present

## 2023-07-13 DIAGNOSIS — I10 Essential (primary) hypertension: Secondary | ICD-10-CM

## 2023-07-13 DIAGNOSIS — F411 Generalized anxiety disorder: Secondary | ICD-10-CM | POA: Diagnosis not present

## 2023-07-13 LAB — VITAMIN D 25 HYDROXY (VIT D DEFICIENCY, FRACTURES): VITD: 24.61 ng/mL — ABNORMAL LOW (ref 30.00–100.00)

## 2023-07-13 LAB — COMPREHENSIVE METABOLIC PANEL
ALT: 16 U/L (ref 0–53)
AST: 17 U/L (ref 0–37)
Albumin: 4.2 g/dL (ref 3.5–5.2)
Alkaline Phosphatase: 56 U/L (ref 39–117)
BUN: 14 mg/dL (ref 6–23)
CO2: 28 mEq/L (ref 19–32)
Calcium: 9.3 mg/dL (ref 8.4–10.5)
Chloride: 102 mEq/L (ref 96–112)
Creatinine, Ser: 0.98 mg/dL (ref 0.40–1.50)
GFR: 78.2 mL/min (ref >60.00)
Glucose, Bld: 90 mg/dL (ref 70–99)
Potassium: 4.4 mEq/L (ref 3.5–5.1)
Sodium: 139 mEq/L (ref 135–145)
Total Bilirubin: 0.7 mg/dL (ref 0.2–1.2)
Total Protein: 6.5 g/dL (ref 6.0–8.3)

## 2023-07-13 LAB — TSH: TSH: 1.75 u[IU]/mL (ref 0.35–5.50)

## 2023-07-13 LAB — VITAMIN B12: Vitamin B-12: 305 pg/mL (ref 211–911)

## 2023-07-13 LAB — PSA: PSA: 1.27 ng/mL (ref 0.10–4.00)

## 2023-07-13 LAB — CBC
HCT: 44.3 % (ref 39.0–52.0)
Hemoglobin: 14.6 g/dL (ref 13.0–17.0)
MCHC: 33 g/dL (ref 30.0–36.0)
MCV: 84.3 fl (ref 78.0–100.0)
Platelets: 406 10*3/uL — ABNORMAL HIGH (ref 150.0–400.0)
RBC: 5.25 Mil/uL (ref 4.22–5.81)
RDW: 14.7 % (ref 11.5–15.5)
WBC: 5.9 10*3/uL (ref 4.0–10.5)

## 2023-07-13 LAB — HEMOGLOBIN A1C: Hgb A1c MFr Bld: 6.2 % (ref 4.6–6.5)

## 2023-07-13 LAB — FERRITIN: Ferritin: 71.7 ng/mL (ref 22.0–322.0)

## 2023-07-13 MED ORDER — ALPRAZOLAM 1 MG PO TABS: ORAL_TABLET | ORAL | 2 refills | Status: DC

## 2023-07-30 DIAGNOSIS — M1712 Unilateral primary osteoarthritis, left knee: Secondary | ICD-10-CM | POA: Diagnosis not present

## 2023-08-21 ENCOUNTER — Other Ambulatory Visit: Payer: Self-pay | Admitting: Cardiology

## 2023-09-16 ENCOUNTER — Other Ambulatory Visit: Payer: Self-pay | Admitting: Family Medicine

## 2023-09-16 DIAGNOSIS — G8929 Other chronic pain: Secondary | ICD-10-CM

## 2023-09-16 NOTE — Telephone Encounter (Signed)
Requesting: Tramadol 50mg  Contract: N/A UDS: N/A Last Visit: 07/13/2023 Next Visit: 01/13/2024 Last Refill: 06/17/2023  Please Advise

## 2023-09-17 DIAGNOSIS — D485 Neoplasm of uncertain behavior of skin: Secondary | ICD-10-CM | POA: Diagnosis not present

## 2023-09-17 DIAGNOSIS — L821 Other seborrheic keratosis: Secondary | ICD-10-CM | POA: Diagnosis not present

## 2023-09-17 DIAGNOSIS — L400 Psoriasis vulgaris: Secondary | ICD-10-CM | POA: Diagnosis not present

## 2023-09-17 DIAGNOSIS — L570 Actinic keratosis: Secondary | ICD-10-CM | POA: Diagnosis not present

## 2023-09-17 DIAGNOSIS — L820 Inflamed seborrheic keratosis: Secondary | ICD-10-CM | POA: Diagnosis not present

## 2023-09-21 ENCOUNTER — Other Ambulatory Visit: Payer: Self-pay | Admitting: Family Medicine

## 2023-09-21 DIAGNOSIS — I10 Essential (primary) hypertension: Secondary | ICD-10-CM

## 2023-10-07 DIAGNOSIS — C61 Malignant neoplasm of prostate: Secondary | ICD-10-CM | POA: Diagnosis not present

## 2023-10-14 DIAGNOSIS — R3912 Poor urinary stream: Secondary | ICD-10-CM | POA: Diagnosis not present

## 2023-10-14 DIAGNOSIS — N401 Enlarged prostate with lower urinary tract symptoms: Secondary | ICD-10-CM | POA: Diagnosis not present

## 2023-10-14 DIAGNOSIS — C61 Malignant neoplasm of prostate: Secondary | ICD-10-CM | POA: Diagnosis not present

## 2023-10-28 ENCOUNTER — Encounter: Payer: Self-pay | Admitting: Family Medicine

## 2023-11-30 ENCOUNTER — Encounter: Payer: Self-pay | Admitting: Family Medicine

## 2023-11-30 DIAGNOSIS — C61 Malignant neoplasm of prostate: Secondary | ICD-10-CM

## 2023-12-03 DIAGNOSIS — M1712 Unilateral primary osteoarthritis, left knee: Secondary | ICD-10-CM | POA: Diagnosis not present

## 2023-12-06 ENCOUNTER — Other Ambulatory Visit: Payer: Self-pay | Admitting: Family Medicine

## 2023-12-06 DIAGNOSIS — F418 Other specified anxiety disorders: Secondary | ICD-10-CM

## 2023-12-06 DIAGNOSIS — F411 Generalized anxiety disorder: Secondary | ICD-10-CM

## 2023-12-19 ENCOUNTER — Other Ambulatory Visit: Payer: Self-pay | Admitting: Family Medicine

## 2023-12-19 DIAGNOSIS — G8929 Other chronic pain: Secondary | ICD-10-CM

## 2023-12-24 ENCOUNTER — Encounter: Payer: Self-pay | Admitting: Family Medicine

## 2023-12-24 DIAGNOSIS — L299 Pruritus, unspecified: Secondary | ICD-10-CM

## 2023-12-24 MED ORDER — HYDROXYZINE HCL 25 MG PO TABS: 25.0000 mg | ORAL_TABLET | Freq: Three times a day (TID) | ORAL | 0 refills | Status: DC | PRN

## 2024-01-10 NOTE — Progress Notes (Unsigned)
[facility] [address] [place], [place] [address] [phone]  Date:  01/13/2024   Name:  John Wagner.   DOB:  07-03-1951   MRN:  [id number]  PCP:  Pearline Cables, MD    Chief Complaint: No chief complaint on file.   History of Present Illness:  John Wagner. is a 73 y.o. very pleasant male patient who presents with the following:  Pt seen today for periodic recheck Last seen by myself in July   History of prediabetes, BPH, hypertension, hyperlipidemia, anxiety treated with xanax, joint pain, prostate cancer diagnosed 2022 and treated with radioactive seed implantation    Prostate cancer is considered to be in remission, monitored by urology-most recent [facility] on [facility] October 2024  Covid booster [facility] 7/24    Patient Active Problem List   Diagnosis Date Noted   Prediabetes 02/11/2022   Overweight (BMI 25.0-29.9) 02/11/2022   Malignant neoplasm of prostate (HCC) 10/25/2021   Elevated PSA    BPH (benign prostatic hyperplasia) 12/25/2014   Erectile dysfunction 03/10/2014   Anxiety 12/06/2012   Psoriasis 12/06/2012   Hypertension    Hyperlipidemia     Past Medical History:  Diagnosis Date   Allergy    Ankylosis, left knee    post TKA   Arthritis    BPH (benign prostatic hyperplasia)    Complication of anesthesia    per pt causes memory issues post op   ED (erectile dysfunction)    GAD (generalized anxiety disorder)    History of adenomatous polyp of colon    Hyperlipidemia    Hypertension    Malignant neoplasm prostate (HCC) 09/2021   urologist--- dr newsome/  radiation oncologist--- dr Kathrynn Running;  dx 10/ 2022, Gleason, 3+4;   01-21-2022  radioactive prostate seed implants   Postural dizziness with presyncope 07/2021   cardiologist evalution by dr Servando Salina;  tested results in epic ---- event monitor 07-24-2021  shows SR w/ rare PAC/ PVC;  echo 08-13-2021  ef 55-60%,  GIDD, mild MR/ AR,, AA 3.9cm,  ETT 08-13-2017 no ischemia   Pre-diabetes    Psoriasis     Past Surgical History:  Procedure Laterality Date   APPENDECTOMY     age 77   COLONOSCOPY WITH PROPOFOL  09/30/2022   dr Marca Ancona   JOINT REPLACEMENT  01/07/2023   KNEE CLOSED REDUCTION Left 03/02/2023   Procedure: CLOSED MANIPULATION KNEE;  Surgeon: Joen Laura, MD;  Location: [facility] ;  Service: Orthopedics;  Laterality: Left;   RADIOACTIVE SEED IMPLANT N/A 01/21/2022   Procedure: RADIOACTIVE SEED IMPLANT/BRACHYTHERAPY IMPLANT;  Surgeon: Belva Agee, MD;  Location: [facility];  Service: Urology;  Laterality: N/A;  90 MINS   SPACE OAR INSTILLATION N/A 01/21/2022   Procedure: SPACE OAR INSTILLATION;  Surgeon: Belva Agee, MD;  Location: [facility];  Service: Urology;  Laterality: N/A;   TOTAL KNEE ARTHROPLASTY Left 01/07/2023   @ [facility] by dr Blanchie Dessert    Social History   Tobacco Use   Smoking status: Former   Smokeless tobacco: Never   Tobacco comments:    yuck!  Substance Use Topics   Alcohol use: Yes    Alcohol/week: 5.0 standard drinks of alcohol    Types: 3 Glasses of wine, 2 Standard drinks or equivalent per week   Drug use: No    Family History  Problem Relation Age of Onset  Cancer Father        prostate cancer   Hypertension Mother    Heart disease Mother    Alcohol abuse Mother     Allergies  Allergen Reactions   Buspar [Buspirone]     Pt stated does remember reaction but does remember it was allergic reaction   Mobic [Meloxicam] Nausea And Vomiting   Statins     myalgias    Medication list has been reviewed and updated.  Current Outpatient Medications on File Prior to Visit  Medication Sig Dispense Refill   acyclovir (ZOVIRAX) 400 MG tablet TAKE ONE TABLET BY MOUTH THREE TIMES A DAY AS NEEDED FOR OUTBREAKS 45 tablet 3   ALPRAZolam (XANAX) 1 MG tablet TAKE 1/2 TABLET BY MOUTH AT NOON AND 1/2  TABLET EVERY NIGHT AT BEDTIME. MAY TAKE A WHOLE TABLET IF NEEDED ON OCCASION 60 tablet 2   betamethasone valerate lotion (VALISONE) 0.1 % Apply 1 application  topically 2 (two) times daily as needed.     hydrOXYzine (ATARAX) 25 MG tablet Take 1 tablet (25 mg total) by mouth every 8 (eight) hours as needed for itching. 45 tablet 0   lisinopril (ZESTRIL) 5 MG tablet Take 1 tablet (5 mg total) by mouth daily. 90 tablet 1   propranolol (INDERAL) 10 MG tablet TAKE 1 TABLET BY MOUTH TWO TIMES A DAY AS NEEDED FOR PERFORMANCE ANXIETY 30 tablet 3   REPATHA SURECLICK 140 MG/ML SOAJ INJECT 140 MGS UNDER THE SKIN EVERY 14 DAYS 2 mL 11   tadalafil (CIALIS) 20 MG tablet Take 1 tablet (20 mg total) by mouth daily as needed for erectile dysfunction. 30 tablet 2   tamsulosin (FLOMAX) 0.4 MG CAPS capsule Take 1 capsule (0.4 mg total) by mouth daily after supper. (Patient taking differently: Take 0.4 mg by mouth daily after supper.) 30 capsule 2   traMADol (ULTRAM) 50 MG tablet TAKE 1 TABLET BY MOUTH EVERY 8 HOURS AS NEEDED 30 tablet 2   Trolamine Salicylate (ASPERCREME EX) Apply topically as needed.     No current facility-administered medications on file prior to visit.    Review of Systems:  As per HPI- otherwise negative.   Physical Examination: There were no vitals filed for this visit. There were no vitals filed for this visit. There is no height or weight on file to calculate BMI. Ideal Body Weight:    GEN: no acute distress. HEENT: Atraumatic, Normocephalic.  Ears and Nose: No external deformity. CV: RRR, No M/G/R. No JVD. No thrill. No extra heart sounds. PULM: CTA B, no wheezes, crackles, rhonchi. No retractions. No resp. distress. No accessory muscle use. ABD: S, NT, ND, +BS. No rebound. No HSM. EXTR: No c/c/e PSYCH: Normally interactive. Conversant.    Assessment and Plan: ***  Signed Abbe Amsterdam, MD

## 2024-01-13 ENCOUNTER — Encounter: Payer: Self-pay | Admitting: Family Medicine

## 2024-01-13 ENCOUNTER — Ambulatory Visit (INDEPENDENT_AMBULATORY_CARE_PROVIDER_SITE_OTHER): Payer: PPO | Admitting: Family Medicine

## 2024-01-13 VITALS — BP 145/85 | HR 80 | Temp 97.9°F | Resp 18 | Ht 66.5 in | Wt 185.0 lb

## 2024-01-13 DIAGNOSIS — I10 Essential (primary) hypertension: Secondary | ICD-10-CM

## 2024-01-13 DIAGNOSIS — Z23 Encounter for immunization: Secondary | ICD-10-CM | POA: Diagnosis not present

## 2024-01-13 DIAGNOSIS — E785 Hyperlipidemia, unspecified: Secondary | ICD-10-CM | POA: Diagnosis not present

## 2024-01-13 DIAGNOSIS — R7303 Prediabetes: Secondary | ICD-10-CM

## 2024-01-13 LAB — COMPREHENSIVE METABOLIC PANEL
ALT: 20 U/L (ref 0–53)
AST: 17 U/L (ref 0–37)
Albumin: 4.5 g/dL (ref 3.5–5.2)
Alkaline Phosphatase: 47 U/L (ref 39–117)
BUN: 15 mg/dL (ref 6–23)
CO2: 28 meq/L (ref 19–32)
Calcium: 9.3 mg/dL (ref 8.4–10.5)
Chloride: 105 meq/L (ref 96–112)
Creatinine, Ser: 0.95 mg/dL (ref 0.40–1.50)
GFR: 80.88 mL/min (ref >60.00)
Glucose, Bld: 106 mg/dL — ABNORMAL HIGH (ref 70–99)
Potassium: 3.8 meq/L (ref 3.5–5.1)
Sodium: 140 meq/L (ref 135–145)
Total Bilirubin: 0.7 mg/dL (ref 0.2–1.2)
Total Protein: 6.7 g/dL (ref 6.0–8.3)

## 2024-01-13 LAB — CBC
HCT: 45.8 % (ref 39.0–52.0)
Hemoglobin: 15.2 g/dL (ref 13.0–17.0)
MCHC: 33.2 g/dL (ref 30.0–36.0)
MCV: 86.5 fL (ref 78.0–100.0)
Platelets: 387 10*3/uL (ref 150.0–400.0)
RBC: 5.29 Mil/uL (ref 4.22–5.81)
RDW: 14.2 % (ref 11.5–15.5)
WBC: 5.4 10*3/uL (ref 4.0–10.5)

## 2024-01-13 LAB — LIPID PANEL
Cholesterol: 122 mg/dL (ref 0–200)
HDL: 49.4 mg/dL (ref >39.00)
LDL Cholesterol: 48 mg/dL (ref 0–99)
NonHDL: 72.89
Total CHOL/HDL Ratio: 2
Triglycerides: 123 mg/dL (ref 0.0–149.0)
VLDL: 24.6 mg/dL (ref 0.0–40.0)

## 2024-01-13 LAB — HEMOGLOBIN A1C: Hgb A1c MFr Bld: 6.2 % (ref 4.6–6.5)

## 2024-01-13 NOTE — Addendum Note (Signed)
Addended by: CREFT, Feliberto Harts on: 01/13/2024 01:11 PM   Modules accepted: Orders

## 2024-01-13 NOTE — Patient Instructions (Addendum)
It was good to see you today- I will be in touch with your labs asap  Flu shot today Recommend covid booster and shingles vaccine series after your upcoming trip  Assuming all is well please see me in about 6 months

## 2024-02-05 ENCOUNTER — Encounter: Payer: Self-pay | Admitting: Family Medicine

## 2024-02-11 ENCOUNTER — Other Ambulatory Visit: Payer: Self-pay | Admitting: Family Medicine

## 2024-02-11 DIAGNOSIS — L299 Pruritus, unspecified: Secondary | ICD-10-CM

## 2024-02-11 DIAGNOSIS — I10 Essential (primary) hypertension: Secondary | ICD-10-CM

## 2024-02-11 DIAGNOSIS — F411 Generalized anxiety disorder: Secondary | ICD-10-CM

## 2024-02-16 NOTE — Progress Notes (Unsigned)
 [facility] [address], [address] [place], [place] [address] [phone]  Date:  02/17/2024   Name:  Dhaval Woo.   DOB:  October 05, 1951   MRN:  [id number]  PCP:  Pearline Cables, MD    Chief Complaint: URI (Sxs started 3 weeks ago. He has a productive cough, and runny nose. )   History of Present Illness:  Ida Uppal. is a 73 y.o. very pleasant male patient who presents with the following:  Patient seen today with concern of illness Most recent visit with myself was in January-about 1 month ago for routine recheck  History of prediabetes, BPH, hypertension, hyperlipidemia, anxiety treated with xanax, joint pain, prostate cancer diagnosed 2022 and treated with radioactive seed implantation    Prostate cancer is considered to be in remission, monitored by urology-most recent [facility] October 2024- they will recheck in April  He reached out to me via MyChart on February 14 with concern of flulike symptoms. Encouraged him to take an over-the-counter flu test and let me know the results, we did not hear from him again until yesterday when he let me know he was still feeling poorly.  We made this appointment for today  At this time he notes a runny nose, congestion and productive cough Last week he heard wheezing but this seems to have resolved He is not aware of a fever recently- he did have towards the start of illness No abx as of yet- just OTC meds No GI symptoms  He has not tried to play his pipes since he got sick  Lab Results  Component Value Date   HGBA1C 6.2 01/13/2024     Patient Active Problem List   Diagnosis Date Noted   Prediabetes 02/11/2022   Overweight (BMI 25.0-29.9) 02/11/2022   Malignant neoplasm of prostate (HCC) 10/25/2021   Elevated PSA    BPH (benign prostatic hyperplasia) 12/25/2014   Erectile dysfunction 03/10/2014   Anxiety 12/06/2012   Psoriasis 12/06/2012    Hypertension    Hyperlipidemia     Past Medical History:  Diagnosis Date   Allergy    Ankylosis, left knee    post TKA   Arthritis    BPH (benign prostatic hyperplasia)    Complication of anesthesia    per pt causes memory issues post op   ED (erectile dysfunction)    GAD (generalized anxiety disorder)    History of adenomatous polyp of colon    Hyperlipidemia    Hypertension    Malignant neoplasm prostate (HCC) 09/2021   urologist--- dr newsome/  radiation oncologist--- dr Kathrynn Running;  dx 10/ 2022, Gleason, 3+4;   01-21-2022  radioactive prostate seed implants   Postural dizziness with presyncope 07/2021   cardiologist evalution by dr Servando Salina;  tested results in epic ---- event monitor 07-24-2021  shows SR w/ rare PAC/ PVC;  echo 08-13-2021  ef 55-60%, GIDD, mild MR/ AR,, AA 3.9cm,  ETT 08-13-2017 no ischemia   Pre-diabetes    Psoriasis     Past Surgical History:  Procedure Laterality Date   APPENDECTOMY     age 74   COLONOSCOPY WITH PROPOFOL  09/30/2022   dr Marca Ancona   JOINT REPLACEMENT  01/07/2023   KNEE CLOSED REDUCTION Left 03/02/2023   Procedure: CLOSED MANIPULATION KNEE;  Surgeon: Joen Laura, MD;  Location: [facility];  Service: Orthopedics;  Laterality: Left;   RADIOACTIVE SEED IMPLANT N/A  01/21/2022   Procedure: RADIOACTIVE SEED IMPLANT/BRACHYTHERAPY IMPLANT;  Surgeon: Belva Agee, MD;  Location: [facility];  Service: Urology;  Laterality: N/A;  90 MINS   SPACE OAR INSTILLATION N/A 01/21/2022   Procedure: SPACE OAR INSTILLATION;  Surgeon: Belva Agee, MD;  Location: [facility];  Service: Urology;  Laterality: N/A;   TOTAL KNEE ARTHROPLASTY Left 01/07/2023   @ [facility] by dr Blanchie Dessert    Social History   Tobacco Use   Smoking status: Former   Smokeless tobacco: Never   Tobacco comments:    yuck!  Substance Use Topics   Alcohol use: Yes    Alcohol/week: 5.0 standard drinks of alcohol    Types: 3  Glasses of wine, 2 Standard drinks or equivalent per week   Drug use: No    Family History  Problem Relation Age of Onset   Cancer Father        prostate cancer   Hypertension Mother    Heart disease Mother    Alcohol abuse Mother     Allergies  Allergen Reactions   Buspar [Buspirone]     Pt stated does remember reaction but does remember it was allergic reaction   Mobic [Meloxicam] Nausea And Vomiting   Statins     myalgias    Medication list has been reviewed and updated.  Current Outpatient Medications on File Prior to Visit  Medication Sig Dispense Refill   acyclovir (ZOVIRAX) 400 MG tablet TAKE ONE TABLET BY MOUTH THREE TIMES A DAY AS NEEDED FOR OUTBREAKS 45 tablet 3   ALPRAZolam (XANAX) 1 MG tablet TAKE 1/2 TABLET BY MOUTH AT NOON AND 1/2 TABLET BY MOUTH EVERY NIGHT AT BEDTIME MAY TAKE A WHOLE TABLET IF NEEDED ON OCCASION 60 tablet 1   betamethasone valerate lotion (VALISONE) 0.1 % Apply 1 application  topically 2 (two) times daily as needed.     hydrOXYzine (ATARAX) 25 MG tablet TAKE ONE TABLET BY MOUTH EVERY 8 HOURS AS NEEDED FOR ITCHING 45 tablet 0   lisinopril (ZESTRIL) 5 MG tablet Take 1 tablet (5 mg total) by mouth daily. 90 tablet 1   propranolol (INDERAL) 10 MG tablet TAKE 1 TABLET BY MOUTH TWO TIMES A DAY AS NEEDED FOR PERFORMANCE ANXIETY 30 tablet 3   REPATHA SURECLICK 140 MG/ML SOAJ INJECT 140 MGS UNDER THE SKIN EVERY 14 DAYS 2 mL 11   tadalafil (CIALIS) 20 MG tablet Take 1 tablet (20 mg total) by mouth daily as needed for erectile dysfunction. 30 tablet 2   tamsulosin (FLOMAX) 0.4 MG CAPS capsule Take 1 capsule (0.4 mg total) by mouth daily after supper. (Patient taking differently: Take 0.4 mg by mouth daily after supper.) 30 capsule 2   traMADol (ULTRAM) 50 MG tablet TAKE 1 TABLET BY MOUTH EVERY 8 HOURS AS NEEDED 30 tablet 2   Trolamine Salicylate (ASPERCREME EX) Apply topically as needed.     No current facility-administered medications on file prior to  visit.    Review of Systems:  As per HPI- otherwise negative.   Physical Examination: Vitals:   02/17/24 0947  BP: (!) 142/80  Pulse: 67  Resp: 18  Temp: 97.6 F (36.4 C)  SpO2: 98%   Vitals:   02/17/24 0947  Weight: 184 lb 6.4 oz (83.6 kg)  Height: 5' 6.5" (1.689 m)   Body mass index is 29.32 kg/m. Ideal Body Weight: Weight in (lb) to have BMI = 25: 156.9  GEN: no acute distress. Mild overweight, looks  well  HEENT: Atraumatic, Normocephalic.  Bilateral TM wnl, oropharynx normal.  PEERL,EOMI.   Ears and Nose: No external deformity. CV: RRR, No M/G/R. No JVD. No thrill. No extra heart sounds. PULM: mild expiratory wheezes, no crackles, rhonchi. No retractions. No resp. distress. No accessory muscle use. ABD: S, NT, ND, No rebound. No HSM. EXTR: No c/c/e PSYCH: Normally interactive. Conversant.    Assessment and Plan: Acute bronchitis, unspecified organism - Plan: DG Chest 2 View, cefdinir (OMNICEF) 300 MG capsule, predniSONE (DELTASONE) 20 MG tablet  Pt seen today with bronchitis Prednisone, omnicef Chest film pending OTC meds for cough and other sx    Signed Abbe Amsterdam, MD

## 2024-02-17 ENCOUNTER — Ambulatory Visit (INDEPENDENT_AMBULATORY_CARE_PROVIDER_SITE_OTHER): Payer: PPO | Admitting: Family Medicine

## 2024-02-17 ENCOUNTER — Encounter: Payer: Self-pay | Admitting: Family Medicine

## 2024-02-17 ENCOUNTER — Ambulatory Visit (HOSPITAL_BASED_OUTPATIENT_CLINIC_OR_DEPARTMENT_OTHER)
Admission: RE | Admit: 2024-02-17 | Discharge: 2024-02-17 | Disposition: A | Discharge status: Home / Self Care | Payer: PPO | Source: Ambulatory Visit | Attending: Family Medicine | Admitting: Family Medicine

## 2024-02-17 VITALS — BP 142/80 | HR 67 | Temp 97.6°F | Resp 18 | Ht 66.5 in | Wt 184.4 lb

## 2024-02-17 DIAGNOSIS — J209 Acute bronchitis, unspecified: Secondary | ICD-10-CM | POA: Insufficient documentation

## 2024-02-17 DIAGNOSIS — R059 Cough, unspecified: Secondary | ICD-10-CM | POA: Diagnosis not present

## 2024-02-17 DIAGNOSIS — R0602 Shortness of breath: Secondary | ICD-10-CM | POA: Diagnosis not present

## 2024-02-17 MED ORDER — CEFDINIR 300 MG PO CAPS: 300.0000 mg | ORAL_CAPSULE | Freq: Two times a day (BID) | ORAL | 0 refills | Status: DC

## 2024-02-17 MED ORDER — PREDNISONE 20 MG PO TABS
ORAL_TABLET | ORAL | 0 refills | Status: DC
Start: 2024-02-17 — End: 2024-07-09

## 2024-02-17 NOTE — Patient Instructions (Signed)
 It was good to see you today- I will be in touch with your chest x-ray asap In the meantime let's treat you with omnicef antibiotic and steroids for 3 days Please let me know if you are not improving soon

## 2024-02-18 ENCOUNTER — Other Ambulatory Visit: Payer: Self-pay

## 2024-02-18 ENCOUNTER — Ambulatory Visit: Payer: PPO | Attending: Family Medicine

## 2024-02-18 DIAGNOSIS — M6281 Muscle weakness (generalized): Secondary | ICD-10-CM | POA: Insufficient documentation

## 2024-02-18 DIAGNOSIS — C61 Malignant neoplasm of prostate: Secondary | ICD-10-CM | POA: Diagnosis not present

## 2024-02-18 DIAGNOSIS — R293 Abnormal posture: Secondary | ICD-10-CM | POA: Insufficient documentation

## 2024-02-18 DIAGNOSIS — R279 Unspecified lack of coordination: Secondary | ICD-10-CM | POA: Diagnosis not present

## 2024-02-18 NOTE — Therapy (Signed)
 OUTPATIENT PHYSICAL THERAPY MALE PELVIC EVALUATION   Patient Name: John Wagner. MRN: [id number] DOB:Apr 09, 1951, 73 y.o., male Today's Date: 02/18/2024  END OF SESSION:  PT End of Session - 02/18/24 0842     Visit Number 1    Date for PT Re-Evaluation 05/12/24    Authorization Type HTA    PT Start Time 0845    PT Stop Time 0925    PT Time Calculation (min) 40 min    Activity Tolerance Patient tolerated treatment well    Behavior During Therapy WFL for tasks assessed/performed             Past Medical History:  Diagnosis Date   Allergy    Ankylosis, left knee    post TKA   Arthritis    BPH (benign prostatic hyperplasia)    Complication of anesthesia    per pt causes memory issues post op   ED (erectile dysfunction)    GAD (generalized anxiety disorder)    History of adenomatous polyp of colon    Hyperlipidemia    Hypertension    Malignant neoplasm prostate (HCC) 09/2021   urologist--- dr newsome/  radiation oncologist--- dr Kathrynn Running;  dx 10/ 2022, Gleason, 3+4;   01-21-2022  radioactive prostate seed implants   Postural dizziness with presyncope 07/2021   cardiologist evalution by dr Servando Salina;  tested results in epic ---- event monitor 07-24-2021  shows SR w/ rare PAC/ PVC;  echo 08-13-2021  ef 55-60%, GIDD, mild MR/ AR,, AA 3.9cm,  ETT 08-13-2017 no ischemia   Pre-diabetes    Psoriasis    Past Surgical History:  Procedure Laterality Date   APPENDECTOMY     age 34   COLONOSCOPY WITH PROPOFOL  09/30/2022   dr Marca Ancona   JOINT REPLACEMENT  01/07/2023   KNEE CLOSED REDUCTION Left 03/02/2023   Procedure: CLOSED MANIPULATION KNEE;  Surgeon: Joen Laura, MD;  Location: [facility];  Service: Orthopedics;  Laterality: Left;   RADIOACTIVE SEED IMPLANT N/A 01/21/2022   Procedure: RADIOACTIVE SEED IMPLANT/BRACHYTHERAPY IMPLANT;  Surgeon: Belva Agee, MD;  Location: [facility];  Service: Urology;  Laterality: N/A;  90 MINS   SPACE  OAR INSTILLATION N/A 01/21/2022   Procedure: SPACE OAR INSTILLATION;  Surgeon: Belva Agee, MD;  Location: [facility];  Service: Urology;  Laterality: N/A;   TOTAL KNEE ARTHROPLASTY Left 01/07/2023   @ [facility] by dr Blanchie Dessert   Patient Active Problem List   Diagnosis Date Noted   Prediabetes 02/11/2022   Overweight (BMI 25.0-29.9) 02/11/2022   Malignant neoplasm of prostate (HCC) 10/25/2021   Elevated PSA    BPH (benign prostatic hyperplasia) 12/25/2014   Erectile dysfunction 03/10/2014   Anxiety 12/06/2012   Psoriasis 12/06/2012   Hypertension    Hyperlipidemia     PCP: Copland, Gwenlyn Found, MD   REFERRING PROVIDER: Pearline Cables, MD   REFERRING DIAG: C61 (ICD-10-CM) - Prostate cancer (HCC)  THERAPY DIAG:  Muscle weakness (generalized)  Unspecified lack of coordination  Abnormal posture  Rationale for Evaluation and Treatment: Rehabilitation  ONSET DATE: 2022  SUBJECTIVE:  SUBJECTIVE STATEMENT: Pt states that he had prostate cancer and diagnosed in 2022. They did not remove prostate, but they had radioactive seed implant. He was not able to urinate for months. He has to take flomax regularly. He feels like a lot of his situation is due to age and then surgery. He states that he is having difficulty holding urine. In order not to leak, he has to unzip pants and manually hold himself. He does try to tighten the muscles in penis which works to a certain point. He also leaks after he urinates.  Fluid intake: coffee, cocktail in the afternoon, drinks a bottle of water before bed and throughout the night, a lot of juice throughout the day  PAIN:  Are you having pain? No   PRECAUTIONS: None  RED FLAGS: None   WEIGHT BEARING RESTRICTIONS: No  FALLS:  Has patient  fallen in last 6 months? 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  ACTIVITY LEVEL : plays the bag pipe, but no other exercise   PLOF: Independent  PATIENT GOALS: better bladder control   PERTINENT HISTORY:  Lt TKA, Lt closed manipulation of Lt knee, appendectomy, benign prostatic hyperplasia, erectile dysfunction, anxiety, malignant neoplasm of prostate treated with radioactive seed implantation 2022 (remission), psoriasis  BOWEL MOVEMENT: Pain with bowel movement: No Type of bowel movement:Frequency 1x/day and Strain no Fully empty rectum: Yes:   Leakage: No unless he is sick Pads: No Fiber supplement/laxative No  URINATION: Pain with urination: No Fully empty bladder: No Stream: Weak Urgency: Yes  Frequency: couple times a day;  Leakage: Urge to void and Walking to the bathroom, walking down stairs Pads: No, keeps tissue in his pocket  INTERCOURSE: Pain with intercourse: none Erection: he will initially get erection with medication, but then cannot maintain  Ejaculation: difficult to find the right stimulation for orgasm    OBJECTIVE:  Note: Objective measures were completed at Evaluation unless otherwise noted. 02/18/24: PATIENT SURVEYS:   PFIQ-7: 81  COGNITION: Overall cognitive status: Within functional limits for tasks assessed     SENSATION: Light touch: Appears intact  FUNCTIONAL TESTS:  Squat: Rt weight shift, large Rt pelvic drop in squat, bil LE ER Single leg stance:  Rt: Lt pelvic drop  Lt: Compensated trendelenburg Curl-up test: 3 finger width distortion    GAIT: Assistive device utilized: None Comments: slow gait, bil LE ER  POSTURE: rounded shoulders, forward head, decreased lumbar lordosis, increased thoracic kyphosis, and posterior pelvic tilt   LUMBARAROM/PROM:  A/PROM A/PROM  Eval (% available)  Flexion 100  Extension 50  Right lateral flexion 100  Left lateral flexion 100  Right rotation 100  Left  rotation 100   (Blank rows = not tested)   PALPATION:  Abdominal: apical breathing pattern, decreased rib cage mobility on Lt                External Perineal Exam: redness and irritation - tissue looks thin                              Internal Pelvic Floor: some tenderness   Patient confirms identification and approves PT to assess internal pelvic floor and treatment Yes  PELVIC MMT:   MMT eval  Internal Anal Sphincter 2/5  External Anal Sphincter 2/5  Puborectalis 2/5  Diastasis Recti 2 finger width separation above umbilicus  (Blank rows = not tested)  TONE: Low   TODAY'S TREATMENT:                                                                                                                              DATE:  02/18/24  EVAL  Neuromuscular re-education: Pt provides verbal consent for internal rectal pelvic floor exam. Internal rectal pelvic floor muscle contraction training  Quick flicks Long holds Urge drill  PATIENT EDUCATION:  Education details: See above Person educated: Patient Education method: Explanation, Demonstration, Tactile cues, Verbal cues, and Handouts Education comprehension: verbalized understanding  HOME EXERCISE PROGRAM: [id number]  ASSESSMENT:  CLINICAL IMPRESSION: Patient is a 73 y.o. male who was seen today for physical therapy evaluation and treatment for urinary incontinence. Exam findings notable for decreased lumbar extension, abnormal posture, decreased pelvic stability in single leg stance, abnormal squat form with large weight shift, upper abdominal diastasis recti abdominus with distortion, perineal skin irritation and redness, low tone in external anal sphincter, pelvic floor muscle weakness, decreased pelvic floor muscle coordination, poor pelvic floor muscle endurance. Signs and symptoms are most consistent with poor pelvic floor muscle coordination and weakness. Initial treatment consisted of pelvic floor muscle contraction  training and the urge drill. He will continue to benefit from skilled PT intervention in order to increase bladder control, improve core strength, and begin/progress functional strengthening program.   OBJECTIVE IMPAIRMENTS: decreased activity tolerance, decreased coordination, decreased endurance, decreased mobility, decreased strength, increased fascial restrictions, increased muscle spasms, impaired tone, postural dysfunction, and pain.   ACTIVITY LIMITATIONS: continence  PARTICIPATION LIMITATIONS: cleaning, interpersonal relationship, community activity, and occupation, and exercise  PERSONAL FACTORS: 3+ comorbidities: medical history  are also affecting patient's functional outcome.   REHAB POTENTIAL: Good  CLINICAL DECISION MAKING: Evolving/moderate complexity  EVALUATION COMPLEXITY: Moderate   GOALS: Goals reviewed with patient? Yes  SHORT TERM GOALS: Target date: 03/17/2024   Pt will be independent with HEP.   Baseline: Goal status: INITIAL  2.  Pt will be able to teach back and utilize urge suppression technique in order to help reduce number of trips to the bathroom.    Baseline:  Goal status: INITIAL  3.  Pt will be independent with use of double voiding in order to help complete bladder emptying and avoid post-void dribbling.  Baseline:  Goal status: INITIAL  4.  Pt will report 25% improvement in urinary incontinence.  Baseline:  Goal status: INITIAL   LONG TERM GOALS: Target date: 05/12/24  Pt will be independent with advanced HEP.   Baseline:  Goal status: INITIAL  2.  Pt will report 75% improvement in urinary incontinence.  Baseline:  Goal status: INITIAL  3.  Pt will increase pelvic floor muscle strength to at least 3/5.  Baseline:  Goal status: INITIAL  4.  Pt will decrease PFIQ-7 score to <30 in order to demonstrate functional improvement.  Baseline:  Goal status: INITIAL  5.  Pt will be able to teach back and utilize urge  suppression  technique in order to help reduce number of trips to the bathroom.    Baseline:  Goal status: INITIAL    PLAN:  PT FREQUENCY: 1-2x/week  PT DURATION: 12 weeks  PLANNED INTERVENTIONS: 97110-Therapeutic exercises, 97530- Therapeutic activity, 97112- Neuromuscular re-education, 97535- Self Care, 16109- Manual therapy, Dry Needling, and Biofeedback  PLAN FOR NEXT SESSION: Begin core strengthening.    Julio Alm, PT, DPT02/27/2510:02 AM

## 2024-02-18 NOTE — Patient Instructions (Signed)
 Double-voiding:  This technique is to help with post-void dribbling, or leaking a little bit when you stand up right after urinating. Use relaxed toileting mechanics to urinate as much as you feel like you have to without straining. Sit back upright from leaning forward and relax this way for 10-20 seconds. Lean forward again to finish voiding any amount more.   Urge Incontinence  Ideal urination frequency is every 2-4 wakeful hours, which equates to 5-8 times within a 24-hour period.   Urge incontinence is leakage that occurs when the bladder muscle contracts, creating a sudden need to go before getting to the bathroom.   Going too often when your bladder isn't actually full can disrupt the body's automatic signals to store and hold urine longer, which will increase urgency/frequency.  In this case, the bladder "is running the show" and strategies can be learned to retrain this pattern.   One should be able to control the first urge to urinate, at around .  The bladder can hold up to a "grande latte," or . To help you gain control, practice the Urge Drill below when urgency strikes.  This drill will help retrain your bladder signals and allow you to store and hold urine longer.  The overall goal is to stretch out your time between voids to reach a more manageable voiding schedule.    Practice your "quick flicks" often throughout the day (each waking hour) even when you don't need feel the urge to go.  This will help strengthen your pelvic floor muscles, making them more effective in controlling leakage.  Urge Drill  When you feel an urge to go, follow these steps to regain control: Stop what you are doing and be still Take one deep breath, directing your air into your abdomen Think an affirming thought, such as "I've got this." Do 5 quick flicks of your pelvic floor Walk with control to the bathroom to void, or delay voiding  [facility] [address] [place], [place] [address] Phone # [phone] Fax [phone]

## 2024-02-25 ENCOUNTER — Ambulatory Visit: Payer: PPO | Attending: Family Medicine

## 2024-02-25 DIAGNOSIS — M6281 Muscle weakness (generalized): Secondary | ICD-10-CM | POA: Insufficient documentation

## 2024-02-25 DIAGNOSIS — R293 Abnormal posture: Secondary | ICD-10-CM | POA: Insufficient documentation

## 2024-02-25 DIAGNOSIS — R279 Unspecified lack of coordination: Secondary | ICD-10-CM | POA: Diagnosis not present

## 2024-02-25 NOTE — Therapy (Signed)
 OUTPATIENT PHYSICAL THERAPY MALE PELVIC TREATMENT   Patient Name: John Wagner. MRN: [id number] DOB:01/17/1951, 73 y.o., male Today's Date: 02/25/2024  END OF SESSION:  PT End of Session - 02/25/24 0844     Visit Number 2    Date for PT Re-Evaluation 05/12/24    Authorization Type HTA    PT Start Time 0845    PT Stop Time 0925    PT Time Calculation (min) 40 min    Activity Tolerance Patient tolerated treatment well    Behavior During Therapy WFL for tasks assessed/performed              Past Medical History:  Diagnosis Date   Allergy    Ankylosis, left knee    post TKA   Arthritis    BPH (benign prostatic hyperplasia)    Complication of anesthesia    per pt causes memory issues post op   ED (erectile dysfunction)    GAD (generalized anxiety disorder)    History of adenomatous polyp of colon    Hyperlipidemia    Hypertension    Malignant neoplasm prostate (HCC) 09/2021   urologist--- dr newsome/  radiation oncologist--- dr Kathrynn Running;  dx 10/ 2022, Gleason, 3+4;   01-21-2022  radioactive prostate seed implants   Postural dizziness with presyncope 07/2021   cardiologist evalution by dr Servando Salina;  tested results in epic ---- event monitor 07-24-2021  shows SR w/ rare PAC/ PVC;  echo 08-13-2021  ef 55-60%, GIDD, mild MR/ AR,, AA 3.9cm,  ETT 08-13-2017 no ischemia   Pre-diabetes    Psoriasis    Past Surgical History:  Procedure Laterality Date   APPENDECTOMY     age 52   COLONOSCOPY WITH PROPOFOL  09/30/2022   dr Marca Ancona   JOINT REPLACEMENT  01/07/2023   KNEE CLOSED REDUCTION Left 03/02/2023   Procedure: CLOSED MANIPULATION KNEE;  Surgeon: Joen Laura, MD;  Location: [facility];  Service: Orthopedics;  Laterality: Left;   RADIOACTIVE SEED IMPLANT N/A 01/21/2022   Procedure: RADIOACTIVE SEED IMPLANT/BRACHYTHERAPY IMPLANT;  Surgeon: Belva Agee, MD;  Location: [facility];  Service: Urology;  Laterality: N/A;  90 MINS   SPACE  OAR INSTILLATION N/A 01/21/2022   Procedure: SPACE OAR INSTILLATION;  Surgeon: Belva Agee, MD;  Location: [facility];  Service: Urology;  Laterality: N/A;   TOTAL KNEE ARTHROPLASTY Left 01/07/2023   @ [facility] by dr Blanchie Dessert   Patient Active Problem List   Diagnosis Date Noted   Prediabetes 02/11/2022   Overweight (BMI 25.0-29.9) 02/11/2022   Malignant neoplasm of prostate (HCC) 10/25/2021   Elevated PSA    BPH (benign prostatic hyperplasia) 12/25/2014   Erectile dysfunction 03/10/2014   Anxiety 12/06/2012   Psoriasis 12/06/2012   Hypertension    Hyperlipidemia     PCP: Copland, Gwenlyn Found, MD   REFERRING PROVIDER: Pearline Cables, MD   REFERRING DIAG: C61 (ICD-10-CM) - Prostate cancer (HCC)  THERAPY DIAG:  Muscle weakness (generalized)  Unspecified lack of coordination  Abnormal posture  Rationale for Evaluation and Treatment: Rehabilitation  ONSET DATE: 2022  SUBJECTIVE:  SUBJECTIVE STATEMENT: Pt states that he started doing exercises, but got confused about his muscle groups. He cannot tell a difference between anus and penis contractions. He has been placing hand on wall when urinating and feels like that helps him relax. He has been sitting some and does understand that it can be helpful.   PAIN:  Are you having pain? No   PRECAUTIONS: None  RED FLAGS: None   WEIGHT BEARING RESTRICTIONS: No  FALLS:  Has patient fallen in last 6 months? 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  ACTIVITY LEVEL : plays the bag pipe, but no other exercise   PLOF: Independent  PATIENT GOALS: better bladder control   PERTINENT HISTORY:  Lt TKA, Lt closed manipulation of Lt knee, appendectomy, benign prostatic hyperplasia, erectile dysfunction,  anxiety, malignant neoplasm of prostate treated with radioactive seed implantation 2022 (remission), psoriasis  BOWEL MOVEMENT: Pain with bowel movement: No Type of bowel movement:Frequency 1x/day and Strain no Fully empty rectum: Yes:   Leakage: No unless he is sick Pads: No Fiber supplement/laxative No  URINATION: Pain with urination: No Fully empty bladder: No Stream: Weak Urgency: Yes  Frequency: couple times a day;  Leakage: Urge to void and Walking to the bathroom, walking down stairs Pads: No, keeps tissue in his pocket  INTERCOURSE: Pain with intercourse: none Erection: he will initially get erection with medication, but then cannot maintain  Ejaculation: difficult to find the right stimulation for orgasm    OBJECTIVE:  Note: Objective measures were completed at Evaluation unless otherwise noted. 02/18/24: PATIENT SURVEYS:   PFIQ-7: 47  COGNITION: Overall cognitive status: Within functional limits for tasks assessed     SENSATION: Light touch: Appears intact  FUNCTIONAL TESTS:  Squat: Rt weight shift, large Rt pelvic drop in squat, bil LE ER Single leg stance:  Rt: Lt pelvic drop  Lt: Compensated trendelenburg Curl-up test: 3 finger width distortion    GAIT: Assistive device utilized: None Comments: slow gait, bil LE ER  POSTURE: rounded shoulders, forward head, decreased lumbar lordosis, increased thoracic kyphosis, and posterior pelvic tilt   LUMBARAROM/PROM:  A/PROM A/PROM  Eval (% available)  Flexion 100  Extension 50  Right lateral flexion 100  Left lateral flexion 100  Right rotation 100  Left rotation 100   (Blank rows = not tested)   PALPATION:  Abdominal: apical breathing pattern, decreased rib cage mobility on Lt                External Perineal Exam: redness and irritation - tissue looks thin                              Internal Pelvic Floor: some tenderness   Patient confirms identification and approves PT to assess  internal pelvic floor and treatment Yes  PELVIC MMT:   MMT eval  Internal Anal Sphincter 2/5  External Anal Sphincter 2/5  Puborectalis 2/5  Diastasis Recti 2 finger width separation above umbilicus  (Blank rows = not tested)        TONE: Low   TODAY'S TREATMENT:  DATE:  02/25/24 Neuromuscular re-education: Pelvic floor muscle contraction training in supine with external perineal tactile cues Transversus abdominus training with multimodal cues for improved motor control and breath coordination  Transversus abdominus isometrics 10x Bil supine UE ball press with transversus abdominus and pelvic floor muscle contractions and breath coordination 10x Supine hip adduction ball press with transversus abdominus and pelvic floor muscle contractions and breath coordination 10x Bridge with hip adduction, transversus abdominus, and pelvic floor muscle 2 x 10 Seated hip adduction ball press with transversus abdominus and pelvic floor muscle 2 x 10 Seated hip abduction red band with transversus abdominus and pelvic floor muscle 2 x 10 Seated resisted march red band with transversus abdominus and pelvic floor muscle 2 x 10   02/18/24  EVAL  Neuromuscular re-education: Pt provides verbal consent for internal rectal pelvic floor exam. Internal rectal pelvic floor muscle contraction training  Quick flicks Long holds Urge drill  PATIENT EDUCATION:  Education details: See above Person educated: Patient Education method: Programmer, multimedia, Demonstration, Tactile cues, Verbal cues, and Handouts Education comprehension: verbalized understanding  HOME EXERCISE PROGRAM: [id number]  ASSESSMENT:  CLINICAL IMPRESSION: Pt reports some confusion over pelvic floor muscle contraction. We reviewed how to perform correctly in supine with external tactile cues. He was performing correctly.  We also looked at model to show how the superficial sphincter muscles are separate, but the deep pelvic floor muscles are one large group; we also discussed that it is ok for these sphincter muscles can contract together. He did very well with transversus abdominus training and coordinating with pelvic floor muscle and breathing. He was able to incorporate into other exercises with cues to help improve form. HEP updated. He will continue to benefit from skilled PT intervention in order to increase bladder control, improve core strength, and begin/progress functional strengthening program.   OBJECTIVE IMPAIRMENTS: decreased activity tolerance, decreased coordination, decreased endurance, decreased mobility, decreased strength, increased fascial restrictions, increased muscle spasms, impaired tone, postural dysfunction, and pain.   ACTIVITY LIMITATIONS: continence  PARTICIPATION LIMITATIONS: cleaning, interpersonal relationship, community activity, and occupation, and exercise  PERSONAL FACTORS: 3+ comorbidities: medical history  are also affecting patient's functional outcome.   REHAB POTENTIAL: Good  CLINICAL DECISION MAKING: Evolving/moderate complexity  EVALUATION COMPLEXITY: Moderate   GOALS: Goals reviewed with patient? Yes  SHORT TERM GOALS: Target date: 03/17/2024   Pt will be independent with HEP.   Baseline: Goal status: INITIAL  2.  Pt will be able to teach back and utilize urge suppression technique in order to help reduce number of trips to the bathroom.    Baseline:  Goal status: INITIAL  3.  Pt will be independent with use of double voiding in order to help complete bladder emptying and avoid post-void dribbling.  Baseline:  Goal status: INITIAL  4.  Pt will report 25% improvement in urinary incontinence.  Baseline:  Goal status: INITIAL   LONG TERM GOALS: Target date: 05/12/24  Pt will be independent with advanced HEP.   Baseline:  Goal status: INITIAL  2.   Pt will report 75% improvement in urinary incontinence.  Baseline:  Goal status: INITIAL  3.  Pt will increase pelvic floor muscle strength to at least 3/5.  Baseline:  Goal status: INITIAL  4.  Pt will decrease PFIQ-7 score to <30 in order to demonstrate functional improvement.  Baseline:  Goal status: INITIAL  5.  Pt will be able to teach back and utilize urge suppression technique in order to help reduce number of  trips to the bathroom.    Baseline:  Goal status: INITIAL    PLAN:  PT FREQUENCY: 1-2x/week  PT DURATION: 12 weeks  PLANNED INTERVENTIONS: 97110-Therapeutic exercises, 97530- Therapeutic activity, 97112- Neuromuscular re-education, 97535- Self Care, 16109- Manual therapy, Dry Needling, and Biofeedback  PLAN FOR NEXT SESSION: Begin core strengthening.    Julio Alm, PT, DPT03/06/258:45 AM

## 2024-03-03 ENCOUNTER — Ambulatory Visit: Payer: PPO

## 2024-03-10 ENCOUNTER — Ambulatory Visit: Payer: PPO

## 2024-03-10 DIAGNOSIS — R293 Abnormal posture: Secondary | ICD-10-CM

## 2024-03-10 DIAGNOSIS — M6281 Muscle weakness (generalized): Secondary | ICD-10-CM | POA: Diagnosis not present

## 2024-03-10 DIAGNOSIS — R279 Unspecified lack of coordination: Secondary | ICD-10-CM

## 2024-03-10 NOTE — Therapy (Signed)
 OUTPATIENT PHYSICAL THERAPY MALE PELVIC TREATMENT   Patient Name: John Wagner. MRN: [id number] DOB:March 09, 1951, 73 y.o., male Today's Date: 03/10/2024  END OF SESSION:  PT End of Session - 03/10/24 0934     Visit Number 3    Date for PT Re-Evaluation 05/12/24    Authorization Type HTA    PT Start Time 0930    PT Stop Time 1010    PT Time Calculation (min) 40 min    Activity Tolerance Patient tolerated treatment well    Behavior During Therapy WFL for tasks assessed/performed              Past Medical History:  Diagnosis Date   Allergy    Ankylosis, left knee    post TKA   Arthritis    BPH (benign prostatic hyperplasia)    Complication of anesthesia    per pt causes memory issues post op   ED (erectile dysfunction)    GAD (generalized anxiety disorder)    History of adenomatous polyp of colon    Hyperlipidemia    Hypertension    Malignant neoplasm prostate (HCC) 09/2021   urologist--- dr newsome/  radiation oncologist--- dr Kathrynn Running;  dx 10/ 2022, Gleason, 3+4;   01-21-2022  radioactive prostate seed implants   Postural dizziness with presyncope 07/2021   cardiologist evalution by dr Servando Salina;  tested results in epic ---- event monitor 07-24-2021  shows SR w/ rare PAC/ PVC;  echo 08-13-2021  ef 55-60%, GIDD, mild MR/ AR,, AA 3.9cm,  ETT 08-13-2017 no ischemia   Pre-diabetes    Psoriasis    Past Surgical History:  Procedure Laterality Date   APPENDECTOMY     age 34   COLONOSCOPY WITH PROPOFOL  09/30/2022   dr Marca Ancona   JOINT REPLACEMENT  01/07/2023   KNEE CLOSED REDUCTION Left 03/02/2023   Procedure: CLOSED MANIPULATION KNEE;  Surgeon: Joen Laura, MD;  Location: [facility];  Service: Orthopedics;  Laterality: Left;   RADIOACTIVE SEED IMPLANT N/A 01/21/2022   Procedure: RADIOACTIVE SEED IMPLANT/BRACHYTHERAPY IMPLANT;  Surgeon: Belva Agee, MD;  Location: [facility];  Service: Urology;  Laterality: N/A;  90 MINS   SPACE  OAR INSTILLATION N/A 01/21/2022   Procedure: SPACE OAR INSTILLATION;  Surgeon: Belva Agee, MD;  Location: [facility];  Service: Urology;  Laterality: N/A;   TOTAL KNEE ARTHROPLASTY Left 01/07/2023   @ [facility] by dr Blanchie Dessert   Patient Active Problem List   Diagnosis Date Noted   Prediabetes 02/11/2022   Overweight (BMI 25.0-29.9) 02/11/2022   Malignant neoplasm of prostate (HCC) 10/25/2021   Elevated PSA    BPH (benign prostatic hyperplasia) 12/25/2014   Erectile dysfunction 03/10/2014   Anxiety 12/06/2012   Psoriasis 12/06/2012   Hypertension    Hyperlipidemia     PCP: Copland, Gwenlyn Found, MD   REFERRING PROVIDER: Pearline Cables, MD   REFERRING DIAG: C61 (ICD-10-CM) - Prostate cancer (HCC)  THERAPY DIAG:  Muscle weakness (generalized)  Unspecified lack of coordination  Abnormal posture  Rationale for Evaluation and Treatment: Rehabilitation  ONSET DATE: 2022  SUBJECTIVE:  SUBJECTIVE STATEMENT: Pt states that he did not make it last week because he was not sleeping and had issues with bowels and diarrhea. This started with constipation. He has been working on some exercises.   PAIN:  Are you having pain? No   PRECAUTIONS: None  RED FLAGS: None   WEIGHT BEARING RESTRICTIONS: No  FALLS:  Has patient fallen in last 6 months? 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  ACTIVITY LEVEL : plays the bag pipe, but no other exercise   PLOF: Independent  PATIENT GOALS: better bladder control   PERTINENT HISTORY:  Lt TKA, Lt closed manipulation of Lt knee, appendectomy, benign prostatic hyperplasia, erectile dysfunction, anxiety, malignant neoplasm of prostate treated with radioactive seed implantation 2022 (remission), psoriasis  BOWEL  MOVEMENT: Pain with bowel movement: No Type of bowel movement:Frequency 1x/day and Strain no Fully empty rectum: Yes:   Leakage: No unless he is sick Pads: No Fiber supplement/laxative No  URINATION: Pain with urination: No Fully empty bladder: No Stream: Weak Urgency: Yes  Frequency: couple times a day;  Leakage: Urge to void and Walking to the bathroom, walking down stairs Pads: No, keeps tissue in his pocket  INTERCOURSE: Pain with intercourse: none Erection: he will initially get erection with medication, but then cannot maintain  Ejaculation: difficult to find the right stimulation for orgasm    OBJECTIVE:  Note: Objective measures were completed at Evaluation unless otherwise noted. 02/18/24: PATIENT SURVEYS:   PFIQ-7: 74  COGNITION: Overall cognitive status: Within functional limits for tasks assessed     SENSATION: Light touch: Appears intact  FUNCTIONAL TESTS:  Squat: Rt weight shift, large Rt pelvic drop in squat, bil LE ER Single leg stance:  Rt: Lt pelvic drop  Lt: Compensated trendelenburg Curl-up test: 3 finger width distortion    GAIT: Assistive device utilized: None Comments: slow gait, bil LE ER  POSTURE: rounded shoulders, forward head, decreased lumbar lordosis, increased thoracic kyphosis, and posterior pelvic tilt   LUMBARAROM/PROM:  A/PROM A/PROM  Eval (% available)  Flexion 100  Extension 50  Right lateral flexion 100  Left lateral flexion 100  Right rotation 100  Left rotation 100   (Blank rows = not tested)   PALPATION:  Abdominal: apical breathing pattern, decreased rib cage mobility on Lt                External Perineal Exam: redness and irritation - tissue looks thin                              Internal Pelvic Floor: some tenderness   Patient confirms identification and approves PT to assess internal pelvic floor and treatment Yes  PELVIC MMT:   MMT eval  Internal Anal Sphincter 2/5  External Anal Sphincter 2/5   Puborectalis 2/5  Diastasis Recti 2 finger width separation above umbilicus  (Blank rows = not tested)        TONE: Low   TODAY'S TREATMENT:  DATE:  03/10/24 Neuromuscular re-education: Bridge with hip adduction, transversus abdominus, and pelvic floor muscle 2 x 10 Supine hip adduction ball press with transversus abdominus and pelvic floor muscle contractions and breath coordination 10x Supine bridge with hip abduction red band 2 x 10 Supine leg extensions 10x bil Seated single leg clam red band 2 x 10 bil Seated horizontal abduction pull apart red band 2 x 10 Exercises: Single knee to chest 5x bil Lower trunk rotation 2 x 10  02/25/24 Neuromuscular re-education: Pelvic floor muscle contraction training in supine with external perineal tactile cues Transversus abdominus training with multimodal cues for improved motor control and breath coordination  Transversus abdominus isometrics 10x Bil supine UE ball press with transversus abdominus and pelvic floor muscle contractions and breath coordination 10x Supine hip adduction ball press with transversus abdominus and pelvic floor muscle contractions and breath coordination 10x Bridge with hip adduction, transversus abdominus, and pelvic floor muscle 2 x 10 Seated hip adduction ball press with transversus abdominus and pelvic floor muscle 2 x 10 Seated hip abduction red band with transversus abdominus and pelvic floor muscle 2 x 10 Seated resisted march red band with transversus abdominus and pelvic floor muscle 2 x 10   02/18/24  EVAL  Neuromuscular re-education: Pt provides verbal consent for internal rectal pelvic floor exam. Internal rectal pelvic floor muscle contraction training  Quick flicks Long holds Urge drill  PATIENT EDUCATION:  Education details: See above Person educated: Patient Education  method: Programmer, multimedia, Demonstration, Actor cues, Verbal cues, and Handouts Education comprehension: verbalized understanding  HOME EXERCISE PROGRAM: [id number]  ASSESSMENT:  CLINICAL IMPRESSION: With exception of being sick with constipation and then diarrhea last week, pt is doing better with control. He reported feeling good with exercise progressions today. We reviewed how to perform bridges correctly and added in abduction bridge. This really helped him be able to feel pelvic floor muscle contraction with excellent breath coordination. He will continue to benefit from skilled PT intervention in order to increase bladder control, improve core strength, and begin/progress functional strengthening program.   OBJECTIVE IMPAIRMENTS: decreased activity tolerance, decreased coordination, decreased endurance, decreased mobility, decreased strength, increased fascial restrictions, increased muscle spasms, impaired tone, postural dysfunction, and pain.   ACTIVITY LIMITATIONS: continence  PARTICIPATION LIMITATIONS: cleaning, interpersonal relationship, community activity, and occupation, and exercise  PERSONAL FACTORS: 3+ comorbidities: medical history  are also affecting patient's functional outcome.   REHAB POTENTIAL: Good  CLINICAL DECISION MAKING: Evolving/moderate complexity  EVALUATION COMPLEXITY: Moderate   GOALS: Goals reviewed with patient? Yes  SHORT TERM GOALS: Target date: 03/17/2024   Pt will be independent with HEP.   Baseline: Goal status: INITIAL  2.  Pt will be able to teach back and utilize urge suppression technique in order to help reduce number of trips to the bathroom.    Baseline:  Goal status: INITIAL  3.  Pt will be independent with use of double voiding in order to help complete bladder emptying and avoid post-void dribbling.  Baseline:  Goal status: INITIAL  4.  Pt will report 25% improvement in urinary incontinence.  Baseline:  Goal status:  INITIAL   LONG TERM GOALS: Target date: 05/12/24  Pt will be independent with advanced HEP.   Baseline:  Goal status: INITIAL  2.  Pt will report 75% improvement in urinary incontinence.  Baseline:  Goal status: INITIAL  3.  Pt will increase pelvic floor muscle strength to at least 3/5.  Baseline:  Goal status: INITIAL  4.  Pt will decrease PFIQ-7 score to <30 in order to demonstrate functional improvement.  Baseline:  Goal status: INITIAL  5.  Pt will be able to teach back and utilize urge suppression technique in order to help reduce number of trips to the bathroom.    Baseline:  Goal status: INITIAL    PLAN:  PT FREQUENCY: 1-2x/week  PT DURATION: 12 weeks  PLANNED INTERVENTIONS: 97110-Therapeutic exercises, 97530- Therapeutic activity, 97112- Neuromuscular re-education, 97535- Self Care, 30865- Manual therapy, Dry Needling, and Biofeedback  PLAN FOR NEXT SESSION: Progress core strengthening.    Julio Alm, PT, DPT03/20/2510:08 AM

## 2024-03-14 ENCOUNTER — Other Ambulatory Visit: Payer: Self-pay | Admitting: Family Medicine

## 2024-03-14 DIAGNOSIS — G8929 Other chronic pain: Secondary | ICD-10-CM

## 2024-03-17 ENCOUNTER — Ambulatory Visit: Payer: PPO

## 2024-03-17 DIAGNOSIS — R293 Abnormal posture: Secondary | ICD-10-CM

## 2024-03-17 DIAGNOSIS — R279 Unspecified lack of coordination: Secondary | ICD-10-CM

## 2024-03-17 DIAGNOSIS — M6281 Muscle weakness (generalized): Secondary | ICD-10-CM

## 2024-03-17 NOTE — Therapy (Signed)
 OUTPATIENT PHYSICAL THERAPY MALE PELVIC TREATMENT   Patient Name: John Wagner. MRN: [id number] DOB:Nov 19, 1951, 73 y.o., male Today's Date: 03/17/2024  END OF SESSION:  PT End of Session - 03/17/24 0934     Visit Number 4    Date for PT Re-Evaluation 05/12/24    Authorization Type HTA    PT Start Time 0930    PT Stop Time 1010    PT Time Calculation (min) 40 min    Activity Tolerance Patient tolerated treatment well    Behavior During Therapy WFL for tasks assessed/performed               Past Medical History:  Diagnosis Date   Allergy    Ankylosis, left knee    post TKA   Arthritis    BPH (benign prostatic hyperplasia)    Complication of anesthesia    per pt causes memory issues post op   ED (erectile dysfunction)    GAD (generalized anxiety disorder)    History of adenomatous polyp of colon    Hyperlipidemia    Hypertension    Malignant neoplasm prostate (HCC) 09/2021   urologist--- dr newsome/  radiation oncologist--- dr Kathrynn Running;  dx 10/ 2022, Gleason, 3+4;   01-21-2022  radioactive prostate seed implants   Postural dizziness with presyncope 07/2021   cardiologist evalution by dr Servando Salina;  tested results in epic ---- event monitor 07-24-2021  shows SR w/ rare PAC/ PVC;  echo 08-13-2021  ef 55-60%, GIDD, mild MR/ AR,, AA 3.9cm,  ETT 08-13-2017 no ischemia   Pre-diabetes    Psoriasis    Past Surgical History:  Procedure Laterality Date   APPENDECTOMY     age 51   COLONOSCOPY WITH PROPOFOL  09/30/2022   dr Marca Ancona   JOINT REPLACEMENT  01/07/2023   KNEE CLOSED REDUCTION Left 03/02/2023   Procedure: CLOSED MANIPULATION KNEE;  Surgeon: Joen Laura, MD;  Location: [facility];  Service: Orthopedics;  Laterality: Left;   RADIOACTIVE SEED IMPLANT N/A 01/21/2022   Procedure: RADIOACTIVE SEED IMPLANT/BRACHYTHERAPY IMPLANT;  Surgeon: Belva Agee, MD;  Location: [facility];  Service: Urology;  Laterality: N/A;  90 MINS    SPACE OAR INSTILLATION N/A 01/21/2022   Procedure: SPACE OAR INSTILLATION;  Surgeon: Belva Agee, MD;  Location: [facility];  Service: Urology;  Laterality: N/A;   TOTAL KNEE ARTHROPLASTY Left 01/07/2023   @ [facility] by dr Blanchie Dessert   Patient Active Problem List   Diagnosis Date Noted   Prediabetes 02/11/2022   Overweight (BMI 25.0-29.9) 02/11/2022   Malignant neoplasm of prostate (HCC) 10/25/2021   Elevated PSA    BPH (benign prostatic hyperplasia) 12/25/2014   Erectile dysfunction 03/10/2014   Anxiety 12/06/2012   Psoriasis 12/06/2012   Hypertension    Hyperlipidemia     PCP: Copland, Gwenlyn Found, MD   REFERRING PROVIDER: Pearline Cables, MD   REFERRING DIAG: C61 (ICD-10-CM) - Prostate cancer (HCC)  THERAPY DIAG:  Muscle weakness (generalized)  Unspecified lack of coordination  Abnormal posture  Rationale for Evaluation and Treatment: Rehabilitation  ONSET DATE: 2022  SUBJECTIVE:  SUBJECTIVE STATEMENT: Pt states that he is having a better week this week.   PAIN:  Are you having pain? No   PRECAUTIONS: None  RED FLAGS: None   WEIGHT BEARING RESTRICTIONS: No  FALLS:  Has patient fallen in last 6 months? 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  ACTIVITY LEVEL : plays the bag pipe, but no other exercise   PLOF: Independent  PATIENT GOALS: better bladder control   PERTINENT HISTORY:  Lt TKA, Lt closed manipulation of Lt knee, appendectomy, benign prostatic hyperplasia, erectile dysfunction, anxiety, malignant neoplasm of prostate treated with radioactive seed implantation 2022 (remission), psoriasis  BOWEL MOVEMENT: Pain with bowel movement: No Type of bowel movement:Frequency 1x/day and Strain no Fully empty rectum: Yes:    Leakage: No unless he is sick Pads: No Fiber supplement/laxative No  URINATION: Pain with urination: No Fully empty bladder: No Stream: Weak Urgency: Yes  Frequency: couple times a day;  Leakage: Urge to void and Walking to the bathroom, walking down stairs Pads: No, keeps tissue in his pocket  INTERCOURSE: Pain with intercourse: none Erection: he will initially get erection with medication, but then cannot maintain  Ejaculation: difficult to find the right stimulation for orgasm    OBJECTIVE:  Note: Objective measures were completed at Evaluation unless otherwise noted. 02/18/24: PATIENT SURVEYS:   PFIQ-7: 82  COGNITION: Overall cognitive status: Within functional limits for tasks assessed     SENSATION: Light touch: Appears intact  FUNCTIONAL TESTS:  Squat: Rt weight shift, large Rt pelvic drop in squat, bil LE ER Single leg stance:  Rt: Lt pelvic drop  Lt: Compensated trendelenburg Curl-up test: 3 finger width distortion    GAIT: Assistive device utilized: None Comments: slow gait, bil LE ER  POSTURE: rounded shoulders, forward head, decreased lumbar lordosis, increased thoracic kyphosis, and posterior pelvic tilt   LUMBARAROM/PROM:  A/PROM A/PROM  Eval (% available)  Flexion 100  Extension 50  Right lateral flexion 100  Left lateral flexion 100  Right rotation 100  Left rotation 100   (Blank rows = not tested)   PALPATION:  Abdominal: apical breathing pattern, decreased rib cage mobility on Lt                External Perineal Exam: redness and irritation - tissue looks thin                              Internal Pelvic Floor: some tenderness   Patient confirms identification and approves PT to assess internal pelvic floor and treatment Yes  PELVIC MMT:   MMT eval  Internal Anal Sphincter 2/5  External Anal Sphincter 2/5  Puborectalis 2/5  Diastasis Recti 2 finger width separation above umbilicus  (Blank rows = not tested)         TONE: Low   TODAY'S TREATMENT:  DATE:  03/17/24 Neuromuscular re-education: Seated resisted rotation red band 2 x 10 Seated horizontal abduction red band 2 x 10 Exercises: Heel raises 3 x 10 3 way kick 10x each bil Therapeutic activities: Squats 2 x 10 to table Standing rows red band 3 x 10 Standing shoulder extensions red band 3 x 10 Pallof press red band2 x 10 bil   03/10/24 Neuromuscular re-education: Bridge with hip adduction, transversus abdominus, and pelvic floor muscle 2 x 10 Supine hip adduction ball press with transversus abdominus and pelvic floor muscle contractions and breath coordination 10x Supine bridge with hip abduction red band 2 x 10 Supine leg extensions 10x bil Seated single leg clam red band 2 x 10 bil Seated horizontal abduction pull apart red band 2 x 10 Exercises: Single knee to chest 5x bil Lower trunk rotation 2 x 10  02/25/24 Neuromuscular re-education: Pelvic floor muscle contraction training in supine with external perineal tactile cues Transversus abdominus training with multimodal cues for improved motor control and breath coordination  Transversus abdominus isometrics 10x Bil supine UE ball press with transversus abdominus and pelvic floor muscle contractions and breath coordination 10x Supine hip adduction ball press with transversus abdominus and pelvic floor muscle contractions and breath coordination 10x Bridge with hip adduction, transversus abdominus, and pelvic floor muscle 2 x 10 Seated hip adduction ball press with transversus abdominus and pelvic floor muscle 2 x 10 Seated hip abduction red band with transversus abdominus and pelvic floor muscle 2 x 10 Seated resisted march red band with transversus abdominus and pelvic floor muscle 2 x 10    PATIENT EDUCATION:  Education details: See above Person educated:  Patient Education method: Programmer, multimedia, Demonstration, Actor cues, Verbal cues, and Handouts Education comprehension: verbalized understanding  HOME EXERCISE PROGRAM: [id number]  ASSESSMENT:  CLINICAL IMPRESSION: Pt states that he is having a better week. He is noticing some improvements in ability to contract pelvic floor and isolate anterior vs posterior pelvic floor muscles. He is doing very well with progressing into standing exercises, intuitively breathing correctly with activities in order to help facilitate appropriate deep core contraction and manage abdominal pressure better. He will continue to benefit from skilled PT intervention in order to increase bladder control, improve core strength, and begin/progress functional strengthening program.   OBJECTIVE IMPAIRMENTS: decreased activity tolerance, decreased coordination, decreased endurance, decreased mobility, decreased strength, increased fascial restrictions, increased muscle spasms, impaired tone, postural dysfunction, and pain.   ACTIVITY LIMITATIONS: continence  PARTICIPATION LIMITATIONS: cleaning, interpersonal relationship, community activity, and occupation, and exercise  PERSONAL FACTORS: 3+ comorbidities: medical history  are also affecting patient's functional outcome.   REHAB POTENTIAL: Good  CLINICAL DECISION MAKING: Evolving/moderate complexity  EVALUATION COMPLEXITY: Moderate   GOALS: Goals reviewed with patient? Yes  SHORT TERM GOALS: Target date: 03/17/2024   Pt will be independent with HEP.   Baseline: Goal status: INITIAL  2.  Pt will be able to teach back and utilize urge suppression technique in order to help reduce number of trips to the bathroom.    Baseline:  Goal status: INITIAL  3.  Pt will be independent with use of double voiding in order to help complete bladder emptying and avoid post-void dribbling.  Baseline:  Goal status: INITIAL  4.  Pt will report 25% improvement in urinary  incontinence.  Baseline:  Goal status: INITIAL   LONG TERM GOALS: Target date: 05/12/24  Pt will be independent with advanced HEP.   Baseline:  Goal status: INITIAL  2.  Pt  will report 75% improvement in urinary incontinence.  Baseline:  Goal status: INITIAL  3.  Pt will increase pelvic floor muscle strength to at least 3/5.  Baseline:  Goal status: INITIAL  4.  Pt will decrease PFIQ-7 score to <30 in order to demonstrate functional improvement.  Baseline:  Goal status: INITIAL  5.  Pt will be able to teach back and utilize urge suppression technique in order to help reduce number of trips to the bathroom.    Baseline:  Goal status: INITIAL    PLAN:  PT FREQUENCY: 1-2x/week  PT DURATION: 12 weeks  PLANNED INTERVENTIONS: 97110-Therapeutic exercises, 97530- Therapeutic activity, 97112- Neuromuscular re-education, 97535- Self Care, 16109- Manual therapy, Dry Needling, and Biofeedback  PLAN FOR NEXT SESSION: Progress core strengthening.    Julio Alm, PT, DPT03/27/2510:10 AM

## 2024-03-29 ENCOUNTER — Telehealth: Payer: Self-pay | Admitting: Family Medicine

## 2024-03-29 NOTE — Telephone Encounter (Signed)
 Copied from CRM [id number]. Topic: Medicare AWV >> Mar 29, 2024 10:35 AM Payton Doughty wrote: Reason for CRM: Called LVM 03/29/2024 to schedule AWV. Please schedule Virtual or Telehealth visits ONLY.   Verlee Rossetti; Care Guide Ambulatory Clinical Support [place] [facility] Direct Dial: [phone]

## 2024-04-04 ENCOUNTER — Ambulatory Visit: Attending: Family Medicine

## 2024-04-04 DIAGNOSIS — M6281 Muscle weakness (generalized): Secondary | ICD-10-CM | POA: Diagnosis not present

## 2024-04-04 DIAGNOSIS — R279 Unspecified lack of coordination: Secondary | ICD-10-CM | POA: Insufficient documentation

## 2024-04-04 DIAGNOSIS — R293 Abnormal posture: Secondary | ICD-10-CM | POA: Insufficient documentation

## 2024-04-04 DIAGNOSIS — C61 Malignant neoplasm of prostate: Secondary | ICD-10-CM | POA: Diagnosis not present

## 2024-04-04 NOTE — Therapy (Signed)
 OUTPATIENT PHYSICAL THERAPY MALE PELVIC TREATMENT   Patient Name: John Wagner. MRN: [id number] DOB:[birth year], 73 y.o., male Today's Date: 04/04/2024  END OF SESSION:  PT End of Session - 04/04/24 1105     Visit Number 5    Date for PT Re-Evaluation 05/12/24    Authorization Type HTA    Progress Note Due on Visit 10    PT Start Time 1100    PT Stop Time 1140    PT Time Calculation (min) 40 min    Activity Tolerance Patient tolerated treatment well    Behavior During Therapy WFL for tasks assessed/performed               Past Medical History:  Diagnosis Date   Allergy    Ankylosis, left knee    post TKA   Arthritis    BPH (benign prostatic hyperplasia)    Complication of anesthesia    per pt causes memory issues post op   ED (erectile dysfunction)    GAD (generalized anxiety disorder)    History of adenomatous polyp of colon    Hyperlipidemia    Hypertension    Malignant neoplasm prostate (HCC) 09/2021   urologist--- dr newsome/  radiation oncologist--- dr Lorri Rota;  dx 10/ 2022, Gleason, 3+4;   01-21-2022  radioactive prostate seed implants   Postural dizziness with presyncope 07/2021   cardiologist evalution by dr Emmette Harms;  tested results in epic ---- event monitor 07-24-2021  shows SR w/ rare PAC/ PVC;  echo 08-13-2021  ef 55-60%, GIDD, mild MR/ AR,, AA 3.9cm,  ETT 08-13-2017 no ischemia   Pre-diabetes    Psoriasis    Past Surgical History:  Procedure Laterality Date   APPENDECTOMY     age 48   COLONOSCOPY WITH PROPOFOL  09/30/2022   dr Feliberto Hopping   JOINT REPLACEMENT  01/07/2023   KNEE CLOSED REDUCTION Left 03/02/2023   Procedure: CLOSED MANIPULATION KNEE;  Surgeon: Murleen Arms, MD;  Location: [facility];  Service: Orthopedics;  Laterality: Left;   RADIOACTIVE SEED IMPLANT N/A 01/21/2022   Procedure: RADIOACTIVE SEED IMPLANT/BRACHYTHERAPY IMPLANT;  Surgeon: Sherlyn Ditto, MD;  Location: [facility];  Service: Urology;   Laterality: N/A;  90 MINS   SPACE OAR INSTILLATION N/A 01/21/2022   Procedure: SPACE OAR INSTILLATION;  Surgeon: Sherlyn Ditto, MD;  Location: [facility];  Service: Urology;  Laterality: N/A;   TOTAL KNEE ARTHROPLASTY Left 01/07/2023   @ [facility] by dr Pryor Browning   Patient Active Problem List   Diagnosis Date Noted   Prediabetes 02/11/2022   Overweight (BMI 25.0-29.9) 02/11/2022   Malignant neoplasm of prostate (HCC) 10/25/2021   Elevated PSA    BPH (benign prostatic hyperplasia) 12/25/2014   Erectile dysfunction 03/10/2014   Anxiety 12/06/2012   Psoriasis 12/06/2012   Hypertension    Hyperlipidemia     PCP: Copland, Skipper Dumas, MD   REFERRING PROVIDER: Kaylee Partridge, MD   REFERRING DIAG: C61 (ICD-10-CM) - Prostate cancer (HCC)  THERAPY DIAG:  Muscle weakness (generalized)  Unspecified lack of coordination  Abnormal posture  Rationale for Evaluation and Treatment: Rehabilitation  ONSET DATE: 2022  SUBJECTIVE:  SUBJECTIVE STATEMENT: Pt states that he is still dribbling on the way to the bathroom. He states that he is not working on exercises at home. He has been doing some of the pelvic floor muscle contractions when in the car.   PAIN:  Are you having pain? No   PRECAUTIONS: None  RED FLAGS: None   WEIGHT BEARING RESTRICTIONS: No  FALLS:  Has patient fallen in last 6 months? 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  ACTIVITY LEVEL : plays the bag pipe, but no other exercise   PLOF: Independent  PATIENT GOALS: better bladder control   PERTINENT HISTORY:  Lt TKA, Lt closed manipulation of Lt knee, appendectomy, benign prostatic hyperplasia, erectile dysfunction, anxiety, malignant neoplasm of prostate treated with radioactive seed  implantation 2022 (remission), psoriasis  BOWEL MOVEMENT: Pain with bowel movement: No Type of bowel movement:Frequency 1x/day and Strain no Fully empty rectum: Yes:   Leakage: No unless he is sick Pads: No Fiber supplement/laxative No  URINATION: Pain with urination: No Fully empty bladder: No Stream: Weak Urgency: Yes  Frequency: couple times a day;  Leakage: Urge to void and Walking to the bathroom, walking down stairs Pads: No, keeps tissue in his pocket  INTERCOURSE: Pain with intercourse: none Erection: he will initially get erection with medication, but then cannot maintain  Ejaculation: difficult to find the right stimulation for orgasm    OBJECTIVE:  Note: Objective measures were completed at Evaluation unless otherwise noted. 02/18/24: PATIENT SURVEYS:   PFIQ-7: 7  COGNITION: Overall cognitive status: Within functional limits for tasks assessed     SENSATION: Light touch: Appears intact  FUNCTIONAL TESTS:  Squat: Rt weight shift, large Rt pelvic drop in squat, bil LE ER Single leg stance:  Rt: Lt pelvic drop  Lt: Compensated trendelenburg Curl-up test: 3 finger width distortion    GAIT: Assistive device utilized: None Comments: slow gait, bil LE ER  POSTURE: rounded shoulders, forward head, decreased lumbar lordosis, increased thoracic kyphosis, and posterior pelvic tilt   LUMBARAROM/PROM:  A/PROM A/PROM  Eval (% available)  Flexion 100  Extension 50  Right lateral flexion 100  Left lateral flexion 100  Right rotation 100  Left rotation 100   (Blank rows = not tested)   PALPATION:  Abdominal: apical breathing pattern, decreased rib cage mobility on Lt                External Perineal Exam: redness and irritation - tissue looks thin                              Internal Pelvic Floor: some tenderness   Patient confirms identification and approves PT to assess internal pelvic floor and treatment Yes  PELVIC MMT:   MMT eval   Internal Anal Sphincter 2/5  External Anal Sphincter 2/5  Puborectalis 2/5  Diastasis Recti 2 finger width separation above umbilicus  (Blank rows = not tested)        TONE: Low   TODAY'S TREATMENT:  DATE:  04/04/24 Exercises: Heel raises 3 x 10 3 way kick 10x each bil Therapeutic activities: Reviewed possible benefits of sitting while urinating to help completely empty Using heel raises on the way to the bathroom to help improve control Review of urge drill  Squats 2 x 10 to table Standing rows red band 3 x 10 Standing shoulder extensions red band 3 x 10 Pallof press red band2 x 10 bil   03/17/24 Neuromuscular re-education: Seated resisted rotation red band 2 x 10 Seated horizontal abduction red band 2 x 10 Exercises: Heel raises 3 x 10 3 way kick 10x each bil Therapeutic activities: Squats 2 x 10 to table Standing rows red band 3 x 10 Standing shoulder extensions red band 3 x 10 Pallof press red band2 x 10 bil   03/10/24 Neuromuscular re-education: Bridge with hip adduction, transversus abdominus, and pelvic floor muscle 2 x 10 Supine hip adduction ball press with transversus abdominus and pelvic floor muscle contractions and breath coordination 10x Supine bridge with hip abduction red band 2 x 10 Supine leg extensions 10x bil Seated single leg clam red band 2 x 10 bil Seated horizontal abduction pull apart red band 2 x 10 Exercises: Single knee to chest 5x bil Lower trunk rotation 2 x 10   PATIENT EDUCATION:  Education details: See above Person educated: Patient Education method: Programmer, multimedia, Demonstration, Tactile cues, Verbal cues, and Handouts Education comprehension: verbalized understanding  HOME EXERCISE PROGRAM: [id number]  ASSESSMENT:  CLINICAL IMPRESSION: Pt is still having difficulty with dribbling on the way to the  bathroom, but he has not had any large leaks. We reviewed urge drill, sitting to urinate, and performing heel raises on the way to the bathroom to help stop th episodes of urinary incontinence. However, he was encouraged that having smaller leaks is a good sign of progress. We went over all exercises that we did last time with breath coordination and HEP was updated with specific instructions for breathing and pelvic floor contraction. We discussed importance of performing exercises at home in order to see faster progress. He will continue to benefit from skilled PT intervention in order to increase bladder control, improve core strength, and begin/progress functional strengthening program.   OBJECTIVE IMPAIRMENTS: decreased activity tolerance, decreased coordination, decreased endurance, decreased mobility, decreased strength, increased fascial restrictions, increased muscle spasms, impaired tone, postural dysfunction, and pain.   ACTIVITY LIMITATIONS: continence  PARTICIPATION LIMITATIONS: cleaning, interpersonal relationship, community activity, and occupation, and exercise  PERSONAL FACTORS: 3+ comorbidities: medical history  are also affecting patient's functional outcome.   REHAB POTENTIAL: Good  CLINICAL DECISION MAKING: Evolving/moderate complexity  EVALUATION COMPLEXITY: Moderate   GOALS: Goals reviewed with patient? Yes  SHORT TERM GOALS: Target date: 03/17/2024 - updated 04/04/24   Pt will be independent with HEP.   Baseline: Goal status: MET 04/04/24  2.  Pt will be able to teach back and utilize urge suppression technique in order to help reduce number of trips to the bathroom.    Baseline:  Goal status: MET 04/04/24  3.  Pt will be independent with use of double voiding in order to help complete bladder emptying and avoid post-void dribbling.  Baseline:  Goal status: IN PROGRESS 04/04/24  4.  Pt will report 25% improvement in urinary incontinence.  Baseline:  Goal  status: MET 04/04/24   LONG TERM GOALS: Target date: 05/12/24 - updated 04/04/24  Pt will be independent with advanced HEP.   Baseline:  Goal status:  IN PROGRESS 04/04/24  2.  Pt will report 75% improvement in urinary incontinence.  Baseline:  Goal status:  IN PROGRESS 04/04/24  3.  Pt will increase pelvic floor muscle strength to at least 3/5.  Baseline:  Goal status:  IN PROGRESS 04/04/24  4.  Pt will decrease PFIQ-7 score to <30 in order to demonstrate functional improvement.  Baseline:  Goal status:  IN PROGRESS 04/04/24  5.  Pt will be able to teach back and utilize urge suppression technique in order to help reduce number of trips to the bathroom.    Baseline:  Goal status:  IN PROGRESS 04/04/24    PLAN:  PT FREQUENCY: 1-2x/week  PT DURATION: 12 weeks  PLANNED INTERVENTIONS: 97110-Therapeutic exercises, 97530- Therapeutic activity, 97112- Neuromuscular re-education, 97535- Self Care, 16109- Manual therapy, Dry Needling, and Biofeedback  PLAN FOR NEXT SESSION: Progress core strengthening.    Verlena Glenn, PT, DPT04/14/2511:41 AM

## 2024-04-09 ENCOUNTER — Other Ambulatory Visit: Payer: Self-pay | Admitting: Family Medicine

## 2024-04-09 DIAGNOSIS — F411 Generalized anxiety disorder: Secondary | ICD-10-CM

## 2024-04-11 DIAGNOSIS — N401 Enlarged prostate with lower urinary tract symptoms: Secondary | ICD-10-CM | POA: Diagnosis not present

## 2024-04-11 DIAGNOSIS — C61 Malignant neoplasm of prostate: Secondary | ICD-10-CM | POA: Diagnosis not present

## 2024-04-11 DIAGNOSIS — R3912 Poor urinary stream: Secondary | ICD-10-CM | POA: Diagnosis not present

## 2024-04-27 ENCOUNTER — Ambulatory Visit: Attending: Family Medicine

## 2024-04-27 DIAGNOSIS — M6281 Muscle weakness (generalized): Secondary | ICD-10-CM | POA: Diagnosis not present

## 2024-04-27 DIAGNOSIS — R279 Unspecified lack of coordination: Secondary | ICD-10-CM | POA: Insufficient documentation

## 2024-04-27 DIAGNOSIS — R293 Abnormal posture: Secondary | ICD-10-CM | POA: Insufficient documentation

## 2024-04-27 NOTE — Therapy (Signed)
 OUTPATIENT PHYSICAL THERAPY MALE PELVIC TREATMENT   Patient Name: John Wagner. MRN: [id number] DOB:[birth year], 73 y.o., male Today's Date: 04/27/2024  END OF SESSION:  PT End of Session - 04/27/24 1058     Visit Number 6    Date for PT Re-Evaluation 05/12/24    Authorization Type HTA    Progress Note Due on Visit 10    PT Start Time 1100    PT Stop Time 1140    PT Time Calculation (min) 40 min    Activity Tolerance Patient tolerated treatment well    Behavior During Therapy WFL for tasks assessed/performed               Past Medical History:  Diagnosis Date   Allergy    Ankylosis, left knee    post TKA   Arthritis    BPH (benign prostatic hyperplasia)    Complication of anesthesia    per pt causes memory issues post op   ED (erectile dysfunction)    GAD (generalized anxiety disorder)    History of adenomatous polyp of colon    Hyperlipidemia    Hypertension    Malignant neoplasm prostate (HCC) 09/2021   urologist--- dr newsome/  radiation oncologist--- dr Lorri Rota;  dx 10/ 2022, Gleason, 3+4;   01-21-2022  radioactive prostate seed implants   Postural dizziness with presyncope 07/2021   cardiologist evalution by dr Emmette Harms;  tested results in epic ---- event monitor 07-24-2021  shows SR w/ rare PAC/ PVC;  echo 08-13-2021  ef 55-60%, GIDD, mild MR/ AR,, AA 3.9cm,  ETT 08-13-2017 no ischemia   Pre-diabetes    Psoriasis    Past Surgical History:  Procedure Laterality Date   APPENDECTOMY     age 85   COLONOSCOPY WITH PROPOFOL   09/30/2022   dr Feliberto Hopping   JOINT REPLACEMENT  01/07/2023   KNEE CLOSED REDUCTION Left 03/02/2023   Procedure: CLOSED MANIPULATION KNEE;  Surgeon: Murleen Arms, MD;  Location: [facility];  Service: Orthopedics;  Laterality: Left;   RADIOACTIVE SEED IMPLANT N/A 01/21/2022   Procedure: RADIOACTIVE SEED IMPLANT/BRACHYTHERAPY IMPLANT;  Surgeon: Sherlyn Ditto, MD;  Location: [facility];  Service: Urology;   Laterality: N/A;  90 MINS   SPACE OAR INSTILLATION N/A 01/21/2022   Procedure: SPACE OAR INSTILLATION;  Surgeon: Sherlyn Ditto, MD;  Location: [facility];  Service: Urology;  Laterality: N/A;   TOTAL KNEE ARTHROPLASTY Left 01/07/2023   @ [facility] by dr Pryor Browning   Patient Active Problem List   Diagnosis Date Noted   Prediabetes 02/11/2022   Overweight (BMI 25.0-29.9) 02/11/2022   Malignant neoplasm of prostate (HCC) 10/25/2021   Elevated PSA    BPH (benign prostatic hyperplasia) 12/25/2014   Erectile dysfunction 03/10/2014   Anxiety 12/06/2012   Psoriasis 12/06/2012   Hypertension    Hyperlipidemia     PCP: Copland, Skipper Dumas, MD   REFERRING PROVIDER: Kaylee Partridge, MD   REFERRING DIAG: C61 (ICD-10-CM) - Prostate cancer (HCC)  THERAPY DIAG:  Muscle weakness (generalized)  Unspecified lack of coordination  Abnormal posture  Rationale for Evaluation and Treatment: Rehabilitation  ONSET DATE: 2022  SUBJECTIVE:  SUBJECTIVE STATEMENT: Pt states that he is still having trouble with dribbling after urination, but he is not leaning on the wall or sitting down to urinate. He is having issues with bowel movements.   PAIN:  Are you having pain? No   PRECAUTIONS: None  RED FLAGS: None   WEIGHT BEARING RESTRICTIONS: No  FALLS:  Has patient fallen in last 6 months? 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  ACTIVITY LEVEL : plays the bag pipe, but no other exercise   PLOF: Independent  PATIENT GOALS: better bladder control   PERTINENT HISTORY:  Lt TKA, Lt closed manipulation of Lt knee, appendectomy, benign prostatic hyperplasia, erectile dysfunction, anxiety, malignant neoplasm of prostate treated with radioactive seed implantation 2022  (remission), psoriasis  BOWEL MOVEMENT: Pain with bowel movement: No Type of bowel movement:Frequency 1x/day and Strain no Fully empty rectum: Yes:   Leakage: No unless he is sick Pads: No Fiber supplement/laxative No  URINATION: Pain with urination: No Fully empty bladder: No Stream: Weak Urgency: Yes  Frequency: couple times a day;  Leakage: Urge to void and Walking to the bathroom, walking down stairs Pads: No, keeps tissue in his pocket  INTERCOURSE: Pain with intercourse: none Erection: he will initially get erection with medication, but then cannot maintain  Ejaculation: difficult to find the right stimulation for orgasm    OBJECTIVE:  Note: Objective measures were completed at Evaluation unless otherwise noted. 02/18/24: PATIENT SURVEYS:   PFIQ-7: 75  COGNITION: Overall cognitive status: Within functional limits for tasks assessed     SENSATION: Light touch: Appears intact  FUNCTIONAL TESTS:  Squat: Rt weight shift, large Rt pelvic drop in squat, bil LE ER Single leg stance:  Rt: Lt pelvic drop  Lt: Compensated trendelenburg Curl-up test: 3 finger width distortion    GAIT: Assistive device utilized: None Comments: slow gait, bil LE ER  POSTURE: rounded shoulders, forward head, decreased lumbar lordosis, increased thoracic kyphosis, and posterior pelvic tilt   LUMBARAROM/PROM:  A/PROM A/PROM  Eval (% available)  Flexion 100  Extension 50  Right lateral flexion 100  Left lateral flexion 100  Right rotation 100  Left rotation 100   (Blank rows = not tested)   PALPATION:  Abdominal: apical breathing pattern, decreased rib cage mobility on Lt                External Perineal Exam: redness and irritation - tissue looks thin                              Internal Pelvic Floor: some tenderness   Patient confirms identification and approves PT to assess internal pelvic floor and treatment Yes  PELVIC MMT:   MMT eval  Internal Anal Sphincter  2/5  External Anal Sphincter 2/5  Puborectalis 2/5  Diastasis Recti 2 finger width separation above umbilicus  (Blank rows = not tested)        TONE: Low   TODAY'S TREATMENT:  DATE:  04/27/24 Neuromuscular re-education: Airex beam walking and backward 8 laps 3 way kick standing on airex 10x each, bil Therapeutic activities: Squatty potty Relaxed toilet mechanics Exhale with pushing/lengthening pelvic floor - balloon Sitting or leaning on wall to urinate Nu-step level 4 6 minutes Forward walk outs 3 lbs bil 6x Backward walk outs 3 lbs bil 6x Chop 3lbs 12x bil   04/04/24 Exercises: Heel raises 3 x 10 3 way kick 10x each bil Therapeutic activities: Reviewed possible benefits of sitting while urinating to help completely empty Using heel raises on the way to the bathroom to help improve control Review of urge drill  Squats 2 x 10 to table Standing rows red band 3 x 10 Standing shoulder extensions red band 3 x 10 Pallof press red band2 x 10 bil   03/17/24 Neuromuscular re-education: Seated resisted rotation red band 2 x 10 Seated horizontal abduction red band 2 x 10 Exercises: Heel raises 3 x 10 3 way kick 10x each bil Therapeutic activities: Squats 2 x 10 to table Standing rows red band 3 x 10 Standing shoulder extensions red band 3 x 10 Pallof press red band2 x 10 bil   PATIENT EDUCATION:  Education details: See above Person educated: Patient Education method: Explanation, Demonstration, Tactile cues, Verbal cues, and Handouts Education comprehension: verbalized understanding  HOME EXERCISE PROGRAM: [id number]  ASSESSMENT:  CLINICAL IMPRESSION: Pt is doing better with urinary incontinence, but still dribbles after he voids. We continued to discuss that leaning against wall in front of him or sitting down should really help more complete  emptying. We also discussed improving fiber intake, squatty potty, relaxed toilet mechanics, and exhale with pushing to help improve bowel movements. We focused on functional strengthening today. He is having difficulty with endurance and required frequent rest breaks; he was encouraged to rest when needed or modify exercises instead of just stopping an exercise when he gets tired. He will continue to benefit from skilled PT intervention in order to increase bladder control, improve core strength, and begin/progress functional strengthening program.   OBJECTIVE IMPAIRMENTS: decreased activity tolerance, decreased coordination, decreased endurance, decreased mobility, decreased strength, increased fascial restrictions, increased muscle spasms, impaired tone, postural dysfunction, and pain.   ACTIVITY LIMITATIONS: continence  PARTICIPATION LIMITATIONS: cleaning, interpersonal relationship, community activity, and occupation, and exercise  PERSONAL FACTORS: 3+ comorbidities: medical history  are also affecting patient's functional outcome.   REHAB POTENTIAL: Good  CLINICAL DECISION MAKING: Evolving/moderate complexity  EVALUATION COMPLEXITY: Moderate   GOALS: Goals reviewed with patient? Yes  SHORT TERM GOALS: Target date: 03/17/2024 - updated 04/04/24   Pt will be independent with HEP.   Baseline: Goal status: MET 04/04/24  2.  Pt will be able to teach back and utilize urge suppression technique in order to help reduce number of trips to the bathroom.    Baseline:  Goal status: MET 04/04/24  3.  Pt will be independent with use of double voiding in order to help complete bladder emptying and avoid post-void dribbling.  Baseline:  Goal status: IN PROGRESS 04/04/24  4.  Pt will report 25% improvement in urinary incontinence.  Baseline:  Goal status: MET 04/04/24   LONG TERM GOALS: Target date: 05/12/24 - updated 04/04/24  Pt will be independent with advanced HEP.   Baseline:  Goal  status:  IN PROGRESS 04/04/24  2.  Pt will report 75% improvement in urinary incontinence.  Baseline:  Goal status:  IN PROGRESS 04/04/24  3.  Pt will increase pelvic  floor muscle strength to at least 3/5.  Baseline:  Goal status:  IN PROGRESS 04/04/24  4.  Pt will decrease PFIQ-7 score to <30 in order to demonstrate functional improvement.  Baseline:  Goal status:  IN PROGRESS 04/04/24  5.  Pt will be able to teach back and utilize urge suppression technique in order to help reduce number of trips to the bathroom.    Baseline:  Goal status:  IN PROGRESS 04/04/24    PLAN:  PT FREQUENCY: 1-2x/week  PT DURATION: 12 weeks  PLANNED INTERVENTIONS: 97110-Therapeutic exercises, 97530- Therapeutic activity, 97112- Neuromuscular re-education, 97535- Self Care, 04540- Manual therapy, Dry Needling, and Biofeedback  PLAN FOR NEXT SESSION: Progress core strengthening.    Verlena Glenn, PT, DPT05/06/2509:59 AM

## 2024-05-03 ENCOUNTER — Ambulatory Visit

## 2024-05-03 DIAGNOSIS — M6281 Muscle weakness (generalized): Secondary | ICD-10-CM

## 2024-05-03 DIAGNOSIS — R279 Unspecified lack of coordination: Secondary | ICD-10-CM

## 2024-05-03 DIAGNOSIS — R293 Abnormal posture: Secondary | ICD-10-CM

## 2024-05-03 NOTE — Therapy (Signed)
 OUTPATIENT PHYSICAL THERAPY MALE PELVIC TREATMENT   Patient Name: John Wagner. MRN: [id number] DOB:17-Feb-1951, 73 y.o., male Today's Date: 05/03/2024  END OF SESSION:  PT End of Session - 05/03/24 1147     Visit Number 7    Date for PT Re-Evaluation 05/12/24    Authorization Type HTA    Progress Note Due on Visit 10    PT Start Time 1145    PT Stop Time 1225    PT Time Calculation (min) 40 min    Activity Tolerance Patient tolerated treatment well    Behavior During Therapy WFL for tasks assessed/performed               Past Medical History:  Diagnosis Date   Allergy    Ankylosis, left knee    post TKA   Arthritis    BPH (benign prostatic hyperplasia)    Complication of anesthesia    per pt causes memory issues post op   ED (erectile dysfunction)    GAD (generalized anxiety disorder)    History of adenomatous polyp of colon    Hyperlipidemia    Hypertension    Malignant neoplasm prostate (HCC) 09/2021   urologist--- dr newsome/  radiation oncologist--- dr Lorri Rota;  dx 10/ 2022, Gleason, 3+4;   01-21-2022  radioactive prostate seed implants   Postural dizziness with presyncope 07/2021   cardiologist evalution by dr Emmette Harms;  tested results in epic ---- event monitor 07-24-2021  shows SR w/ rare PAC/ PVC;  echo 08-13-2021  ef 55-60%, GIDD, mild MR/ AR,, AA 3.9cm,  ETT 08-13-2017 no ischemia   Pre-diabetes    Psoriasis    Past Surgical History:  Procedure Laterality Date   APPENDECTOMY     age 11   COLONOSCOPY WITH PROPOFOL   09/30/2022   dr Feliberto Hopping   JOINT REPLACEMENT  01/07/2023   KNEE CLOSED REDUCTION Left 03/02/2023   Procedure: CLOSED MANIPULATION KNEE;  Surgeon: Murleen Arms, MD;  Location: [facility] [place];  Service: Orthopedics;  Laterality: Left;   RADIOACTIVE SEED IMPLANT N/A 01/21/2022   Procedure: RADIOACTIVE SEED IMPLANT/BRACHYTHERAPY IMPLANT;  Surgeon: Sherlyn Ditto, MD;  Location: [facility];  Service: Urology;   Laterality: N/A;  90 MINS   SPACE OAR INSTILLATION N/A 01/21/2022   Procedure: SPACE OAR INSTILLATION;  Surgeon: Sherlyn Ditto, MD;  Location: [facility];  Service: Urology;  Laterality: N/A;   TOTAL KNEE ARTHROPLASTY Left 01/07/2023   @ [facility] by dr Pryor Browning   Patient Active Problem List   Diagnosis Date Noted   Prediabetes 02/11/2022   Overweight (BMI 25.0-29.9) 02/11/2022   Malignant neoplasm of prostate (HCC) 10/25/2021   Elevated PSA    BPH (benign prostatic hyperplasia) 12/25/2014   Erectile dysfunction 03/10/2014   Anxiety 12/06/2012   Psoriasis 12/06/2012   Hypertension    Hyperlipidemia     PCP: Copland, Skipper Dumas, MD   REFERRING PROVIDER: Kaylee Partridge, MD   REFERRING DIAG: C61 (ICD-10-CM) - Prostate cancer (HCC)  THERAPY DIAG:  Muscle weakness (generalized)  Unspecified lack of coordination  Abnormal posture  Rationale for Evaluation and Treatment: Rehabilitation  ONSET DATE: 2022  SUBJECTIVE:  SUBJECTIVE STATEMENT: Pt states that he had full episode of urinary incontinence yesterday. He states that he feels like this may have been due to wearing pants that were too tight. In general, he feels like his control is much better than it was. He feels like he is now contracting the correct muscles compared to when he first came in. He is returning to yoga.   PAIN:  Are you having pain? No   PRECAUTIONS: None  RED FLAGS: None   WEIGHT BEARING RESTRICTIONS: No  FALLS:  Has patient fallen in last 6 months? 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  ACTIVITY LEVEL : plays the bag pipe, but no other exercise   PLOF: Independent  PATIENT GOALS: better bladder control   PERTINENT HISTORY:  Lt TKA, Lt closed manipulation of Lt  knee, appendectomy, benign prostatic hyperplasia, erectile dysfunction, anxiety, malignant neoplasm of prostate treated with radioactive seed implantation 2022 (remission), psoriasis  BOWEL MOVEMENT: Pain with bowel movement: No Type of bowel movement:Frequency 1x/day and Strain no Fully empty rectum: Yes:   Leakage: No unless he is sick Pads: No Fiber supplement/laxative No  URINATION: Pain with urination: No Fully empty bladder: No Stream: Weak Urgency: Yes  Frequency: couple times a day;  Leakage: Urge to void and Walking to the bathroom, walking down stairs Pads: No, keeps tissue in his pocket  INTERCOURSE: Pain with intercourse: none Erection: he will initially get erection with medication, but then cannot maintain  Ejaculation: difficult to find the right stimulation for orgasm    OBJECTIVE:  Note: Objective measures were completed at Evaluation unless otherwise noted. 02/18/24: PATIENT SURVEYS:   PFIQ-7: 82  COGNITION: Overall cognitive status: Within functional limits for tasks assessed     SENSATION: Light touch: Appears intact  FUNCTIONAL TESTS:  Squat: Rt weight shift, large Rt pelvic drop in squat, bil LE ER Single leg stance:  Rt: Lt pelvic drop  Lt: Compensated trendelenburg Curl-up test: 3 finger width distortion    GAIT: Assistive device utilized: None Comments: slow gait, bil LE ER  POSTURE: rounded shoulders, forward head, decreased lumbar lordosis, increased thoracic kyphosis, and posterior pelvic tilt   LUMBARAROM/PROM:  A/PROM A/PROM  Eval (% available)  Flexion 100  Extension 50  Right lateral flexion 100  Left lateral flexion 100  Right rotation 100  Left rotation 100   (Blank rows = not tested)   PALPATION:  Abdominal: apical breathing pattern, decreased rib cage mobility on Lt                External Perineal Exam: redness and irritation - tissue looks thin                              Internal Pelvic Floor: some  tenderness   Patient confirms identification and approves PT to assess internal pelvic floor and treatment Yes  PELVIC MMT:   MMT eval  Internal Anal Sphincter 2/5  External Anal Sphincter 2/5  Puborectalis 2/5  Diastasis Recti 2 finger width separation above umbilicus  (Blank rows = not tested)        TONE: Low   TODAY'S TREATMENT:  DATE:  05/03/24 Neuromuscular re-education: BOSU step overs 2 x 10 Airex 3-way kick 10x each, bil Airex heel raises 3 x 10 Therapeutic activities: Review of urge drill Review of voiding schedule 2-3 hours Heel raises for urgency Backward walk outs 3lbs bil 10x Rows 7lbs bil 3 x 10   04/27/24 Neuromuscular re-education: Airex beam walking and backward 8 laps 3 way kick standing on airex 10x each, bil Therapeutic activities: Squatty potty Relaxed toilet mechanics Exhale with pushing/lengthening pelvic floor - balloon Sitting or leaning on wall to urinate Nu-step level 4 6 minutes Forward walk outs 3 lbs bil 6x Backward walk outs 3 lbs bil 6x Chop 3lbs 12x bil   04/04/24 Exercises: Heel raises 3 x 10 3 way kick 10x each bil Therapeutic activities: Reviewed possible benefits of sitting while urinating to help completely empty Using heel raises on the way to the bathroom to help improve control Review of urge drill  Squats 2 x 10 to table Standing rows red band 3 x 10 Standing shoulder extensions red band 3 x 10 Pallof press red band2 x 10 bil  PATIENT EDUCATION:  Education details: See above Person educated: Patient Education method: Explanation, Demonstration, Tactile cues, Verbal cues, and Handouts Education comprehension: verbalized understanding  HOME EXERCISE PROGRAM: [id number]  ASSESSMENT:  CLINICAL IMPRESSION: Pt had one large episode of urinary incontinence this week; he feels like it was  related to too much compression over bladder from tight pants. He does feel like he has gained a lot of control since starting PT. We reviewed some lifestyle intervention in order to help continue improving bladder control, like voiding schedule, urge drill, and use of heel raises for control with urgency. He was able to progress strengthening activities and reported that balance felt stronger when he was purposefully contracting pelvic floor muscles. He will continue to benefit from skilled PT intervention in order to increase bladder control, improve core strength, and begin/progress functional strengthening program.   OBJECTIVE IMPAIRMENTS: decreased activity tolerance, decreased coordination, decreased endurance, decreased mobility, decreased strength, increased fascial restrictions, increased muscle spasms, impaired tone, postural dysfunction, and pain.   ACTIVITY LIMITATIONS: continence  PARTICIPATION LIMITATIONS: cleaning, interpersonal relationship, community activity, and occupation, and exercise  PERSONAL FACTORS: 3+ comorbidities: medical history are also affecting patient's functional outcome.   REHAB POTENTIAL: Good  CLINICAL DECISION MAKING: Evolving/moderate complexity  EVALUATION COMPLEXITY: Moderate   GOALS: Goals reviewed with patient? Yes  SHORT TERM GOALS: Target date: 03/17/2024 - updated 04/04/24   Pt will be independent with HEP.   Baseline: Goal status: MET 04/04/24  2.  Pt will be able to teach back and utilize urge suppression technique in order to help reduce number of trips to the bathroom.    Baseline:  Goal status: MET 04/04/24  3.  Pt will be independent with use of double voiding in order to help complete bladder emptying and avoid post-void dribbling.  Baseline:  Goal status: IN PROGRESS 04/04/24  4.  Pt will report 25% improvement in urinary incontinence.  Baseline:  Goal status: MET 04/04/24   LONG TERM GOALS: Target date: 05/12/24 - updated  04/04/24  Pt will be independent with advanced HEP.   Baseline:  Goal status:  IN PROGRESS 04/04/24  2.  Pt will report 75% improvement in urinary incontinence.  Baseline:  Goal status:  IN PROGRESS 04/04/24  3.  Pt will increase pelvic floor muscle strength to at least 3/5.  Baseline:  Goal status:  IN PROGRESS 04/04/24  4.  Pt will decrease PFIQ-7 score to <30 in order to demonstrate functional improvement.  Baseline:  Goal status:  IN PROGRESS 04/04/24  5.  Pt will be able to teach back and utilize urge suppression technique in order to help reduce number of trips to the bathroom.    Baseline:  Goal status:  IN PROGRESS 04/04/24    PLAN:  PT FREQUENCY: 1-2x/week  PT DURATION: 12 weeks  PLANNED INTERVENTIONS: 97110-Therapeutic exercises, 97530- Therapeutic activity, 97112- Neuromuscular re-education, 97535- Self Care, 16109- Manual therapy, Dry Needling, and Biofeedback  PLAN FOR NEXT SESSION: Progress core strengthening.    Verlena Glenn, PT, DPT05/13/2512:27 PM

## 2024-05-04 ENCOUNTER — Encounter

## 2024-05-09 ENCOUNTER — Ambulatory Visit

## 2024-05-09 DIAGNOSIS — R293 Abnormal posture: Secondary | ICD-10-CM

## 2024-05-09 DIAGNOSIS — M6281 Muscle weakness (generalized): Secondary | ICD-10-CM | POA: Diagnosis not present

## 2024-05-09 DIAGNOSIS — R279 Unspecified lack of coordination: Secondary | ICD-10-CM

## 2024-05-09 NOTE — Therapy (Signed)
 OUTPATIENT PHYSICAL THERAPY MALE PELVIC TREATMENT   Patient Name: John Wagner. MRN: [id number] DOB:[birth year], 73 y.o., male Today's Date: 05/09/2024  END OF SESSION:  PT End of Session - 05/09/24 1024     Visit Number 8    Date for PT Re-Evaluation 05/12/24    Authorization Type HTA    Progress Note Due on Visit 10    PT Start Time 1015    PT Stop Time 1055    PT Time Calculation (min) 40 min    Activity Tolerance Patient tolerated treatment well    Behavior During Therapy WFL for tasks assessed/performed               Past Medical History:  Diagnosis Date   Allergy    Ankylosis, left knee    post TKA   Arthritis    BPH (benign prostatic hyperplasia)    Complication of anesthesia    per pt causes memory issues post op   ED (erectile dysfunction)    GAD (generalized anxiety disorder)    History of adenomatous polyp of colon    Hyperlipidemia    Hypertension    Malignant neoplasm prostate (HCC) 09/2021   urologist--- dr newsome/  radiation oncologist--- dr Lorri Rota;  dx 10/ 2022, Gleason, 3+4;   01-21-2022  radioactive prostate seed implants   Postural dizziness with presyncope 07/2021   cardiologist evalution by dr Emmette Harms;  tested results in epic ---- event monitor 07-24-2021  shows SR w/ rare PAC/ PVC;  echo 08-13-2021  ef 55-60%, GIDD, mild MR/ AR,, AA 3.9cm,  ETT 08-13-2017 no ischemia   Pre-diabetes    Psoriasis    Past Surgical History:  Procedure Laterality Date   APPENDECTOMY     age 32   COLONOSCOPY WITH PROPOFOL   09/30/2022   dr Feliberto Hopping   JOINT REPLACEMENT  01/07/2023   KNEE CLOSED REDUCTION Left 03/02/2023   Procedure: CLOSED MANIPULATION KNEE;  Surgeon: Murleen Arms, MD;  Location: [facility];  Service: Orthopedics;  Laterality: Left;   RADIOACTIVE SEED IMPLANT N/A 01/21/2022   Procedure: RADIOACTIVE SEED IMPLANT/BRACHYTHERAPY IMPLANT;  Surgeon: Sherlyn Ditto, MD;  Location: [facility];  Service: Urology;   Laterality: N/A;  90 MINS   SPACE OAR INSTILLATION N/A 01/21/2022   Procedure: SPACE OAR INSTILLATION;  Surgeon: Sherlyn Ditto, MD;  Location: [facility];  Service: Urology;  Laterality: N/A;   TOTAL KNEE ARTHROPLASTY Left 01/07/2023   @ [facility] by dr Pryor Browning   Patient Active Problem List   Diagnosis Date Noted   Prediabetes 02/11/2022   Overweight (BMI 25.0-29.9) 02/11/2022   Malignant neoplasm of prostate (HCC) 10/25/2021   Elevated PSA    BPH (benign prostatic hyperplasia) 12/25/2014   Erectile dysfunction 03/10/2014   Anxiety 12/06/2012   Psoriasis 12/06/2012   Hypertension    Hyperlipidemia     PCP: Copland, Skipper Dumas, MD   REFERRING PROVIDER: Kaylee Partridge, MD   REFERRING DIAG: C61 (ICD-10-CM) - Prostate cancer (HCC)  THERAPY DIAG:  Muscle weakness (generalized)  Unspecified lack of coordination  Abnormal posture  Rationale for Evaluation and Treatment: Rehabilitation  ONSET DATE: 2022  SUBJECTIVE:  SUBJECTIVE STATEMENT: Pt states that he did very well with bladder control at graduation and was able to use urge drill to successfully control. He feels like what we have been doing has worked. He reports feeling confident in discharge today, but would like to review his exercises.  PAIN:  Are you having pain? No   PRECAUTIONS: None  RED FLAGS: None   WEIGHT BEARING RESTRICTIONS: No  FALLS:  Has patient fallen in last 6 months? 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  ACTIVITY LEVEL : plays the bag pipe, but no other exercise   PLOF: Independent  PATIENT GOALS: better bladder control   PERTINENT HISTORY:  Lt TKA, Lt closed manipulation of Lt knee, appendectomy, benign prostatic hyperplasia, erectile dysfunction, anxiety,  malignant neoplasm of prostate treated with radioactive seed implantation 2022 (remission), psoriasis  BOWEL MOVEMENT: Pain with bowel movement: No Type of bowel movement:Frequency 1x/day and Strain no Fully empty rectum: Yes:   Leakage: No unless he is sick Pads: No Fiber supplement/laxative No  URINATION: Pain with urination: No Fully empty bladder: No Stream: Weak Urgency: Yes  Frequency: couple times a day;  Leakage: Urge to void and Walking to the bathroom, walking down stairs Pads: No, keeps tissue in his pocket  INTERCOURSE: Pain with intercourse: none Erection: he will initially get erection with medication, but then cannot maintain  Ejaculation: difficult to find the right stimulation for orgasm    OBJECTIVE:  Note: Objective measures were completed at Evaluation unless otherwise noted. 05/09/24: PFIQ-7: 19  02/18/24: PATIENT SURVEYS:   PFIQ-7: 57  COGNITION: Overall cognitive status: Within functional limits for tasks assessed     SENSATION: Light touch: Appears intact  FUNCTIONAL TESTS:  Squat: Rt weight shift, large Rt pelvic drop in squat, bil LE ER Single leg stance:  Rt: Lt pelvic drop  Lt: Compensated trendelenburg Curl-up test: 3 finger width distortion    GAIT: Assistive device utilized: None Comments: slow gait, bil LE ER  POSTURE: rounded shoulders, forward head, decreased lumbar lordosis, increased thoracic kyphosis, and posterior pelvic tilt   LUMBARAROM/PROM:  A/PROM A/PROM  Eval (% available)  Flexion 100  Extension 50  Right lateral flexion 100  Left lateral flexion 100  Right rotation 100  Left rotation 100   (Blank rows = not tested)   PALPATION:  Abdominal: apical breathing pattern, decreased rib cage mobility on Lt                External Perineal Exam: redness and irritation - tissue looks thin                              Internal Pelvic Floor: some tenderness   Patient confirms identification and approves PT to  assess internal pelvic floor and treatment Yes  PELVIC MMT:   MMT eval  Internal Anal Sphincter 2/5  External Anal Sphincter 2/5  Puborectalis 2/5  Diastasis Recti 2 finger width separation above umbilicus  (Blank rows = not tested)        TONE: Low   TODAY'S TREATMENT:  DATE:  05/09/24 Neuromuscular re-education: Bridge with hip adduction, transversus abdominus, and pelvic floor muscle 2 x 10 Seated hip adduction ball press with transversus abdominus and pelvic floor muscle 2 x 10 Seated hip abduction red band with transversus abdominus and pelvic floor muscle 2 x 10 Seated resisted march red band with transversus abdominus and pelvic floor muscle 2 x 10 Pelvic floor muscle contraction review Long holds Quick flicks  Therapeutic activities: Squats to table 2 x 10 Pallof press 10x bil green band Bil standing shoulder extensions 10x green band 3 way kick 10x each, bil   05/03/24 Neuromuscular re-education: BOSU step overs 2 x 10 Airex 3-way kick 10x each, bil Airex heel raises 3 x 10 Therapeutic activities: Review of urge drill Review of voiding schedule 2-3 hours Heel raises for urgency Backward walk outs 3lbs bil 10x Rows 7lbs bil 3 x 10   04/27/24 Neuromuscular re-education: Airex beam walking and backward 8 laps 3 way kick standing on airex 10x each, bil Therapeutic activities: Squatty potty Relaxed toilet mechanics Exhale with pushing/lengthening pelvic floor - balloon Sitting or leaning on wall to urinate Nu-step level 4 6 minutes Forward walk outs 3 lbs bil 6x Backward walk outs 3 lbs bil 6x Chop 3lbs 12x bil   PATIENT EDUCATION:  Education details: See above Person educated: Patient Education method: Explanation, Demonstration, Tactile cues, Verbal cues, and Handouts Education comprehension: verbalized understanding  HOME  EXERCISE PROGRAM: [id number]  ASSESSMENT:  CLINICAL IMPRESSION: Pt doing very well overall with bladder control. He is having much less urgency and leaking. He is working on altered toilet mechanics to help more complete bladder emptying. He did well with review of HEP having good understanding of breathing for appropriate pressure management and pelvic floor muscle contraction. Due to having met personal and rehab goals for pelvic floor physical therapy, he is prepared to D/C at this time; he was encouraged to call with any questions/concerns.   OBJECTIVE IMPAIRMENTS: decreased activity tolerance, decreased coordination, decreased endurance, decreased mobility, decreased strength, increased fascial restrictions, increased muscle spasms, impaired tone, postural dysfunction, and pain.   ACTIVITY LIMITATIONS: continence  PARTICIPATION LIMITATIONS: cleaning, interpersonal relationship, community activity, and occupation, and exercise  PERSONAL FACTORS: 3+ comorbidities: medical history are also affecting patient's functional outcome.   REHAB POTENTIAL: Good  CLINICAL DECISION MAKING: Evolving/moderate complexity  EVALUATION COMPLEXITY: Moderate   GOALS: Goals reviewed with patient? Yes  SHORT TERM GOALS: Target date: 03/17/2024 - updated 05/09/24   Pt will be independent with HEP.   Baseline: Goal status: MET 04/04/24  2.  Pt will be able to teach back and utilize urge suppression technique in order to help reduce number of trips to the bathroom.    Baseline:  Goal status: MET 04/04/24  3.  Pt will be independent with use of double voiding in order to help complete bladder emptying and avoid post-void dribbling.  Baseline:  Goal status: IN PROGRESS 04/04/24  4.  Pt will report 25% improvement in urinary incontinence.  Baseline:  Goal status: MET 04/04/24   LONG TERM GOALS: Target date: 05/12/24 - updated 05/09/24  Pt will be independent with advanced HEP.   Baseline:  Goal  status:  MET 05/09/24  2.  Pt will report 75% improvement in urinary incontinence.  Baseline:  Goal status: MET 05/09/24  3.  Pt will increase pelvic floor muscle strength to at least 3/5.  Baseline:  Goal status:  DISCHARGED 05/09/24  4.  Pt will decrease PFIQ-7 score to <30  in order to demonstrate functional improvement.  Baseline:  Goal status: MET 05/09/24  5.  Pt will be able to teach back and utilize urge suppression technique in order to help reduce number of trips to the bathroom.    Baseline:  Goal status: MET 05/09/24    PLAN:  PT FREQUENCY: -  PT DURATION: -  PLANNED INTERVENTIONS: -  PLAN FOR NEXT SESSION: D/C  PHYSICAL THERAPY DISCHARGE SUMMARY  Visits from Start of Care: 8  Current functional level related to goals / functional outcomes: Independent   Remaining deficits: See above   Education / Equipment: HEP   Patient agrees to discharge. Patient goals were partially met. Patient is being discharged due to being pleased with the current functional level.    Verlena Glenn, PT, DPT05/19/2510:54 AM

## 2024-05-30 ENCOUNTER — Ambulatory Visit (INDEPENDENT_AMBULATORY_CARE_PROVIDER_SITE_OTHER): Admitting: *Deleted

## 2024-05-30 VITALS — Ht 66.5 in | Wt 184.0 lb

## 2024-05-30 DIAGNOSIS — Z0000 Encounter for general adult medical examination without abnormal findings: Secondary | ICD-10-CM

## 2024-05-30 NOTE — Progress Notes (Signed)
 Subjective:   John Harewood. is a 73 y.o. who presents for a Medicare Wellness preventive visit.  As a reminder, Annual Wellness Visits don't include a physical exam, and some assessments may be limited, especially if this visit is performed virtually. We may recommend an in-person follow-up visit with your provider if needed.  Visit Complete: Virtual I connected with  John Wagner. on 05/30/24 by a audio enabled telemedicine application and verified that I am speaking with the correct person using two identifiers.  Patient Location: Home  Provider Location: Office/Clinic  I discussed the limitations of evaluation and management by telemedicine. The patient expressed understanding and agreed to proceed.  Vital Signs: Because this visit was a virtual/telehealth visit, some criteria may be missing or patient reported. Any vitals not documented were not able to be obtained and vitals that have been documented are patient reported.  VideoDeclined- This patient declined Librarian, academic. Therefore the visit was completed with audio only.  Persons Participating in Visit: Patient.  AWV Questionnaire: Yes:  Patient AWV questionnaire was completed on 05/30/24.  Cardiac Risk Factors include: advanced age (>65men, >75 women);dyslipidemia;hypertension;male gender     Objective:     Today's Vitals   05/30/24 0903  Weight: 184 lb (83.5 kg)  Height: 5' 6.5" (1.689 m)   Body mass index is 29.25 kg/m.     05/30/2024    9:28 AM 02/18/2024    9:03 AM 05/28/2023    9:43 AM 03/02/2023    6:10 AM 02/12/2022   11:00 AM 01/21/2022   12:11 PM 11/07/2021    9:43 AM  Advanced Directives  Does Patient Have a Medical Advance Directive? Yes Yes Yes Yes Yes Yes No  Type of Advance Directive Living will Healthcare Power of [place];Living will Healthcare Power of [place];Living will Healthcare Power of [place];Living will Living will;Healthcare Power of Attorney Living  will   Does patient want to make changes to medical advance directive? No - Patient declined No - Patient declined No - Patient declined   No - Patient declined   Copy of Healthcare Power of Attorney in Chart?  No - copy requested No - copy requested No - copy requested     Would patient like information on creating a medical advance directive?       Yes (ED - Information included in AVS)    Current Medications (verified) Outpatient Encounter Medications as of 05/30/2024  Medication Sig   acyclovir  (ZOVIRAX ) 400 MG tablet TAKE ONE TABLET BY MOUTH THREE TIMES A DAY AS NEEDED FOR OUTBREAKS   ALPRAZolam  (XANAX ) 1 MG tablet TAKE 1/2 TABLET AT NOON, THEN TAKE 1/2 TABLET BY MOUTH EVERY NIGHT AT BEDTIME MAY TAKE WHOLE TABLET IF NEEDED ON OCCASION   betamethasone valerate lotion (VALISONE) 0.1 % Apply 1 application  topically 2 (two) times daily as needed.   hydrOXYzine  (ATARAX ) 25 MG tablet TAKE ONE TABLET BY MOUTH EVERY 8 HOURS AS NEEDED FOR ITCHING   lisinopril  (ZESTRIL ) 5 MG tablet Take 1 tablet (5 mg total) by mouth daily.   propranolol  (INDERAL ) 10 MG tablet TAKE 1 TABLET BY MOUTH TWO TIMES A DAY AS NEEDED FOR PERFORMANCE ANXIETY   REPATHA  SURECLICK 140 MG/ML SOAJ INJECT 140 MGS UNDER THE SKIN EVERY 14 DAYS   tadalafil  (CIALIS ) 20 MG tablet Take 1 tablet (20 mg total) by mouth daily as needed for erectile dysfunction.   tamsulosin  (FLOMAX ) 0.4 MG CAPS capsule Take 1 capsule (0.4 mg total) by mouth daily  after supper. (Patient taking differently: Take 0.4 mg by mouth daily after supper.)   traMADol  (ULTRAM ) 50 MG tablet TAKE 1 TABLET BY MOUTH EVERY 8 HOURS AS NEEDED   Trolamine Salicylate (ASPERCREME EX) Apply topically as needed.   predniSONE  (DELTASONE ) 20 MG tablet Take 40 mg by mouth daily for 3 days (Patient not taking: Reported on 05/30/2024)   [DISCONTINUED] cefdinir  (OMNICEF ) 300 MG capsule Take 1 capsule (300 mg total) by mouth 2 (two) times daily. (Patient not taking: Reported on 05/30/2024)    No facility-administered encounter medications on file as of 05/30/2024.    Allergies (verified) Buspar [buspirone], Mobic [meloxicam], and Statins   History: Past Medical History:  Diagnosis Date   Allergy    Ankylosis, left knee    post TKA   Arthritis    BPH (benign prostatic hyperplasia)    Complication of anesthesia    per pt causes memory issues post op   ED (erectile dysfunction)    GAD (generalized anxiety disorder)    History of adenomatous polyp of colon    Hyperlipidemia    Hypertension    Malignant neoplasm prostate (HCC) 09/2021   urologist--- dr newsome/  radiation oncologist--- dr Lorri Rota;  dx 10/ 2022, Gleason, 3+4;   01-21-2022  radioactive prostate seed implants   Postural dizziness with presyncope 07/2021   cardiologist evalution by dr Emmette Harms;  tested results in epic ---- event monitor 07-24-2021  shows SR w/ rare PAC/ PVC;  echo 08-13-2021  ef 55-60%, GIDD, mild MR/ AR,, AA 3.9cm,  ETT 08-13-2017 no ischemia   Pre-diabetes    Psoriasis    Past Surgical History:  Procedure Laterality Date   APPENDECTOMY     age 73   COLONOSCOPY WITH PROPOFOL   09/30/2022   dr Feliberto Hopping   JOINT REPLACEMENT  01/07/2023   KNEE CLOSED REDUCTION Left 03/02/2023   Procedure: CLOSED MANIPULATION KNEE;  Surgeon: Murleen Arms, MD;  Location: [facility];  Service: Orthopedics;  Laterality: Left;   RADIOACTIVE SEED IMPLANT N/A 01/21/2022   Procedure: RADIOACTIVE SEED IMPLANT/BRACHYTHERAPY IMPLANT;  Surgeon: Sherlyn Ditto, MD;  Location: [facility];  Service: Urology;  Laterality: N/A;  90 MINS   SPACE OAR INSTILLATION N/A 01/21/2022   Procedure: SPACE OAR INSTILLATION;  Surgeon: Sherlyn Ditto, MD;  Location: [facility];  Service: Urology;  Laterality: N/A;   TOTAL KNEE ARTHROPLASTY Left 01/07/2023   @ [facility] by dr Pryor Browning   Family History  Problem Relation Age of Onset   Cancer Father        prostate cancer    Hypertension Mother    Heart disease Mother    Alcohol abuse Mother    Social History   Socioeconomic History   Marital status: Single    Spouse name: Not on file   Number of children: Not on file   Years of education: Not on file   Highest education level: Bachelor's degree (e.g., BA, AB, BS)  Occupational History   Occupation: picture frame/artist    Employer: [employer]  Tobacco Use   Smoking status: Former   Smokeless tobacco: Never   Tobacco comments:    yuck!  Substance and Sexual Activity   Alcohol use: Yes    Alcohol/week: 5.0 standard drinks of alcohol    Types: 3 Glasses of wine, 2 Standard drinks or equivalent per week   Drug use: No   Sexual activity: Yes    Birth control/protection: Condom  Other Topics Concern  Not on file  Social History Narrative   Not on file   Social Drivers of Health   Financial Resource Strain: Low Risk  (05/30/2024)   Overall Financial Resource Strain (CARDIA)    Difficulty of Paying Living Expenses: Not very hard  Food Insecurity: No Food Insecurity (05/30/2024)   Hunger Vital Sign    Worried About Running Out of Food in the Last Year: Never true    Ran Out of Food in the Last Year: Never true  Transportation Needs: No Transportation Needs (05/30/2024)   PRAPARE - Administrator, Civil Service (Medical): No    Lack of Transportation (Non-Medical): No  Physical Activity: Insufficiently Active (05/30/2024)   Exercise Vital Sign    Days of Exercise per Week: 4 days    Minutes of Exercise per Session: 30 min  Stress: No Stress Concern Present (05/30/2024)   [employer] of Occupational Health - Occupational Stress Questionnaire    Feeling of Stress : Not at all  Social Connections: Moderately Integrated (05/30/2024)   Social Connection and Isolation Panel [NHANES]    Frequency of Communication with Friends and Family: More than three times a week    Frequency of Social Gatherings with Friends and Family: Three times a  week    Attends Religious Services: More than 4 times per year    Active Member of Clubs or Organizations: Yes    Attends Banker Meetings: 1 to 4 times per year    Marital Status: Divorced    Tobacco Counseling Counseling given: Not Answered Tobacco comments: yuck!    Clinical Intake:  Pre-visit preparation completed: Yes  Pain : No/denies pain     BMI - recorded: 29.25 Nutritional Risks: None Diabetes: No  Lab Results  Component Value Date   HGBA1C 6.2 01/13/2024   HGBA1C 6.2 07/13/2023   HGBA1C 6.1 01/05/2023     How often do you need to have someone help you when you read instructions, pamphlets, or other written materials from your doctor or pharmacy?: 1 - Never What is the last grade level you completed in school?: bachelor's degree  Interpreter Needed?: No  Information entered by :: Susa Engman, CMA   Activities of Daily Living     05/30/2024    9:32 AM 05/30/2024    9:06 AM  In your present state of health, do you have any difficulty performing the following activities:  Hearing? 0 0  Vision? 0 0  Difficulty concentrating or making decisions? 0 0  Walking or climbing stairs? 0 0  Dressing or bathing? 0 0  Doing errands, shopping? 0 0  Preparing Food and eating ? N N  Using the Toilet? N N  In the past six months, have you accidently leaked urine? Y N  Comment leaks after urinating. Has done PT / exercises   Do you have problems with loss of bowel control? N N  Managing your Medications? N N  Managing your Finances? N N  Housekeeping or managing your Housekeeping? N N    Patient Care Team: Copland, Skipper Dumas, MD as PCP - General (Family Medicine) Tobb, Kardie, DO as PCP - Cardiology (Cardiology) Ottelin, Mark, MD (Inactive) as Consulting Physician (Urology) Katheleen Palmer, RN as Oncology Nurse Navigator Annitta Kindler, Rudine Cos, MD (Inactive) as Consulting Physician (Urology) Kenith Payer, MD as Consulting Physician (Radiation  Oncology) Debbie Fails, Laura Polio, NP as Nurse Practitioner (Hematology and Oncology) Dove, Natro D, RN as Registered Nurse  I have updated your Care  Teams any recent Medical Services you may have received from other providers in the past year.     Assessment:    This is a routine wellness examination for Aryav.  Hearing/Vision screen Hearing Screening - Comments:: Pt notes slight decrease of hearing in upper ranges. 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 Vision Screening - Comments:: Denies difficulty, wears reading glasses.   Goals Addressed             This Visit's Progress    Patient Stated   On track    Would like to lose some weight       Depression Screen     05/30/2024    9:20 AM 01/13/2024   11:26 AM 05/28/2023    9:51 AM 01/05/2023   11:10 AM 01/13/2022    9:34 AM 08/29/2021    9:24 AM 07/10/2021    1:53 PM  PHQ 2/9 Scores  PHQ - 2 Score 0 0 0 1 1 1  0  PHQ- 9 Score  0  2 5  0    Fall Risk     05/30/2024    9:06 AM 05/28/2023    9:17 AM 01/05/2023   11:10 AM 08/29/2021    9:22 AM 07/10/2021    1:53 PM  Fall Risk   Falls in the past year? 1 0 0 0 0  Number falls in past yr: 0 0 0 0 0  Injury with Fall? 0 0 0 0 0  Risk for fall due to :  No Fall Risks No Fall Risks    Follow up  Falls evaluation completed Falls evaluation completed Falls prevention discussed     MEDICARE RISK AT HOME:  Medicare Risk at Home Any stairs in or around the home?: (Patient-Rptd) Yes If so, are there any without handrails?: (Patient-Rptd) No Home free of loose throw rugs in walkways, pet beds, electrical cords, etc?: (Patient-Rptd) Yes Adequate lighting in your home to reduce risk of falls?: (Patient-Rptd) Yes Life alert?: (Patient-Rptd) No Use of a cane, walker or w/c?: (Patient-Rptd) No Grab bars in the bathroom?: (Patient-Rptd) No Shower chair or bench in shower?: (Patient-Rptd) No Elevated toilet seat or a handicapped toilet?: (Patient-Rptd)  No  TIMED UP AND GO:  Was the test performed?  No, audio  Cognitive Function: 6CIT completed        05/30/2024    9:35 AM 05/28/2023    9:58 AM  6CIT Screen  What Year? 0 points 0 points  What month?  0 points  What time? 0 points 0 points  Count back from 20 0 points 0 points  Months in reverse 0 points 0 points  Repeat phrase 0 points 0 points  Total Score  0 points    Immunizations Immunization History  Administered Date(s) Administered   Fluad Quad(high Dose 65+) 02/07/2021, 01/13/2022   Fluad Trivalent(High Dose 65+) 01/13/2024   Hepatitis B 08/04/1999, 09/04/1999, 02/20/2000   Influenza, High Dose Seasonal PF 02/09/2019   Influenza,inj,Quad PF,6+ Mos 01/26/2017, 02/03/2018, 01/30/2020   PFIZER(Purple Top)SARS-COV-2 Vaccination 02/17/2020, 03/13/2020, 12/18/2020   Pfizer Covid-19 Vaccine Bivalent Booster 46yrs & up 07/14/2022   Pneumococcal Conjugate-13 08/04/2018   Pneumococcal Polysaccharide-23 01/30/2020   Td 02/07/2021   Tdap 06/24/2010   Zoster, Live 07/05/2016    Screening Tests Health Maintenance  Topic Date Due   Zoster Vaccines- Shingrix (1 of 2) 02/24/1972   COVID-19 Vaccine (5 - 2024-25 season) 08/23/2023   Medicare Annual Wellness (AWV)  05/27/2024   INFLUENZA VACCINE  07/22/2024   Colonoscopy  09/30/2025   DTaP/Tdap/Td (3 - Td or Tdap) 02/07/2031   Pneumonia Vaccine 26+ Years old  Completed   Hepatitis C Screening  Completed   HPV VACCINES  Aged Out   Meningococcal B Vaccine  Aged Out   Fecal DNA (Cologuard)  Discontinued    Health Maintenance  Health Maintenance Due  Topic Date Due   Zoster Vaccines- Shingrix (1 of 2) 02/24/1972   COVID-19 Vaccine (5 - 2024-25 season) 08/23/2023   Medicare Annual Wellness (AWV)  05/27/2024   Health Maintenance Items Addressed: Pt to get shingrix at pharmacy. Will discuss future COVID vaccines with PCP.  Additional Screening:  Vision Screening: Recommended annual ophthalmology exams for early  detection of glaucoma and other disorders of the eye. Would you like a referral to an eye doctor? No    Dental Screening: Recommended annual dental exams for proper oral hygiene  Community Resource Referral / Chronic Care Management: CRR required this visit?  No   CCM required this visit?  No   Plan:    I have personally reviewed and noted the following in the patient's chart:   Medical and social history Use of alcohol, tobacco or illicit drugs  Current medications and supplements including opioid prescriptions. Patient is not currently taking opioid prescriptions. Functional ability and status Nutritional status Physical activity Advanced directives List of other physicians Hospitalizations, surgeries, and ER visits in previous 12 months Vitals Screenings to include cognitive, depression, and falls Referrals and appointments  In addition, I have reviewed and discussed with patient certain preventive protocols, quality metrics, and best practice recommendations. A written personalized care plan for preventive services as well as general preventive health recommendations were provided to patient.   Susa Engman, CMA   05/30/2024   After Visit Summary: (MyChart) Due to this being a telephonic visit, the after visit summary with patients personalized plan was offered to patient via MyChart   Notes: Nothing significant to report at this time.

## 2024-05-30 NOTE — Patient Instructions (Signed)
 John Wagner , Thank you for taking time out of your busy schedule to complete your Annual Wellness Visit with me. I enjoyed our conversation and look forward to speaking with you again next year. I, as well as your care team,  appreciate your ongoing commitment to your health goals. Please review the following plan we discussed and let me know if I can assist you in the future. Your Game plan/ To Do List    Follow up Visits: Next Medicare AWV with our clinical staff: 05/31/25 9am   Next Office Visit with your provider: 07/13/24  Clinician Recommendations:  Aim for 30 minutes of exercise or brisk walking, 6-8 glasses of water, and 5 servings of fruits and vegetables each day.       This is a list of the screening recommended for you and due dates:  Health Maintenance  Topic Date Due   Zoster (Shingles) Vaccine (1 of 2) 02/24/1972   COVID-19 Vaccine (5 - 2024-25 season) 08/23/2023   Flu Shot  07/22/2024   Medicare Annual Wellness Visit  05/30/2025   Colon Cancer Screening  09/30/2025   DTaP/Tdap/Td vaccine (3 - Td or Tdap) 02/07/2031   Pneumonia Vaccine  Completed   Hepatitis C Screening  Completed   HPV Vaccine  Aged Out   Meningitis B Vaccine  Aged Out   Cologuard (Stool DNA test)  Discontinued    Advanced directives: (Copy Requested) Please bring a copy of your health care power of attorney and living will to the office to be added to your chart at your convenience. You can mail to [facility] [address] [place], [place] [address] or email to [email or web address] Advance Care Planning is important because it:  [x]  Makes sure you receive the medical care that is consistent with your values, goals, and preferences  [x]  It provides guidance to your family and loved ones and reduces their decisional burden about whether or not they are making the right decisions based on your wishes.  Follow the link provided in your after visit summary or read over the  paperwork we have mailed to you to help you started getting your Advance Directives in place. If you need assistance in completing these, please reach out to us  so that we can help you!  See attachments for Preventive Care.

## 2024-06-05 ENCOUNTER — Other Ambulatory Visit: Payer: Self-pay | Admitting: Family Medicine

## 2024-06-05 DIAGNOSIS — G8929 Other chronic pain: Secondary | ICD-10-CM

## 2024-06-06 ENCOUNTER — Encounter: Payer: Self-pay | Admitting: Family Medicine

## 2024-06-06 DIAGNOSIS — N529 Male erectile dysfunction, unspecified: Secondary | ICD-10-CM

## 2024-06-06 MED ORDER — TADALAFIL 20 MG PO TABS: 20.0000 mg | ORAL_TABLET | Freq: Every day | ORAL | 2 refills | Status: DC | PRN

## 2024-06-21 ENCOUNTER — Other Ambulatory Visit: Payer: Self-pay | Admitting: Family Medicine

## 2024-06-21 DIAGNOSIS — F418 Other specified anxiety disorders: Secondary | ICD-10-CM

## 2024-06-21 DIAGNOSIS — L299 Pruritus, unspecified: Secondary | ICD-10-CM

## 2024-06-21 DIAGNOSIS — J209 Acute bronchitis, unspecified: Secondary | ICD-10-CM

## 2024-06-21 DIAGNOSIS — F411 Generalized anxiety disorder: Secondary | ICD-10-CM

## 2024-07-04 ENCOUNTER — Encounter: Payer: Self-pay | Admitting: Family Medicine

## 2024-07-09 NOTE — Progress Notes (Unsigned)
 [facility] [address], [address] [place], [place] [address] [phone]  Date:  07/13/2024   Name:  John Wagner.   DOB:  Apr 29, 1951   MRN:  [id number]  PCP:  John Harlene BROCKS, MD    Chief Complaint: No chief complaint on file.   History of Present Illness:  John Wagner. is a 73 y.o. very pleasant male patient who presents with the following:  Patient seen today for periodic follow-up.  Most recent visit with myself was in February History of prediabetes, BPH, hypertension, hyperlipidemia, anxiety treated with xanax , joint pain, prostate cancer diagnosed 2022 and treated with radioactive seed implantation    Prostate cancer is considered to be in remission, monitored by urology-most recent visit was in April-I will request this note Most recent labs on chart from January-A1c 6.2  Second dose of Shingrix  Acyclovir  as needed Alprazolam  as needed Lisinopril  5 Propranolol  as needed Repatha  Tamsulosin  Tadalafil  as needed Tramadol  as needed Patient Active Problem List   Diagnosis Date Noted   Prediabetes 02/11/2022   Overweight (BMI 25.0-29.9) 02/11/2022   Malignant neoplasm of prostate (HCC) 10/25/2021   Elevated PSA    BPH (benign prostatic hyperplasia) 12/25/2014   Erectile dysfunction 03/10/2014   Anxiety 12/06/2012   Psoriasis 12/06/2012   Hypertension    Hyperlipidemia     Past Medical History:  Diagnosis Date   Allergy    Ankylosis, left knee    post TKA   Arthritis    BPH (benign prostatic hyperplasia)    Complication of anesthesia    per pt causes memory issues post op   ED (erectile dysfunction)    GAD (generalized anxiety disorder)    History of adenomatous polyp of colon    Hyperlipidemia    Hypertension    Malignant neoplasm prostate (HCC) 09/2021   urologist--- dr newsome/  radiation oncologist--- dr patrcia;  dx 10/ 2022, Gleason, 3+4;   01-21-2022  radioactive prostate seed  implants   Postural dizziness with presyncope 07/2021   cardiologist evalution by dr sheena;  tested results in epic ---- event monitor 07-24-2021  shows SR w/ rare PAC/ PVC;  echo 08-13-2021  ef 55-60%, GIDD, mild MR/ AR,, AA 3.9cm,  ETT 08-13-2017 no ischemia   Pre-diabetes    Psoriasis     Past Surgical History:  Procedure Laterality Date   APPENDECTOMY     age 42   COLONOSCOPY WITH PROPOFOL   09/30/2022   dr saintclair   JOINT REPLACEMENT  01/07/2023   KNEE CLOSED REDUCTION Left 03/02/2023   Procedure: CLOSED MANIPULATION KNEE;  Surgeon: Edna Toribio LABOR, MD;  Location: [facility];  Service: Orthopedics;  Laterality: Left;   RADIOACTIVE SEED IMPLANT N/A 01/21/2022   Procedure: RADIOACTIVE SEED IMPLANT/BRACHYTHERAPY IMPLANT;  Surgeon: Rosalind Zachary NOVAK, MD;  Location: [facility];  Service: Urology;  Laterality: N/A;  90 MINS   SPACE OAR INSTILLATION N/A 01/21/2022   Procedure: SPACE OAR INSTILLATION;  Surgeon: Rosalind Zachary NOVAK, MD;  Location: [facility];  Service: Urology;  Laterality: N/A;   TOTAL KNEE ARTHROPLASTY Left 01/07/2023   @ [facility] by dr edna    Social History   Tobacco Use   Smoking status: Former   Smokeless tobacco: Never   Tobacco comments:    yuck!  Substance Use Topics   Alcohol use: Yes    Alcohol/week: 5.0 standard drinks of alcohol    Types: 3  Glasses of wine, 2 Standard drinks or equivalent per week   Drug use: No    Family History  Problem Relation Age of Onset   Cancer Father        prostate cancer   Hypertension Mother    Heart disease Mother    Alcohol abuse Mother     Allergies  Allergen Reactions   Buspar [Buspirone]     Pt stated does remember reaction but does remember it was allergic reaction   Mobic [Meloxicam] Nausea And Vomiting   Statins     myalgias    Medication list has been reviewed and updated.  Current Outpatient Medications on File Prior to Visit  Medication Sig  Dispense Refill   acyclovir  (ZOVIRAX ) 400 MG tablet TAKE ONE TABLET BY MOUTH THREE TIMES A DAY AS NEEDED FOR OUTBREAKS 45 tablet 3   ALPRAZolam  (XANAX ) 1 MG tablet TAKE 1/2 TABLET AT NOON, THEN TAKE 1/2 TABLET BY MOUTH EVERY NIGHT AT BEDTIME MAY TAKE WHOLE TABLET IF NEEDED ON OCCASION 60 tablet 3   betamethasone valerate lotion (VALISONE) 0.1 % Apply 1 application  topically 2 (two) times daily as needed.     hydrOXYzine  (ATARAX ) 25 MG tablet TAKE 1 TABLET BY MOUTH EVERY 8 HOURS AS NEEDED FOR ITCHING 45 tablet 0   lisinopril  (ZESTRIL ) 5 MG tablet Take 1 tablet (5 mg total) by mouth daily. 90 tablet 1   predniSONE  (DELTASONE ) 20 MG tablet Take 40 mg by mouth daily for 3 days (Patient not taking: Reported on 05/30/2024) 6 tablet 0   propranolol  (INDERAL ) 10 MG tablet TAKE 1 TABLET BY MOUTH 2 TIMES A DAY AS NEEDED FOR PERFORMANCE ANXIETY 30 tablet 3   REPATHA  SURECLICK 140 MG/ML SOAJ INJECT 140 MGS UNDER THE SKIN EVERY 14 DAYS 2 mL 11   tadalafil  (CIALIS ) 20 MG tablet Take 1 tablet (20 mg total) by mouth daily as needed for erectile dysfunction. 30 tablet 2   tamsulosin  (FLOMAX ) 0.4 MG CAPS capsule Take 1 capsule (0.4 mg total) by mouth daily after supper. (Patient taking differently: Take 0.4 mg by mouth daily after supper.) 30 capsule 2   traMADol  (ULTRAM ) 50 MG tablet TAKE 1 TABLET BY MOUTH EVERY 8 HOURS AS NEEDED 30 tablet 2   Trolamine Salicylate (ASPERCREME EX) Apply topically as needed.     No current facility-administered medications on file prior to visit.    Review of Systems:  As per HPI- otherwise negative.   Physical Examination: There were no vitals filed for this visit. There were no vitals filed for this visit. There is no height or weight on file to calculate BMI. Ideal Body Weight:    GEN: no acute distress. HEENT: Atraumatic, Normocephalic.  Ears and Nose: No external deformity. CV: RRR, No M/G/R. No JVD. No thrill. No extra heart sounds. PULM: CTA B, no wheezes,  crackles, rhonchi. No retractions. No resp. distress. No accessory muscle use. ABD: S, NT, ND, +BS. No rebound. No HSM. EXTR: No c/c/e PSYCH: Normally interactive. Conversant.    Assessment and Plan: ***  Signed Harlene Schroeder, MD

## 2024-07-09 NOTE — Patient Instructions (Incomplete)
It was great to see you again today!  

## 2024-07-13 ENCOUNTER — Encounter: Payer: Self-pay | Admitting: Family Medicine

## 2024-07-13 ENCOUNTER — Ambulatory Visit (INDEPENDENT_AMBULATORY_CARE_PROVIDER_SITE_OTHER): Payer: PPO | Admitting: Family Medicine

## 2024-07-13 VITALS — BP 142/94 | HR 60 | Ht 66.5 in | Wt 184.0 lb

## 2024-07-13 DIAGNOSIS — R5382 Chronic fatigue, unspecified: Secondary | ICD-10-CM

## 2024-07-13 DIAGNOSIS — R0789 Other chest pain: Secondary | ICD-10-CM | POA: Diagnosis not present

## 2024-07-13 DIAGNOSIS — R0683 Snoring: Secondary | ICD-10-CM | POA: Diagnosis not present

## 2024-07-13 LAB — BASIC METABOLIC PANEL WITH GFR
BUN: 16 mg/dL (ref 6–23)
CO2: 30 meq/L (ref 19–32)
Calcium: 9.4 mg/dL (ref 8.4–10.5)
Chloride: 103 meq/L (ref 96–112)
Creatinine, Ser: 1.04 mg/dL (ref 0.40–1.50)
GFR: 72.3 mL/min (ref >60.00)
Glucose, Bld: 102 mg/dL — ABNORMAL HIGH (ref 70–99)
Potassium: 4.5 meq/L (ref 3.5–5.1)
Sodium: 139 meq/L (ref 135–145)

## 2024-07-13 LAB — TROPONIN I (HIGH SENSITIVITY): High Sens Troponin I: 5 ng/L (ref 2–17)

## 2024-07-13 LAB — CBC
HCT: 43.9 % (ref 39.0–52.0)
Hemoglobin: 14.9 g/dL (ref 13.0–17.0)
MCHC: 33.9 g/dL (ref 30.0–36.0)
MCV: 86.1 fl (ref 78.0–100.0)
Platelets: 347 K/uL (ref 150.0–400.0)
RBC: 5.1 Mil/uL (ref 4.22–5.81)
RDW: 13.9 % (ref 11.5–15.5)
WBC: 5.2 K/uL (ref 4.0–10.5)

## 2024-07-13 LAB — VITAMIN B12: Vitamin B-12: 420 pg/mL (ref 211–911)

## 2024-07-13 LAB — HEMOGLOBIN A1C: Hgb A1c MFr Bld: 6.3 % (ref 4.6–6.5)

## 2024-07-13 LAB — TSH: TSH: 1.97 u[IU]/mL (ref 0.35–5.50)

## 2024-07-13 MED ORDER — OMEPRAZOLE 40 MG PO CPDR: 40.0000 mg | DELAYED_RELEASE_CAPSULE | Freq: Every day | ORAL | 3 refills | Status: DC

## 2024-08-03 ENCOUNTER — Encounter: Payer: Self-pay | Admitting: Family Medicine

## 2024-08-06 NOTE — Progress Notes (Unsigned)
 Cardiology Office Note   Date:  08/08/2024  ID:  [name], DOB 1951/11/20, MRN [id number] PCP: Watt Harlene BROCKS, MD  [facility] Providers Cardiologist:  Dub Huntsman, DO   History of Present Illness John Lipsky. is a 73 y.o. male with a past medical history of hypertension, prediabetes, hyperlipidemia, anxiety, and BPH here for follow-up appointment.  Patient was first seen by our practice August 08, 2021 and at that time presented because of a presyncopal event.  During the visit ZIO monitor and carotid Dopplers were reviewed which were done by his primary care.  Echo was ordered.  Talked about hyperlipidemia and a PCSK9 inhibitor was recommended.  He was last seen February 2023 and at that time was seen in our lipid clinic and started on a PCSK9 inhibitor and continue to take that medication.  Had a radioactive seed implantation in for elevated PCA with prostate cancer.  Recovered from a questionable virus versus flu, potentially COVID.  No other complaints at that time.  Today, he presents with chest burning and a new right bundle branch block on EKG. He was referred by his primary care physician for evaluation of a new right bundle branch block on EKG.  He experienced chest burning, initially managed with omeprazole , which has since subsided. A new right bundle branch block was identified on EKG. Blood pressure readings have varied, with recent measurements of 140/80 mmHg and 142/94 mmHg. He does not regularly monitor blood pressure at home. He uses alprazolam  and beta blockers for performance anxiety, which initially caused arm numbness. He recalls an episode of low blood pressure, with readings of 90/67 mmHg, managed by increasing salt intake. He is on Repatha  for cholesterol management, improving LDL levels to 48 mg/dL, after experiencing pain with statins.  Reports no shortness of breath nor dyspnea on exertion. No edema, orthopnea, PND. Reports no palpitations.    Discussed the use of AI scribe software for clinical note transcription with the patient, who gave verbal consent to proceed.   ROS: Pertinent ROS in HPI  Studies Reviewed      TTE 07/2021 IMPRESSIONS   1. Left ventricular ejection fraction, by estimation, is 55 to 60%. The left ventricle has normal function. The left ventricle has no regional wall motion abnormalities. Left ventricular diastolic parameters are consistent with Grade I diastolic  dysfunction (impaired relaxation).   2. Right ventricular systolic function is normal. The right ventricular size is normal.   3. The mitral valve is normal in structure. Mild mitral valve regurgitation. No evidence of mitral stenosis.   4. The aortic valve is normal in structure. Aortic valve regurgitation is mild. No aortic stenosis is present.   5. Aneurysm of the ascending aorta, measuring 39 mm.   6. The inferior vena cava is normal in size with greater than 50%  respiratory variability, suggesting right atrial pressure of 3 mmHg.   FINDINGS   Left Ventricle: Left ventricular ejection fraction, by estimation, is 55  to 60%. The left ventricle has normal function. The left ventricle has no  regional wall motion abnormalities. The left ventricular internal cavity  size was normal in size. There is   borderline left ventricular hypertrophy. Left ventricular diastolic  parameters are consistent with Grade I diastolic dysfunction (impaired  relaxation).   Right Ventricle: The right ventricular size is normal. No increase in  right ventricular wall thickness. Right ventricular systolic function is  normal.   Left Atrium: Left atrial size was normal in size.  Right Atrium: Right atrial size was normal in size.   Pericardium: There is no evidence of pericardial effusion.   Mitral Valve: The mitral valve is normal in structure. Mild mitral valve  regurgitation. No evidence of mitral valve stenosis.   Tricuspid Valve: The tricuspid  valve is normal in structure. Tricuspid  valve regurgitation is mild . No evidence of tricuspid stenosis.   Aortic Valve: The aortic valve is normal in structure. Aortic valve  regurgitation is mild. Aortic regurgitation PHT measures 564 msec. No  aortic stenosis is present. Aortic valve mean gradient measures 3.0 mmHg.  Aortic valve peak gradient measures 6.1  mmHg. Aortic valve area, by VTI measures 2.71 cm.   Pulmonic Valve: The pulmonic valve was normal in structure. Pulmonic valve  regurgitation is not visualized. No evidence of pulmonic stenosis.   Aorta: The aortic root is normal in size and structure. There is an  aneurysm involving the ascending aorta measuring 39 mm.   Venous: The inferior vena cava is normal in size with greater than 50%  respiratory variability, suggesting right atrial pressure of 3 mmHg.   IAS/Shunts: No atrial level shunt detected by color flow Doppler.    Physical Exam VS:  BP (!) 140/86 (BP Location: Left Arm, Patient Position: Sitting, Cuff Size: Normal)   Pulse 80   Ht 5' 6 (1.676 m)   Wt 185 lb (83.9 kg)   SpO2 95%   BMI 29.86 kg/m        Wt Readings from Last 3 Encounters:  08/08/24 185 lb (83.9 kg)  07/13/24 184 lb (83.5 kg)  05/30/24 184 lb (83.5 kg)    GEN: Well nourished, well developed in no acute distress NECK: No JVD; No carotid bruits CARDIAC: RRR, no murmurs, rubs, gallops RESPIRATORY:  Clear to auscultation without rales, wheezing or rhonchi  ABDOMEN: Soft, non-tender, non-distended EXTREMITIES:  No edema; No deformity   ASSESSMENT AND PLAN  Right bundle branch block New right bundle branch block on EKG, possibly ischemic or benign. - Order exercise stress test to evaluate for ischemia. - Review stress test results for further intervention.  Hypertension Blood pressure elevated at 140/86 mmHg, possible white coat syndrome. - Recommend obtaining a home blood pressure monitor for spot checks. - Consider increasing  lisinopril  to 10 mg if blood pressure remains elevated.  Hyperlipidemia on PCSK9 inhibitor therapy Hyperlipidemia controlled with Repatha , LDL at 48 mg/dL, statin intolerance noted. - Continue Repatha  therapy. - Monitor for insurance or cost changes.   Prediabetes -continue to monitor with PCP     Dispo: Please return in 6 weeks with me or Dr. Sheena  Signed, Orren LOISE Fabry, PA-C

## 2024-08-08 ENCOUNTER — Ambulatory Visit: Attending: Physician Assistant | Admitting: Physician Assistant

## 2024-08-08 VITALS — BP 140/86 | HR 80 | Ht 66.0 in | Wt 185.0 lb

## 2024-08-08 DIAGNOSIS — E782 Mixed hyperlipidemia: Secondary | ICD-10-CM

## 2024-08-08 DIAGNOSIS — R7303 Prediabetes: Secondary | ICD-10-CM

## 2024-08-08 DIAGNOSIS — I10 Essential (primary) hypertension: Secondary | ICD-10-CM

## 2024-08-08 DIAGNOSIS — I454 Nonspecific intraventricular block: Secondary | ICD-10-CM | POA: Diagnosis not present

## 2024-08-08 NOTE — Patient Instructions (Signed)
 Medication Instructions:  Your physician recommends that you continue on your current medications as directed. Please refer to the Current Medication list given to you today.  *If you need a refill on your cardiac medications before your next appointment, please call your pharmacy*  Lab Work: NONE If you have labs (blood work) drawn today and your tests are completely normal, you will receive your results only by: MyChart Message (if you have MyChart) OR A paper copy in the mail If you have any lab test that is abnormal or we need to change your treatment, we will call you to review the results.  Testing/Procedures: Your physician has requested that you have an exercise tolerance test. For further information please visit [email or web address] Please also follow instruction sheet, as given.  Follow-Up: At [facility], you and your health needs are our priority.  As part of our continuing mission to provide you with exceptional heart care, our providers are all part of one team.  This team includes your primary Cardiologist (physician) and Advanced Practice Providers or APPs (Physician Assistants and Nurse Practitioners) who all work together to provide you with the care you need, when you need it.  Your next appointment:   6 week(s)  Provider:   Kardie Tobb, DO or Orren Fabry, PA-C  We recommend signing up for the patient portal called MyChart.  Sign up information is provided on this After Visit Summary.  MyChart is used to connect with patients for Virtual Visits (Telemedicine).  Patients are able to view lab/test results, encounter notes, upcoming appointments, etc.  Non-urgent messages can be sent to your provider as well.   To learn more about what you can do with MyChart, go to [email or web address].   Other Instructions Please check your blood pressure 1-2 times per day for 2 weeks and if they become elevated please let Orren Fabry, PA-C know.

## 2024-08-08 NOTE — Addendum Note (Signed)
 Addended by: LUCIEN ORREN SAILOR on: 08/08/2024 11:49 AM   Modules accepted: Orders

## 2024-08-11 ENCOUNTER — Encounter: Payer: Self-pay | Admitting: Neurology

## 2024-08-11 ENCOUNTER — Ambulatory Visit: Admitting: Neurology

## 2024-08-11 VITALS — BP 143/89 | HR 66 | Ht 66.0 in | Wt 186.0 lb

## 2024-08-11 DIAGNOSIS — Z820 Family history of epilepsy and other diseases of the nervous system: Secondary | ICD-10-CM | POA: Diagnosis not present

## 2024-08-11 DIAGNOSIS — G4719 Other hypersomnia: Secondary | ICD-10-CM | POA: Diagnosis not present

## 2024-08-11 DIAGNOSIS — E66811 Obesity, class 1: Secondary | ICD-10-CM | POA: Diagnosis not present

## 2024-08-11 DIAGNOSIS — Z9189 Other specified personal risk factors, not elsewhere classified: Secondary | ICD-10-CM

## 2024-08-11 DIAGNOSIS — R0683 Snoring: Secondary | ICD-10-CM

## 2024-08-11 DIAGNOSIS — R351 Nocturia: Secondary | ICD-10-CM | POA: Diagnosis not present

## 2024-08-11 NOTE — Progress Notes (Signed)
 Subjective:    Patient ID: John Wagner. is a 73 y.o. male.  HPI    True Mar, MD, PhD [facility] [address] [address] [place], [place] [address]  Dear Harlene,   I saw your patient, John Bogden., upon your kind request in my sleep clinic today for initial consultation of his sleep disorder, in particular, concern for underlying obstructive sleep apnea.  The patient is unaccompanied today.  As you know, John Wagner is a 73 year old male with an underlying medical history of allergies, hypertension, hyperlipidemia, prostate cancer, prediabetes, psoriasis, arthritis, status post left total knee arthroplasty, BPH, anxiety, and mild obesity, who reports snoring and excessive daytime somnolence.  He has had daytime fatigue for years, essentially since his 30s.  He used to take a daily nap after lunch but no longer takes a nap daily.  His Epworth sleepiness score is 8 out of 24, fatigue severity score is 25 out of 63.  He is single, he lives with his significant other about half the time.  He snores loudly and they often sleep in separate bedrooms. He reports a family history of sleep apnea affecting 1 brother. He drinks caffeine in the form of coffee, about 1-1/2 cups daily in the mornings.  He drinks alcohol nearly daily, about 2 glasses of wine at a time.  He is a non-smoker.  They have no pets.  He denies recurrent nocturnal or morning headaches.  He has nocturia about once on average, most nights.  Bedtime is generally around 10 and rise time around 7 or 8.  He has tried an over-the-counter oral appliance and then also a dentist made oral appliance and both were too uncomfortable and he does not use these.  His Past Medical History Is Significant For: Past Medical History:  Diagnosis Date   Allergy    Ankylosis, left knee    post TKA   Arthritis    BPH (benign prostatic hyperplasia)    Complication of anesthesia    per pt causes memory issues  post op   ED (erectile dysfunction)    GAD (generalized anxiety disorder)    History of adenomatous polyp of colon    Hyperlipidemia    Hypertension    Malignant neoplasm prostate (HCC) 09/2021   urologist--- dr newsome/  radiation oncologist--- dr patrcia;  dx 10/ 2022, Gleason, 3+4;   01-21-2022  radioactive prostate seed implants   Postural dizziness with presyncope 07/2021   cardiologist evalution by dr sheena;  tested results in epic ---- event monitor 07-24-2021  shows SR w/ rare PAC/ PVC;  echo 08-13-2021  ef 55-60%, GIDD, mild MR/ AR,, AA 3.9cm,  ETT 08-13-2017 no ischemia   Pre-diabetes    Psoriasis     His Past Surgical History Is Significant For: Past Surgical History:  Procedure Laterality Date   APPENDECTOMY     age 4   COLONOSCOPY WITH PROPOFOL   09/30/2022   dr saintclair   JOINT REPLACEMENT  01/07/2023   KNEE CLOSED REDUCTION Left 03/02/2023   Procedure: CLOSED MANIPULATION KNEE;  Surgeon: Edna Toribio LABOR, MD;  Location: [facility];  Service: Orthopedics;  Laterality: Left;   RADIOACTIVE SEED IMPLANT N/A 01/21/2022   Procedure: RADIOACTIVE SEED IMPLANT/BRACHYTHERAPY IMPLANT;  Surgeon: Rosalind Zachary NOVAK, MD;  Location: [facility];  Service: Urology;  Laterality: N/A;  90 MINS   SPACE OAR INSTILLATION N/A 01/21/2022   Procedure: SPACE OAR INSTILLATION;  Surgeon: Rosalind Zachary NOVAK, MD;  Location: [facility];  Service: Urology;  Laterality: N/A;   TOTAL KNEE ARTHROPLASTY Left 01/07/2023   @ [facility] by dr edna    His Family History Is Significant For: Family History  Problem Relation Age of Onset   Hypertension Mother    Heart disease Mother    Alcohol abuse Mother    Cancer Father        prostate cancer   Sleep apnea Brother     His Social History Is Significant For: Social History   Socioeconomic History   Marital status: Single    Spouse name: Not on file   Number of children: Not on file   Years of  education: Not on file   Highest education level: Bachelor's degree (e.g., BA, AB, BS)  Occupational History   Occupation: picture frame/artist    Employer: [employer]  Tobacco Use   Smoking status: Former    Types: Pipe   Smokeless tobacco: Never   Tobacco comments:    yuck!  Vaping Use   Vaping status: Never Used  Substance and Sexual Activity   Alcohol use: Yes    Alcohol/week: 5.0 standard drinks of alcohol    Types: 3 Glasses of wine, 2 Standard drinks or equivalent per week   Drug use: No   Sexual activity: Yes    Birth control/protection: Condom  Other Topics Concern   Not on file  Social History Narrative   Right handed   Lives alone mostly, has fiance   Caffeine: a little over 1 cup/day   Social Drivers of Corporate investment banker Strain: Low Risk  (07/12/2024)   Overall Financial Resource Strain (CARDIA)    Difficulty of Paying Living Expenses: Not hard at all  Food Insecurity: No Food Insecurity (07/12/2024)   Hunger Vital Sign    Worried About Running Out of Food in the Last Year: Never true    Ran Out of Food in the Last Year: Never true  Transportation Needs: No Transportation Needs (07/12/2024)   PRAPARE - Administrator, Civil Service (Medical): No    Lack of Transportation (Non-Medical): No  Physical Activity: Insufficiently Active (07/12/2024)   Exercise Vital Sign    Days of Exercise per Week: 1 day    Minutes of Exercise per Session: 60 min  Stress: No Stress Concern Present (07/12/2024)   John Wagner [employer] Occupational Health - Occupational Stress Questionnaire    Feeling of Stress: Only a little  Social Connections: Moderately Isolated (07/12/2024)   Social Connection and Isolation Panel    Frequency of Communication with Friends and Family: Three times a week    Frequency of Social Gatherings with Friends and Family: Three times a week    Attends Religious Services: Patient declined    Active Member of Clubs or Organizations:  Yes    Attends Banker Meetings: More than 4 times per year    Marital Status: Divorced    His Allergies Are:  Allergies  Allergen Reactions   Buspar [Buspirone]     Pt stated does remember reaction but does remember it was allergic reaction   Mobic [Meloxicam] Nausea And Vomiting   Statins     myalgias  :   His Current Medications Are:  Outpatient Encounter Medications as of 08/11/2024  Medication Sig   acyclovir  (ZOVIRAX ) 400 MG tablet TAKE ONE TABLET BY MOUTH THREE TIMES A DAY AS NEEDED FOR OUTBREAKS   ALPRAZolam  (XANAX ) 1 MG tablet TAKE 1/2 TABLET  AT NOON, THEN TAKE 1/2 TABLET BY MOUTH EVERY NIGHT AT BEDTIME MAY TAKE WHOLE TABLET IF NEEDED ON OCCASION   betamethasone valerate lotion (VALISONE) 0.1 % Apply 1 application  topically 2 (two) times daily as needed.   hydrOXYzine  (ATARAX ) 25 MG tablet TAKE 1 TABLET BY MOUTH EVERY 8 HOURS AS NEEDED FOR ITCHING   lisinopril  (ZESTRIL ) 5 MG tablet Take 1 tablet (5 mg total) by mouth daily.   omeprazole  (PRILOSEC) 40 MG capsule Take 1 capsule (40 mg total) by mouth daily.   propranolol  (INDERAL ) 10 MG tablet TAKE 1 TABLET BY MOUTH 2 TIMES A DAY AS NEEDED FOR PERFORMANCE ANXIETY   REPATHA  SURECLICK 140 MG/ML SOAJ INJECT 140 MGS UNDER THE SKIN EVERY 14 DAYS   tadalafil  (CIALIS ) 20 MG tablet Take 1 tablet (20 mg total) by mouth daily as needed for erectile dysfunction.   tamsulosin  (FLOMAX ) 0.4 MG CAPS capsule Take 1 capsule (0.4 mg total) by mouth daily after supper. (Patient taking differently: Take 0.4 mg by mouth daily after supper.)   traMADol  (ULTRAM ) 50 MG tablet TAKE 1 TABLET BY MOUTH EVERY 8 HOURS AS NEEDED   Trolamine Salicylate (ASPERCREME EX) Apply topically as needed.   No facility-administered encounter medications on file as of 08/11/2024.  :   Review of Systems:  Out of a complete 14 point review of systems, all are reviewed and negative with the exception of these symptoms as listed below:  Review of Systems   Neurological:        Patient is here alone for sleep consult. He states he snores loudly. He has a mouth guard but it is very uncomfortable for his jaw. He can have pain for days after wearing it. Patient states he doesn't have any trouble sleeping. He believes he has had chronic fatigue since his 30s. He has had some trouble being sleepy during the day. He has never had a sleep study. One of his brothers has severe sleep apnea and uses a cpap machine. His older brother wakes up in a panic at night but they do not know if he has sleep apnea. ESS 8     Objective:  Neurological Exam  Physical Exam Physical Examination:   Vitals:   08/11/24 1133  BP: (!) 143/89  Pulse: 66    General Examination: The patient is a very pleasant 73 y.o. male in no acute distress. He appears well-developed and well-nourished and well groomed.   HEENT: Normocephalic, atraumatic, pupils are equal, round and reactive to light, extraocular tracking is good without limitation to gaze excursion or nystagmus noted. Hearing is mildly impaired.   Face is symmetric with normal facial animation. Speech is clear with no dysarthria noted. There is no hypophonia. There is no lip, neck/head, jaw or voice tremor. Neck is supple with full range of passive and active motion. There are no carotid bruits on auscultation. Oropharynx exam reveals: mild mouth dryness, adequate dental hygiene and moderate airway crowding, due to small airway entry, prominent uvula, tonsils small, Mallampati class III.  Neck circumference 16 inches, mild overbite noted.  Tongue protrudes centrally and palate elevates symmetrically.  Chest: Clear to auscultation without wheezing, rhonchi or crackles noted.  Heart: S1+S2+0, regular and normal without murmurs, rubs or gallops noted.   Abdomen: Soft, non-tender and non-distended.  Extremities: There is no pitting edema in the distal lower extremities bilaterally.   Skin: Warm and dry without trophic  changes noted.   Musculoskeletal: exam reveals no obvious joint deformities.  Neurologically:  Mental status: The patient is awake, alert and oriented in all 4 spheres. His immediate and remote memory, attention, language skills and fund of knowledge are appropriate. There is no evidence of aphasia, agnosia, apraxia or anomia. Speech is clear with normal prosody and enunciation. Thought process is linear. Mood is normal and affect is normal.  Cranial nerves II - XII are as described above under HEENT exam.  Motor exam: Normal bulk, moving all 4 extremities without restriction, no obvious action or resting tremor.  Fine motor skills and coordination: grossly intact.  Cerebellar testing: No dysmetria or intention tremor. There is no truncal or gait ataxia.  Sensory exam: intact to light touch in the upper and lower extremities.  Gait, station and balance: He stands easily. No veering to one side is noted. No leaning to one side is noted. Posture is age-appropriate and stance is narrow based. Gait shows normal stride length and normal pace. No problems turning are noted.   Assessment and Plan:  In summary, John Toren. is a very pleasant 73 y.o.-year old male with an underlying medical history of allergies, hypertension, hyperlipidemia, prostate cancer, prediabetes, psoriasis, arthritis, status post left total knee arthroplasty, BPH, anxiety, and mild obesity, whose history and physical exam are concerning for sleep disordered breathing, particularly obstructive sleep apnea (OSA). A laboratory attended sleep study is typically considered gold standard for evaluation of sleep disordered breathing.   I had a long chat with the patient about my findings and the diagnosis of sleep apnea, particularly OSA, its prognosis and treatment options. We talked about medical/conservative treatments, surgical interventions and non-pharmacological approaches for symptom control. I explained, in particular, the  risks and ramifications of untreated moderate to severe OSA, especially with respect to developing cardiovascular disease down the road, including congestive heart failure (CHF), difficult to treat hypertension, cardiac arrhythmias (particularly A-fib), neurovascular complications including TIA, stroke and dementia. Even type 2 diabetes has, in part, been linked to untreated OSA. Symptoms of untreated OSA may include (but may not be limited to) daytime sleepiness, nocturia (i.e. frequent nighttime urination), memory problems, mood irritability and suboptimally controlled or worsening mood disorder such as depression and/or anxiety, lack of energy, lack of motivation, physical discomfort, as well as recurrent headaches, especially morning or nocturnal headaches. We talked about the importance of maintaining a healthy lifestyle and striving for healthy weight.  In addition, we talked about the importance of striving for and maintaining good sleep hygiene.  Furthermore, he is encouraged to scale back on his nearly daily alcohol consumption. I recommended a sleep study at this time. I outlined the differences between a laboratory attended sleep study which is considered more comprehensive and accurate over the option of a home sleep test (HST); the latter may lead to underestimation of sleep disordered breathing in some instances and does not help with diagnosing upper airway resistance syndrome and is not accurate enough to diagnose primary central sleep apnea typically. I outlined possible surgical and non-surgical treatment options of OSA, including the use of a positive airway pressure (PAP) device (i.e. CPAP, AutoPAP/APAP or BiPAP in certain circumstances), a custom-made dental device (aka oral appliance, which would require a referral to a specialist dentist or orthodontist typically, and is generally speaking not considered for patients with full dentures or edentulous state), upper airway surgical options,  such as traditional UPPP (which is not considered a first-line treatment) or the Inspire device (hypoglossal nerve stimulator, which would involve a referral for consultation with an ENT  surgeon, after careful selection, following inclusion criteria - also not first-line treatment). I explained the PAP treatment option to the patient in detail, as this is generally considered first-line treatment.  The patient indicated that he would be willing to try PAP therapy, if the need arises. I explained the importance of being compliant with PAP treatment, not only for insurance purposes but primarily to improve patient's symptoms symptoms, and for the patient's long term health benefit, including to reduce His cardiovascular risks longer-term.    We will pick up our discussion about the next steps and treatment options after testing.  We will keep him posted as to the test results by phone call and/or MyChart messaging where possible.  We will plan to follow-up in sleep clinic accordingly as well.  I answered all his questions today and the patient was in agreement.   I encouraged him to call with any interim questions, concerns, problems or updates or email us  through MyChart.  Generally speaking, sleep test authorizations may take up to 2 weeks, sometimes less, sometimes longer, the patient is encouraged to get in touch with us  if they do not hear back from the sleep lab staff directly within the next 2 weeks.  Thank you very much for allowing me to participate in the care of this nice patient. If I can be of any further assistance to you please do not hesitate to call me at [phone].  Sincerely,   True Mar, MD, PhD

## 2024-08-11 NOTE — Patient Instructions (Signed)

## 2024-08-16 ENCOUNTER — Telehealth: Payer: Self-pay | Admitting: Neurology

## 2024-08-16 NOTE — Telephone Encounter (Signed)
 [facility] HTA pending

## 2024-08-19 ENCOUNTER — Encounter (HOSPITAL_COMMUNITY): Payer: Self-pay | Admitting: *Deleted

## 2024-08-23 ENCOUNTER — Other Ambulatory Visit: Payer: Self-pay | Admitting: Family Medicine

## 2024-08-23 ENCOUNTER — Other Ambulatory Visit: Payer: Self-pay | Admitting: Cardiology

## 2024-08-23 DIAGNOSIS — G8929 Other chronic pain: Secondary | ICD-10-CM

## 2024-08-24 ENCOUNTER — Encounter: Payer: Self-pay | Admitting: Pharmacist

## 2024-08-24 NOTE — Progress Notes (Signed)
 Pharmacy Quality Measure Review  This patient is appearing on a report for being at risk of failing the adherence measure for hypertension (ACEi/ARB) medications this calendar year.   Medication: lisinopril   Last fill date: 06/21/2024 for 90 day supply per adherence report - patient is not due to fill again yet to not sure why he is showing on report.  LID is noted to be 09/10/2024 but again patient's next refill not due until around 09/19/2024.   Reviewed his refill history - patient has filled lisinopril  twice in 2025 - 01/02/2024 for 90 days and 06/21/2024 for 90 days Patient missed 80 days between January fill and July fill - has failed [facility] for 2025 but will continue to follow to make sure he is taking lisinopril .   Madelin Ray, PharmD Clinical Pharmacist [facility]

## 2024-08-29 NOTE — Telephone Encounter (Signed)
 NPSG HTA shara: [id number] (exp. 08/16/24 to 11/14/24)

## 2024-08-31 ENCOUNTER — Ambulatory Visit (HOSPITAL_COMMUNITY)
Admission: RE | Admit: 2024-08-31 | Discharge: 2024-08-31 | Disposition: A | Discharge status: Home / Self Care | Source: Ambulatory Visit | Attending: Cardiovascular Disease | Admitting: Cardiovascular Disease

## 2024-08-31 DIAGNOSIS — I454 Nonspecific intraventricular block: Secondary | ICD-10-CM | POA: Insufficient documentation

## 2024-08-31 LAB — EXERCISE TOLERANCE TEST
Angina Index: 0
Duke Treadmill Score: 4
Estimated workload: 6.2
Exercise duration (min): 4 min
Exercise duration (sec): 24 s
MPHR: 149 {beats}/min
Peak HR: 131 {beats}/min
Percent HR: 87 %
Rest HR: 65 {beats}/min
ST Depression (mm): 0 mm

## 2024-08-31 NOTE — Telephone Encounter (Signed)
 NPSG HTA shara: [id number] (exp. 08/16/24 to 11/14/24)   Patient is scheduled at [facility] for 09/27/24 at 8 pm.  Mailed packet and sent mychart.

## 2024-09-01 ENCOUNTER — Ambulatory Visit: Payer: Self-pay | Admitting: Physician Assistant

## 2024-09-05 ENCOUNTER — Telehealth: Payer: Self-pay | Admitting: Pharmacist

## 2024-09-05 MED ORDER — REPATHA SURECLICK 140 MG/ML ~~LOC~~ SOAJ: 140.0000 mg | SUBCUTANEOUS | 11 refills | Status: AC

## 2024-09-05 NOTE — Progress Notes (Addendum)
 Pharmacy Quality Measure Review  This patient is appearing on a report for being at risk of failing the adherence measure for hypertension (ACEi/ARB) medications this calendar year.   Medication: lisinopril   Last fill date: 06/21/2024 for 90 day supply per adherence report - Called [facility] and they verified patient picked up this refill on 06/25/2024. He has 1 refill remaining. Patient is not due to fill again yet but his LID is noted to be 09/10/2024 because per his refill history he missed about 80 days between his first fill of 2025 in January 2025 and second fill in July 2025. filled lisinopril  twice in 2025 - 01/02/2024 for 90 days and 06/21/2024 for 90 days  Verified he does has 1 refill remaining at [facility]. Next appointment with Dr Watt in January 2026.  Sent patient MyChart reminded that lisinopril  should be due for refills soon.  Madelin Ray, PharmD Clinical Pharmacist [facility]   09/16/2024 - patient has 1 refill remaining on lisinopril  Rx. Per his MyChart message he does not need fill yet but is aware there is a refill available when he does need a refill.  Madelin Ray, PharmD Clinical Pharmacist [facility]  Population Health [phone]

## 2024-09-06 NOTE — Telephone Encounter (Signed)
 Looks like Repatha  was filled yesterday 09/05/2024. Tried to call [facility] 3 times to confirm that Repatha  was filled but unable to speak to pharmacy representative. Could only leave a message.  Sent patient a MyChart message about Repatha .

## 2024-09-07 ENCOUNTER — Encounter: Payer: Self-pay | Admitting: Family Medicine

## 2024-09-07 ENCOUNTER — Telehealth: Admitting: Physician Assistant

## 2024-09-07 DIAGNOSIS — L237 Allergic contact dermatitis due to plants, except food: Secondary | ICD-10-CM | POA: Diagnosis not present

## 2024-09-07 MED ORDER — PREDNISONE 10 MG PO TABS: ORAL_TABLET | ORAL | 0 refills | Status: DC

## 2024-09-07 NOTE — Patient Instructions (Signed)
 John Wagner., thank you for joining [name], PA-C for today's virtual visit.  While this provider is not your primary care provider (PCP), if your PCP is located in our provider database this encounter information will be shared with them immediately following your visit.   A [facility] MyChart account gives you access to today's visit and all your visits, tests, and labs performed at [facility]  click here if you don't have a  MyChart account or go to [email or web address]  Consent: (Patient) John Wagner. provided verbal consent for this virtual visit at the beginning of the encounter.  Current Medications:  Current Outpatient Medications:    predniSONE  (DELTASONE ) 10 MG tablet, Take 4 tablets (40 mg total) by mouth daily with breakfast for 4 days, THEN 3 tablets (30 mg total) daily with breakfast for 4 days, THEN 2 tablets (20 mg total) daily with breakfast for 3 days, THEN 1 tablet (10 mg total) daily with breakfast for 3 days., Disp: 37 tablet, Rfl: 0   acyclovir  (ZOVIRAX ) 400 MG tablet, TAKE ONE TABLET BY MOUTH THREE TIMES A DAY AS NEEDED FOR OUTBREAKS, Disp: 45 tablet, Rfl: 3   ALPRAZolam  (XANAX ) 1 MG tablet, TAKE 1/2 TABLET AT NOON, THEN TAKE 1/2 TABLET BY MOUTH EVERY NIGHT AT BEDTIME MAY TAKE WHOLE TABLET IF NEEDED ON OCCASION, Disp: 60 tablet, Rfl: 3   betamethasone valerate lotion (VALISONE) 0.1 %, Apply 1 application  topically 2 (two) times daily as needed., Disp: , Rfl:    Evolocumab  (REPATHA  SURECLICK) 140 MG/ML SOAJ, Inject 140 mg into the skin every 14 (fourteen) days., Disp: 2 mL, Rfl: 11   hydrOXYzine  (ATARAX ) 25 MG tablet, TAKE 1 TABLET BY MOUTH EVERY 8 HOURS AS NEEDED FOR ITCHING, Disp: 45 tablet, Rfl: 0   lisinopril  (ZESTRIL ) 5 MG tablet, Take 1 tablet (5 mg total) by mouth daily., Disp: 90 tablet, Rfl: 1   omeprazole  (PRILOSEC) 40 MG capsule, Take 1 capsule (40 mg total) by mouth daily., Disp: 30 capsule, Rfl: 3   propranolol   (INDERAL ) 10 MG tablet, TAKE 1 TABLET BY MOUTH 2 TIMES A DAY AS NEEDED FOR PERFORMANCE ANXIETY, Disp: 30 tablet, Rfl: 3   tadalafil  (CIALIS ) 20 MG tablet, Take 1 tablet (20 mg total) by mouth daily as needed for erectile dysfunction., Disp: 30 tablet, Rfl: 2   tamsulosin  (FLOMAX ) 0.4 MG CAPS capsule, Take 1 capsule (0.4 mg total) by mouth daily after supper. (Patient taking differently: Take 0.4 mg by mouth daily after supper.), Disp: 30 capsule, Rfl: 2   traMADol  (ULTRAM ) 50 MG tablet, TAKE 1 TABLET BY MOUTH EVERY 8 HOURS AS NEEDED, Disp: 30 tablet, Rfl: 2   Trolamine Salicylate (ASPERCREME EX), Apply topically as needed., Disp: , Rfl:    Medications ordered in this encounter:  Meds ordered this encounter  Medications   predniSONE  (DELTASONE ) 10 MG tablet    Sig: Take 4 tablets (40 mg total) by mouth daily with breakfast for 4 days, THEN 3 tablets (30 mg total) daily with breakfast for 4 days, THEN 2 tablets (20 mg total) daily with breakfast for 3 days, THEN 1 tablet (10 mg total) daily with breakfast for 3 days.    Dispense:  37 tablet    Refill:  0    Supervising Provider:   BLAISE ALEENE KIDD [phone]     *If you need refills on other medications prior to your next appointment, please contact your pharmacy*  Follow-Up: Call back or seek an in-person evaluation  if the symptoms worsen or if the condition fails to improve as anticipated.  [facility] [phone]  Other Instructions Poison Ivy Dermatitis Poison ivy dermatitis is redness and soreness of the skin caused by chemicals in the leaves of the poison ivy plant. You may have very bad itching, swelling, a rash, and blisters. What are the causes? Touching a poison ivy plant. Touching something that has the chemical on it. This may include animals or objects that have come in contact with the plant. What increases the risk? Going outdoors often in wooded or [place] areas. Going outdoors without wearing protective  clothing, such as closed shoes, long pants, and a long-sleeved shirt. What are the signs or symptoms?  Skin redness. Very bad itching. A rash that often includes bumps and blisters. The rash usually appears 48 hours after exposure, if you have had it before. If this is the first time you have it, the rash may not appear until a week after exposure. Swelling. This may occur if the reaction is very bad. Symptoms usually last for 1-2 weeks. The first time you get this condition, symptoms may last 3-4 weeks. How is this treated? This condition may be treated with: Hydrocortisone  cream or calamine lotion to relieve itching. Oatmeal baths to soothe the skin. Medicines, such as over-the-counter antihistamine tablets. Oral steroid medicine for very bad reactions. Follow these instructions at home: Medicines Take or apply over-the-counter and prescription medicines only as told by your doctor. Use hydrocortisone  cream or calamine lotion as needed to help with itching. General instructions Do not scratch or rub your skin. Put a cold, wet cloth (cold compress) on the affected areas or take baths in cool water. This will help with itching. Avoid hot baths and showers. Take oatmeal baths as needed. Use colloidal oatmeal. You can get this at a pharmacy or grocery store. Follow the instructions on the package. While you have the rash, wash your clothes right after you wear them. Check the affected area every day for signs of infection. Check for: More redness, swelling, or pain. Fluid or blood. Warmth. Pus or a bad smell. Keep all follow-up visits. Your doctor may want to see how your skin is doing with treatment. How is this prevented?  Know what poison ivy looks like, so you can avoid it. This plant has three leaves with flowering branches on a single stem. The leaves are glossy. The leaves have uneven edges that come to a point. If you touch poison ivy, wash your skin with soap and water  right away. Be sure to wash under your fingernails. When hiking or camping, wear long pants, a long-sleeved shirt, long socks, and hiking boots. You can also use a lotion on your skin that helps to prevent contact with poison ivy. If you think that your clothes or outdoor gear came in contact with poison ivy, rinse them off with a garden hose before you bring them inside your house. When doing yard work or gardening, wear gloves, long sleeves, long pants, and boots. Wash your garden tools and gloves if they come in contact with poison ivy. If you think that your pet has come into contact with poison ivy, wash them with pet shampoo and water. Make sure to wear gloves while washing your pet. Contact a doctor if: You have open sores in the rash area. You have any signs of infection. You have redness that spreads past the rash area. You have a fever. You have a rash  over a large area of your body. You have a rash on your eyes, mouth, or genitals. Your rash does not get better after a few weeks. Get help right away if: Your face swells or your eyes swell shut. You have trouble breathing. You have trouble swallowing. These symptoms may be an emergency. Do not wait to see if the symptoms will go away. Get help right away. Call 911. This information is not intended to replace advice given to you by your health care provider. Make sure you discuss any questions you have with your health care provider. Document Revised: 05/08/2022 Document Reviewed: 05/08/2022 Elsevier Patient Education  2024 Elsevier Inc.   If you have been instructed to have an in-person evaluation today at a local Urgent Care facility, please use the link below. It will take you to a list of all of our available [facility], including address, phone number and hours of operation. Please do not delay care.  [facility]  If you or a family member do not have a primary care provider, use the link below to  schedule a visit and establish care. When you choose a [facility] primary care physician or advanced practice provider, you gain a long-term partner in health. Find a Primary Care Provider  Learn more about [place]'s in-office and virtual care options:  - Get Care Now

## 2024-09-07 NOTE — Progress Notes (Signed)
 Virtual Visit Consent   John Cappella., you are scheduled for a virtual visit with a [facility] provider today. Just as with appointments in the office, your consent must be obtained to participate. Your consent will be active for this visit and any virtual visit you may have with one of our providers in the next 365 days. If you have a MyChart account, a copy of this consent can be sent to you electronically.  As this is a virtual visit, video technology does not allow for your provider to perform a traditional examination. This may limit your provider's ability to fully assess your condition. If your provider identifies any concerns that need to be evaluated in person or the need to arrange testing (such as labs, EKG, etc.), we will make arrangements to do so. Although advances in technology are sophisticated, we cannot ensure that it will always work on either your end or our end. If the connection with a video visit is poor, the visit may have to be switched to a telephone visit. With either a video or telephone visit, we are not always able to ensure that we have a secure connection.  By engaging in this virtual visit, you consent to the provision of healthcare and authorize for your insurance to be billed (if applicable) for the services provided during this visit. Depending on your insurance coverage, you may receive a charge related to this service.  I need to obtain your verbal consent now. Are you willing to proceed with your visit today? John Wagner. has provided verbal consent on 09/07/2024 for a virtual visit (video or telephone). John Wagner, [place]  Date: 09/07/2024 1:46 PM   Virtual Visit via Video Note   I, John Wagner, connected with  John Wagner.  ([id number], 1951-02-21) on 09/07/24 at  1:45 PM EDT by a video-enabled telemedicine application and verified that I am speaking with the correct person using two identifiers.  Location: Patient: Virtual Visit  Location Patient: Home Provider: Virtual Visit Location Provider: Home Office   I discussed the limitations of evaluation and management by telemedicine and the availability of in person appointments. The patient expressed understanding and agreed to proceed.    History of Present Illness: John Tennyson. is a 73 y.o. who identifies as a male who was assigned male at birth, and is being seen today for pruritic rash of R hand first noted on Sunday. Notes he was cutting the grass and pulling weeds over the weekend, noting some ivy growing which he did pull as well. Notes Sunday starting with mild rash in the webbing between fingers of R hand which has progressed, now involving the L hand and arm as well. Notes history of substantial rash with poison ivy/oak. Denies fever, chills. Is applying his betamethasone cream to the areas so far.  HPI: HPI  Problems:  Patient Active Problem List   Diagnosis Date Noted   Prediabetes 02/11/2022   Overweight (BMI 25.0-29.9) 02/11/2022   Malignant neoplasm of prostate (HCC) 10/25/2021   Elevated PSA    BPH (benign prostatic hyperplasia) 12/25/2014   Erectile dysfunction 03/10/2014   Anxiety 12/06/2012   Psoriasis 12/06/2012   Hypertension    Hyperlipidemia     Allergies:  Allergies  Allergen Reactions   Buspar [Buspirone]     Pt stated does remember reaction but does remember it was allergic reaction   Mobic [Meloxicam] Nausea And Vomiting   Statins     myalgias   Medications:  Current Outpatient Medications:    predniSONE  (DELTASONE ) 10 MG tablet, Take 4 tablets (40 mg total) by mouth daily with breakfast for 4 days, THEN 3 tablets (30 mg total) daily with breakfast for 4 days, THEN 2 tablets (20 mg total) daily with breakfast for 3 days, THEN 1 tablet (10 mg total) daily with breakfast for 3 days., Disp: 37 tablet, Rfl: 0   acyclovir  (ZOVIRAX ) 400 MG tablet, TAKE ONE TABLET BY MOUTH THREE TIMES A DAY AS NEEDED FOR OUTBREAKS, Disp: 45 tablet,  Rfl: 3   ALPRAZolam  (XANAX ) 1 MG tablet, TAKE 1/2 TABLET AT NOON, THEN TAKE 1/2 TABLET BY MOUTH EVERY NIGHT AT BEDTIME MAY TAKE WHOLE TABLET IF NEEDED ON OCCASION, Disp: 60 tablet, Rfl: 3   betamethasone valerate lotion (VALISONE) 0.1 %, Apply 1 application  topically 2 (two) times daily as needed., Disp: , Rfl:    Evolocumab  (REPATHA  SURECLICK) 140 MG/ML SOAJ, Inject 140 mg into the skin every 14 (fourteen) days., Disp: 2 mL, Rfl: 11   hydrOXYzine  (ATARAX ) 25 MG tablet, TAKE 1 TABLET BY MOUTH EVERY 8 HOURS AS NEEDED FOR ITCHING, Disp: 45 tablet, Rfl: 0   lisinopril  (ZESTRIL ) 5 MG tablet, Take 1 tablet (5 mg total) by mouth daily., Disp: 90 tablet, Rfl: 1   omeprazole  (PRILOSEC) 40 MG capsule, Take 1 capsule (40 mg total) by mouth daily., Disp: 30 capsule, Rfl: 3   propranolol  (INDERAL ) 10 MG tablet, TAKE 1 TABLET BY MOUTH 2 TIMES A DAY AS NEEDED FOR PERFORMANCE ANXIETY, Disp: 30 tablet, Rfl: 3   tadalafil  (CIALIS ) 20 MG tablet, Take 1 tablet (20 mg total) by mouth daily as needed for erectile dysfunction., Disp: 30 tablet, Rfl: 2   tamsulosin  (FLOMAX ) 0.4 MG CAPS capsule, Take 1 capsule (0.4 mg total) by mouth daily after supper. (Patient taking differently: Take 0.4 mg by mouth daily after supper.), Disp: 30 capsule, Rfl: 2   traMADol  (ULTRAM ) 50 MG tablet, TAKE 1 TABLET BY MOUTH EVERY 8 HOURS AS NEEDED, Disp: 30 tablet, Rfl: 2   Trolamine Salicylate (ASPERCREME EX), Apply topically as needed., Disp: , Rfl:   Observations/Objective: Patient is well-developed, well-nourished in no acute distress.  Resting comfortably at home.  Head is normocephalic, atraumatic.  No labored breathing.  Speech is clear and coherent with logical content.  Patient is alert and oriented at baseline.  Erythematous papulovesicular rash noted of hands bilaterally, worse R>L. Some scattered lesions of forearms.  Assessment and Plan: 1. Allergic contact dermatitis due to plants, except food (Primary) - predniSONE   (DELTASONE ) 10 MG tablet; Take 4 tablets (40 mg total) by mouth daily with breakfast for 4 days, THEN 3 tablets (30 mg total) daily with breakfast for 4 days, THEN 2 tablets (20 mg total) daily with breakfast for 3 days, THEN 1 tablet (10 mg total) daily with breakfast for 3 days.  Dispense: 37 tablet; Refill: 0  Supportive measures and OTC medications reviewed. Ok to continue topical Betamethasone. Prednisone  taper per orders. In person follow-up precautions reviewed.  Follow Up Instructions: I discussed the assessment and treatment plan with the patient. The patient was provided an opportunity to ask questions and all were answered. The patient agreed with the plan and demonstrated an understanding of the instructions.  A copy of instructions were sent to the patient via MyChart unless otherwise noted below.   The patient was advised to call back or seek an in-person evaluation if the symptoms worsen or if the condition fails to improve as anticipated.  John Velma Lunger, PA-C

## 2024-09-16 DIAGNOSIS — L570 Actinic keratosis: Secondary | ICD-10-CM | POA: Diagnosis not present

## 2024-09-16 DIAGNOSIS — D2371 Other benign neoplasm of skin of right lower limb, including hip: Secondary | ICD-10-CM | POA: Diagnosis not present

## 2024-09-16 DIAGNOSIS — L400 Psoriasis vulgaris: Secondary | ICD-10-CM | POA: Diagnosis not present

## 2024-09-16 DIAGNOSIS — L821 Other seborrheic keratosis: Secondary | ICD-10-CM | POA: Diagnosis not present

## 2024-09-19 ENCOUNTER — Ambulatory Visit: Admitting: Physician Assistant

## 2024-09-20 NOTE — Telephone Encounter (Signed)
 John Wagner returned call - patient picked up Repatha  Rx 09/07/2024

## 2024-09-21 ENCOUNTER — Telehealth: Admitting: Physician Assistant

## 2024-09-21 DIAGNOSIS — B9689 Other specified bacterial agents as the cause of diseases classified elsewhere: Secondary | ICD-10-CM | POA: Diagnosis not present

## 2024-09-21 DIAGNOSIS — J208 Acute bronchitis due to other specified organisms: Secondary | ICD-10-CM | POA: Diagnosis not present

## 2024-09-21 MED ORDER — BENZONATATE 100 MG PO CAPS: 100.0000 mg | ORAL_CAPSULE | Freq: Three times a day (TID) | ORAL | 0 refills | Status: AC | PRN

## 2024-09-21 MED ORDER — DOXYCYCLINE HYCLATE 100 MG PO TABS: 100.0000 mg | ORAL_TABLET | Freq: Two times a day (BID) | ORAL | 0 refills | Status: AC

## 2024-09-21 NOTE — Progress Notes (Signed)
 Virtual Visit Consent   John Quam., you are scheduled for a virtual visit with a [facility] provider today. Just as with appointments in the office, your consent must be obtained to participate. Your consent will be active for this visit and any virtual visit you may have with one of our providers in the next 365 days. If you have a MyChart account, a copy of this consent can be sent to you electronically.  As this is a virtual visit, video technology does not allow for your provider to perform a traditional examination. This may limit your provider's ability to fully assess your condition. If your provider identifies any concerns that need to be evaluated in person or the need to arrange testing (such as labs, EKG, etc.), we will make arrangements to do so. Although advances in technology are sophisticated, we cannot ensure that it will always work on either your end or our end. If the connection with a video visit is poor, the visit may have to be switched to a telephone visit. With either a video or telephone visit, we are not always able to ensure that we have a secure connection.  By engaging in this virtual visit, you consent to the provision of healthcare and authorize for your insurance to be billed (if applicable) for the services provided during this visit. Depending on your insurance coverage, you may receive a charge related to this service.  I need to obtain your verbal consent now. Are you willing to proceed with your visit today? John Debby Raddle. has provided verbal consent on 09/21/2024 for a virtual visit (video or telephone). John Wagner, [place]  Date: 09/21/2024 2:10 PM   Virtual Visit via Video Note   I, John Wagner, connected with  John Wagner.  ([id number], [birth year]) on 09/21/24 at  2:00 PM EDT by a video-enabled telemedicine application and verified that I am speaking with the correct person using two identifiers.  Location: Patient: Virtual Visit  Location Patient: Home Provider: Virtual Visit Location Provider: Home Office   I discussed the limitations of evaluation and management by telemedicine and the availability of in person appointments. The patient expressed understanding and agreed to proceed.    History of Present Illness: John Fieldhouse. is a 73 y.o. who identifies as a male who was assigned male at birth, and is being seen today for URI symptoms over past week, worsening substantially since yesterday with nasal congestion, sore throat, persistent cough with change in sputum. Denies fever, chills. Denies recent travel or sick contact.SABRA   HPI: HPI  Problems:  Patient Active Problem List   Diagnosis Date Noted   Prediabetes 02/11/2022   Overweight (BMI 25.0-29.9) 02/11/2022   Malignant neoplasm of prostate (HCC) 10/25/2021   Elevated PSA    BPH (benign prostatic hyperplasia) 12/25/2014   Erectile dysfunction 03/10/2014   Anxiety 12/06/2012   Psoriasis 12/06/2012   Hypertension    Hyperlipidemia     Allergies:  Allergies  Allergen Reactions   Buspar [Buspirone]     Pt stated does remember reaction but does remember it was allergic reaction   Mobic [Meloxicam] Nausea And Vomiting   Statins     myalgias   Medications:  Current Outpatient Medications:    benzonatate (TESSALON) 100 MG capsule, Take 1 capsule (100 mg total) by mouth 3 (three) times daily as needed for cough., Disp: 30 capsule, Rfl: 0   doxycycline (VIBRA-TABS) 100 MG tablet, Take 1 tablet (100 mg total) by mouth  2 (two) times daily., Disp: 14 tablet, Rfl: 0   acyclovir  (ZOVIRAX ) 400 MG tablet, TAKE ONE TABLET BY MOUTH THREE TIMES A DAY AS NEEDED FOR OUTBREAKS, Disp: 45 tablet, Rfl: 3   ALPRAZolam  (XANAX ) 1 MG tablet, TAKE 1/2 TABLET AT NOON, THEN TAKE 1/2 TABLET BY MOUTH EVERY NIGHT AT BEDTIME MAY TAKE WHOLE TABLET IF NEEDED ON OCCASION, Disp: 60 tablet, Rfl: 3   betamethasone valerate lotion (VALISONE) 0.1 %, Apply 1 application  topically 2 (two)  times daily as needed., Disp: , Rfl:    Evolocumab  (REPATHA  SURECLICK) 140 MG/ML SOAJ, Inject 140 mg into the skin every 14 (fourteen) days., Disp: 2 mL, Rfl: 11   hydrOXYzine  (ATARAX ) 25 MG tablet, TAKE 1 TABLET BY MOUTH EVERY 8 HOURS AS NEEDED FOR ITCHING, Disp: 45 tablet, Rfl: 0   lisinopril  (ZESTRIL ) 5 MG tablet, Take 1 tablet (5 mg total) by mouth daily., Disp: 90 tablet, Rfl: 1   omeprazole  (PRILOSEC) 40 MG capsule, Take 1 capsule (40 mg total) by mouth daily., Disp: 30 capsule, Rfl: 3   propranolol  (INDERAL ) 10 MG tablet, TAKE 1 TABLET BY MOUTH 2 TIMES A DAY AS NEEDED FOR PERFORMANCE ANXIETY, Disp: 30 tablet, Rfl: 3   tadalafil  (CIALIS ) 20 MG tablet, Take 1 tablet (20 mg total) by mouth daily as needed for erectile dysfunction., Disp: 30 tablet, Rfl: 2   tamsulosin  (FLOMAX ) 0.4 MG CAPS capsule, Take 1 capsule (0.4 mg total) by mouth daily after supper. (Patient taking differently: Take 0.4 mg by mouth daily after supper.), Disp: 30 capsule, Rfl: 2   traMADol  (ULTRAM ) 50 MG tablet, TAKE 1 TABLET BY MOUTH EVERY 8 HOURS AS NEEDED, Disp: 30 tablet, Rfl: 2   Trolamine Salicylate (ASPERCREME EX), Apply topically as needed., Disp: , Rfl:   Observations/Objective: Patient is well-developed, well-nourished in no acute distress.  Resting comfortably at home.  Head is normocephalic, atraumatic.  No labored breathing.  Speech is clear and coherent with logical content.  Patient is alert and oriented at baseline.   Assessment and Plan: 1. Acute bacterial bronchitis (Primary) - benzonatate (TESSALON) 100 MG capsule; Take 1 capsule (100 mg total) by mouth 3 (three) times daily as needed for cough.  Dispense: 30 capsule; Refill: 0 - doxycycline (VIBRA-TABS) 100 MG tablet; Take 1 tablet (100 mg total) by mouth 2 (two) times daily.  Dispense: 14 tablet; Refill: 0  Rx Doxycycline.  Increase fluids.  Rest.  Saline nasal spray.  Probiotic.  Mucinex  as directed.  Humidifier in bedroom. Tessalon per orders.   Call or return to clinic if symptoms are not improving.   Follow Up Instructions: I discussed the assessment and treatment plan with the patient. The patient was provided an opportunity to ask questions and all were answered. The patient agreed with the plan and demonstrated an understanding of the instructions.  A copy of instructions were sent to the patient via MyChart unless otherwise noted below.    The patient was advised to call back or seek an in-person evaluation if the symptoms worsen or if the condition fails to improve as anticipated.    John Velma Lunger, PA-C

## 2024-09-21 NOTE — Patient Instructions (Signed)
 John Wagner., thank you for joining [name], PA-C for today's virtual visit.  While this provider is not your primary care provider (PCP), if your PCP is located in our provider database this encounter information will be shared with them immediately following your visit.   A [facility] MyChart account gives you access to today's visit and all your visits, tests, and labs performed at [facility]  click here if you don't have a [facility] MyChart account or go to [email or web address]  Consent: (Patient) John Wagner. provided verbal consent for this virtual visit at the beginning of the encounter.  Current Medications:  Current Outpatient Medications:    acyclovir  (ZOVIRAX ) 400 MG tablet, TAKE ONE TABLET BY MOUTH THREE TIMES A DAY AS NEEDED FOR OUTBREAKS, Disp: 45 tablet, Rfl: 3   ALPRAZolam  (XANAX ) 1 MG tablet, TAKE 1/2 TABLET AT NOON, THEN TAKE 1/2 TABLET BY MOUTH EVERY NIGHT AT BEDTIME MAY TAKE WHOLE TABLET IF NEEDED ON OCCASION, Disp: 60 tablet, Rfl: 3   betamethasone valerate lotion (VALISONE) 0.1 %, Apply 1 application  topically 2 (two) times daily as needed., Disp: , Rfl:    Evolocumab  (REPATHA  SURECLICK) 140 MG/ML SOAJ, Inject 140 mg into the skin every 14 (fourteen) days., Disp: 2 mL, Rfl: 11   hydrOXYzine  (ATARAX ) 25 MG tablet, TAKE 1 TABLET BY MOUTH EVERY 8 HOURS AS NEEDED FOR ITCHING, Disp: 45 tablet, Rfl: 0   lisinopril  (ZESTRIL ) 5 MG tablet, Take 1 tablet (5 mg total) by mouth daily., Disp: 90 tablet, Rfl: 1   omeprazole  (PRILOSEC) 40 MG capsule, Take 1 capsule (40 mg total) by mouth daily., Disp: 30 capsule, Rfl: 3   predniSONE  (DELTASONE ) 10 MG tablet, Take 4 tablets (40 mg total) by mouth daily with breakfast for 4 days, THEN 3 tablets (30 mg total) daily with breakfast for 4 days, THEN 2 tablets (20 mg total) daily with breakfast for 3 days, THEN 1 tablet (10 mg total) daily with breakfast for 3 days., Disp: 37 tablet, Rfl: 0   propranolol   (INDERAL ) 10 MG tablet, TAKE 1 TABLET BY MOUTH 2 TIMES A DAY AS NEEDED FOR PERFORMANCE ANXIETY, Disp: 30 tablet, Rfl: 3   tadalafil  (CIALIS ) 20 MG tablet, Take 1 tablet (20 mg total) by mouth daily as needed for erectile dysfunction., Disp: 30 tablet, Rfl: 2   tamsulosin  (FLOMAX ) 0.4 MG CAPS capsule, Take 1 capsule (0.4 mg total) by mouth daily after supper. (Patient taking differently: Take 0.4 mg by mouth daily after supper.), Disp: 30 capsule, Rfl: 2   traMADol  (ULTRAM ) 50 MG tablet, TAKE 1 TABLET BY MOUTH EVERY 8 HOURS AS NEEDED, Disp: 30 tablet, Rfl: 2   Trolamine Salicylate (ASPERCREME EX), Apply topically as needed., Disp: , Rfl:    Medications ordered in this encounter:  No orders of the defined types were placed in this encounter.    *If you need refills on other medications prior to your next appointment, please contact your pharmacy*  Follow-Up: Call back or seek an in-person evaluation if the symptoms worsen or if the condition fails to improve as anticipated.  [facility] [phone]  Other Instructions Please hydrate and rest. Plain Mucinex  OTC as discussed. If you have a humidifier, place it in the bedroom and run it at night. Take your antibiotic and the Tessalon as directed. If you note any non-resolving, new, or worsening symptoms despite treatment, please seek an in-person evaluation ASAP.    If you have been instructed to  have an in-person evaluation today at a local Urgent Care facility, please use the link below. It will take you to a list of all of our available [facility], including address, phone number and hours of operation. Please do not delay care.  [facility]  If you or a family member do not have a primary care provider, use the link below to schedule a visit and establish care. When you choose a [facility] primary care physician or advanced practice provider, you gain a long-term partner in health. Find a  Primary Care Provider  Learn more about [facility]'s in-office and virtual care options:  - Get Care Now

## 2024-09-25 ENCOUNTER — Encounter: Payer: Self-pay | Admitting: Family Medicine

## 2024-09-26 NOTE — Progress Notes (Unsigned)
 Cardiology Office Note   Date:  09/26/2024  ID:  John Gladish., DOB 1951/01/13, MRN [id number] PCP: Watt Harlene BROCKS, MD  [facility] Providers Cardiologist:  Dub Huntsman, DO   History of Present Illness John Berwick. is a 73 y.o. male with a past medical history of hypertension, prediabetes, hyperlipidemia, anxiety, and BPH here for follow-up appointment.  Patient was first seen by our practice August 08, 2021 and at that time presented because of a presyncopal event.  During the visit ZIO monitor and carotid Dopplers were reviewed which were done by his primary care.  Echo was ordered.  Talked about hyperlipidemia and a PCSK9 inhibitor was recommended.  He was last seen February 2023 and at that time was seen in our lipid clinic and started on a PCSK9 inhibitor and continue to take that medication.  Had a radioactive seed implantation in for elevated PCA with prostate cancer.  Recovered from a questionable virus versus flu, potentially COVID.  No other complaints at that time.  I saw him 08/08/2024, he presents with chest burning and a new right bundle branch block on EKG. He was referred by his primary care physician for evaluation of a new right bundle branch block on EKG.  He experienced chest burning, initially managed with omeprazole , which has since subsided. A new right bundle branch block was identified on EKG. Blood pressure readings have varied, with recent measurements of 140/80 mmHg and 142/94 mmHg. He does not regularly monitor blood pressure at home. He uses alprazolam  and beta blockers for performance anxiety, which initially caused arm numbness. He recalls an episode of low blood pressure, with readings of 90/67 mmHg, managed by increasing salt intake. He is on Repatha  for cholesterol management, improving LDL levels to 48 mg/dL, after experiencing pain with statins.  Reports no shortness of breath nor dyspnea on exertion. No edema, orthopnea, PND. Reports no  palpitations.   Discussed the use of AI scribe software for clinical note transcription with the patient, who gave verbal consent to proceed.  Today, he presents for follow-up after a stress test.   He underwent a stress test due to a right bundle branch block, which was negative for ischemia or myocardial infarction, with no concerning EKG changes. His blood pressure was elevated during exercise. He remains asymptomatic during physical activities such as cutting grass and pulling weeds.  He is on Repatha  for cholesterol management, with good control of cholesterol levels as of January. He has prediabetes with a slightly elevated A1c, which is expected to be re-evaluated in January.  He is an active Administrator, sports, performing at various events, and reports having a good lung capacity.  Reports no shortness of breath nor dyspnea on exertion. Reports no chest pain, pressure, or tightness. No edema, orthopnea, PND. Reports no palpitations.   Discussed the use of AI scribe software for clinical note transcription with the patient, who gave verbal consent to proceed.  ROS: Pertinent ROS in HPI  Studies Reviewed      TTE 07/2021 IMPRESSIONS   1. Left ventricular ejection fraction, by estimation, is 55 to 60%. The left ventricle has normal function. The left ventricle has no regional wall motion abnormalities. Left ventricular diastolic parameters are consistent with Grade I diastolic  dysfunction (impaired relaxation).   2. Right ventricular systolic function is normal. The right ventricular size is normal.   3. The mitral valve is normal in structure. Mild mitral valve regurgitation. No evidence of mitral stenosis.   4. The  aortic valve is normal in structure. Aortic valve regurgitation is mild. No aortic stenosis is present.   5. Aneurysm of the ascending aorta, measuring 39 mm.   6. The inferior vena cava is normal in size with greater than 50%  respiratory variability, suggesting right  atrial pressure of 3 mmHg.   FINDINGS   Left Ventricle: Left ventricular ejection fraction, by estimation, is 55  to 60%. The left ventricle has normal function. The left ventricle has no  regional wall motion abnormalities. The left ventricular internal cavity  size was normal in size. There is   borderline left ventricular hypertrophy. Left ventricular diastolic  parameters are consistent with Grade I diastolic dysfunction (impaired  relaxation).   Right Ventricle: The right ventricular size is normal. No increase in  right ventricular wall thickness. Right ventricular systolic function is  normal.   Left Atrium: Left atrial size was normal in size.   Right Atrium: Right atrial size was normal in size.   Pericardium: There is no evidence of pericardial effusion.   Mitral Valve: The mitral valve is normal in structure. Mild mitral valve  regurgitation. No evidence of mitral valve stenosis.   Tricuspid Valve: The tricuspid valve is normal in structure. Tricuspid  valve regurgitation is mild . No evidence of tricuspid stenosis.   Aortic Valve: The aortic valve is normal in structure. Aortic valve  regurgitation is mild. Aortic regurgitation PHT measures 564 msec. No  aortic stenosis is present. Aortic valve mean gradient measures 3.0 mmHg.  Aortic valve peak gradient measures 6.1  mmHg. Aortic valve area, by VTI measures 2.71 cm.   Pulmonic Valve: The pulmonic valve was normal in structure. Pulmonic valve  regurgitation is not visualized. No evidence of pulmonic stenosis.   Aorta: The aortic root is normal in size and structure. There is an  aneurysm involving the ascending aorta measuring 39 mm.   Venous: The inferior vena cava is normal in size with greater than 50%  respiratory variability, suggesting right atrial pressure of 3 mmHg.   IAS/Shunts: No atrial level shunt detected by color flow Doppler.    Physical Exam VS:  There were no vitals taken for this visit.        Wt Readings from Last 3 Encounters:  08/11/24 186 lb (84.4 kg)  08/08/24 185 lb (83.9 kg)  07/13/24 184 lb (83.5 kg)    GEN: Well nourished, well developed in no acute distress NECK: No JVD; No carotid bruits CARDIAC: RRR, no murmurs, rubs, gallops RESPIRATORY:  Clear to auscultation without rales, wheezing or rhonchi  ABDOMEN: Soft, non-tender, non-distended EXTREMITIES:  No edema; No deformity   ASSESSMENT AND PLAN  Right bundle branch block Stress test negative for ischemia or myocardial infarction. Right bundle branch block noted without concerning EKG changes. Elevated blood pressure during exercise not concerning.  Essential hypertension - continue lisinopril   - Continue heart healthy, low-sodium foods -Continue to track blood pressure at home intermittently  Mixed hyperlipidemia Cholesterol well-controlled with Repatha . No immediate re-evaluation needed before January. - Continue Repatha . - Order cholesterol labs at next primary care visit.  Prediabetes Previously addressed with slightly elevated A1c. No immediate concerns. - Watch diet and follow-up with PCP    Dispo: Please return in 6 months to see Dr. Sheena  Signed, John LOISE Fabry, PA-C

## 2024-09-27 ENCOUNTER — Encounter: Payer: Self-pay | Admitting: Physician Assistant

## 2024-09-27 ENCOUNTER — Encounter

## 2024-09-27 ENCOUNTER — Ambulatory Visit: Attending: Physician Assistant | Admitting: Physician Assistant

## 2024-09-27 VITALS — BP 128/86 | HR 62 | Ht 66.0 in | Wt 185.6 lb

## 2024-09-27 DIAGNOSIS — I10 Essential (primary) hypertension: Secondary | ICD-10-CM | POA: Diagnosis not present

## 2024-09-27 DIAGNOSIS — E782 Mixed hyperlipidemia: Secondary | ICD-10-CM | POA: Diagnosis not present

## 2024-09-27 DIAGNOSIS — R7303 Prediabetes: Secondary | ICD-10-CM | POA: Diagnosis not present

## 2024-09-27 DIAGNOSIS — I454 Nonspecific intraventricular block: Secondary | ICD-10-CM | POA: Diagnosis not present

## 2024-09-27 NOTE — Patient Instructions (Signed)
 Medication Instructions:  Your physician recommends that you continue on your current medications as directed. Please refer to the Current Medication list given to you today.  *If you need a refill on your cardiac medications before your next appointment, please call your pharmacy*  Lab Work: None ordered If you have labs (blood work) drawn today and your tests are completely normal, you will receive your results only by: MyChart Message (if you have MyChart) OR A paper copy in the mail If you have any lab test that is abnormal or we need to change your treatment, we will call you to review the results.  Follow-Up: At [facility], you and your health needs are our priority.  As part of our continuing mission to provide you with exceptional heart care, our providers are all part of one team.  This team includes your primary Cardiologist (physician) and Advanced Practice Providers or APPs (Physician Assistants and Nurse Practitioners) who all work together to provide you with the care you need, when you need it.  Your next appointment:   6 month(s)  Provider:   Kardie Tobb, DO

## 2024-10-01 ENCOUNTER — Other Ambulatory Visit: Payer: Self-pay | Admitting: Family Medicine

## 2024-10-01 DIAGNOSIS — R0789 Other chest pain: Secondary | ICD-10-CM

## 2024-10-03 DIAGNOSIS — C61 Malignant neoplasm of prostate: Secondary | ICD-10-CM | POA: Diagnosis not present

## 2024-10-10 DIAGNOSIS — R3912 Poor urinary stream: Secondary | ICD-10-CM | POA: Diagnosis not present

## 2024-10-10 DIAGNOSIS — R3914 Feeling of incomplete bladder emptying: Secondary | ICD-10-CM | POA: Diagnosis not present

## 2024-10-10 DIAGNOSIS — N401 Enlarged prostate with lower urinary tract symptoms: Secondary | ICD-10-CM | POA: Diagnosis not present

## 2024-10-10 DIAGNOSIS — R350 Frequency of micturition: Secondary | ICD-10-CM | POA: Diagnosis not present

## 2024-10-10 DIAGNOSIS — N400 Enlarged prostate without lower urinary tract symptoms: Secondary | ICD-10-CM | POA: Diagnosis not present

## 2024-10-10 DIAGNOSIS — R399 Unspecified symptoms and signs involving the genitourinary system: Secondary | ICD-10-CM | POA: Diagnosis not present

## 2024-10-10 DIAGNOSIS — R351 Nocturia: Secondary | ICD-10-CM | POA: Diagnosis not present

## 2024-10-10 DIAGNOSIS — C61 Malignant neoplasm of prostate: Secondary | ICD-10-CM | POA: Diagnosis not present

## 2024-10-21 NOTE — Progress Notes (Signed)
 John Wagner.                                          MRN: [id number]   10/21/2024   The VBCI Quality Team Specialist reviewed this patient medical record for the purposes of chart review for care gap closure. The following were reviewed: abstraction for care gap closure-controlling blood pressure.    VBCI Quality Team

## 2024-11-04 ENCOUNTER — Other Ambulatory Visit: Payer: Self-pay | Admitting: Family Medicine

## 2024-11-04 DIAGNOSIS — F411 Generalized anxiety disorder: Secondary | ICD-10-CM

## 2024-11-04 DIAGNOSIS — G8929 Other chronic pain: Secondary | ICD-10-CM

## 2024-11-07 ENCOUNTER — Ambulatory Visit: Admitting: Neurology

## 2024-11-07 DIAGNOSIS — Z9189 Other specified personal risk factors, not elsewhere classified: Secondary | ICD-10-CM

## 2024-11-07 DIAGNOSIS — Z820 Family history of epilepsy and other diseases of the nervous system: Secondary | ICD-10-CM

## 2024-11-07 DIAGNOSIS — E66811 Obesity, class 1: Secondary | ICD-10-CM

## 2024-11-07 DIAGNOSIS — G4733 Obstructive sleep apnea (adult) (pediatric): Secondary | ICD-10-CM

## 2024-11-07 DIAGNOSIS — R351 Nocturia: Secondary | ICD-10-CM

## 2024-11-07 DIAGNOSIS — G4720 Circadian rhythm sleep disorder, unspecified type: Secondary | ICD-10-CM

## 2024-11-07 DIAGNOSIS — G4719 Other hypersomnia: Secondary | ICD-10-CM

## 2024-11-07 DIAGNOSIS — R0683 Snoring: Secondary | ICD-10-CM

## 2024-11-10 ENCOUNTER — Ambulatory Visit: Payer: Self-pay | Admitting: Neurology

## 2024-11-10 NOTE — Procedures (Signed)
 Physician Interpretation: Please see link under Procedure Tab or under Encounters tab for physician report, technical report, as well as O2 titration and/or PAP titration tables (if applicable).

## 2024-12-07 ENCOUNTER — Telehealth: Payer: Self-pay | Admitting: Pharmacist

## 2024-12-07 DIAGNOSIS — I10 Essential (primary) hypertension: Secondary | ICD-10-CM

## 2024-12-07 MED ORDER — LISINOPRIL 5 MG PO TABS: 5.0000 mg | ORAL_TABLET | Freq: Every day | ORAL | 0 refills | Status: AC

## 2024-12-07 NOTE — Telephone Encounter (Signed)
 Pharmacy Quality Measure Review  This patient is appearing on a report for being at risk of failing the adherence measure for hypertension (ACEi/ARB) medications this calendar year.   Medication: lisinopril  5mg  Last fill date: 09/16/2024 for 90 day supply  Patient has no refills remaining. Next appointment with PCP is 01/16/2025 - he will need refill before his next appointment  Meds ordered this encounter  Medications   lisinopril  (ZESTRIL ) 5 MG tablet    Sig: Take 1 tablet (5 mg total) by mouth daily.    Dispense:  90 tablet    Refill:  0   Sent in Rx for 90 DS and no refills to last until next PCP follow up.   Madelin Ray, PharmD Clinical Pharmacist [facility]  Population Health [phone]

## 2024-12-30 ENCOUNTER — Other Ambulatory Visit: Payer: Self-pay | Admitting: Family Medicine

## 2024-12-30 DIAGNOSIS — G8929 Other chronic pain: Secondary | ICD-10-CM

## 2025-01-16 ENCOUNTER — Ambulatory Visit: Admitting: Family Medicine

## 2025-01-22 NOTE — Progress Notes (Unsigned)
 Designer, Multimedia at [facility] [address], [address] [place], [place] [address] [phone]  Date:  01/23/2025   Name:  John Wagner.   DOB:  1951/05/19   MRN:  [id number]  PCP:  Watt Harlene BROCKS, MD    Chief Complaint: No chief complaint on file.   History of Present Illness:  John Wagner. is a 74 y.o. very pleasant male patient who presents with the following:  Pt seen today with concern of knee pain Last seen by me in July  History of prediabetes, BPH, hypertension, hyperlipidemia, anxiety treated with xanax , joint pain, prostate cancer diagnosed 2022 and treated with radioactive seed implantation    Prostate cancer is considered to be in remission, monitored by urology  Discussed the use of AI scribe software for clinical note transcription with the patient, who gave verbal consent to proceed.  History of Present Illness     Patient Active Problem List   Diagnosis Date Noted   Prediabetes 02/11/2022   Overweight (BMI 25.0-29.9) 02/11/2022   Malignant neoplasm of prostate (HCC) 10/25/2021   Elevated PSA    BPH (benign prostatic hyperplasia) 12/25/2014   Erectile dysfunction 03/10/2014   Anxiety 12/06/2012   Psoriasis 12/06/2012   Hypertension    Hyperlipidemia     Past Medical History:  Diagnosis Date   Allergy    Ankylosis, left knee    post TKA   Arthritis    BPH (benign prostatic hyperplasia)    Complication of anesthesia    per pt causes memory issues post op   ED (erectile dysfunction)    GAD (generalized anxiety disorder)    History of adenomatous polyp of colon    Hyperlipidemia    Hypertension    Malignant neoplasm prostate (HCC) 09/2021   urologist--- dr newsome/  radiation oncologist--- dr patrcia;  dx 10/ 2022, Gleason, 3+4;   01-21-2022  radioactive prostate seed implants   Postural dizziness with presyncope 07/2021   cardiologist evalution by dr sheena;  tested results in epic ---- event monitor  07-24-2021  shows SR w/ rare PAC/ PVC;  echo 08-13-2021  ef 55-60%, GIDD, mild MR/ AR,, AA 3.9cm,  ETT 08-13-2017 no ischemia   Pre-diabetes    Psoriasis     Past Surgical History:  Procedure Laterality Date   APPENDECTOMY     age 30   COLONOSCOPY WITH PROPOFOL   09/30/2022   dr saintclair   JOINT REPLACEMENT  01/07/2023   KNEE CLOSED REDUCTION Left 03/02/2023   Procedure: CLOSED MANIPULATION KNEE;  Surgeon: Edna Toribio LABOR, MD;  Location: [facility];  Service: Orthopedics;  Laterality: Left;   RADIOACTIVE SEED IMPLANT N/A 01/21/2022   Procedure: RADIOACTIVE SEED IMPLANT/BRACHYTHERAPY IMPLANT;  Surgeon: Rosalind Zachary NOVAK, MD;  Location: [facility];  Service: Urology;  Laterality: N/A;  90 MINS   SPACE OAR INSTILLATION N/A 01/21/2022   Procedure: SPACE OAR INSTILLATION;  Surgeon: Rosalind Zachary NOVAK, MD;  Location: [facility];  Service: Urology;  Laterality: N/A;   TOTAL KNEE ARTHROPLASTY Left 01/07/2023   @ [facility] by dr edna    Social History[1]  Family History  Problem Relation Age of Onset   Hypertension Mother    Heart disease Mother    Alcohol abuse Mother    Cancer Father        prostate cancer   Sleep apnea Brother     Allergies[2]  Medication list has been reviewed and  updated.  Medications Ordered Prior to Encounter[3]  Review of Systems:  As per HPI- otherwise negative.   Physical Examination: There were no vitals filed for this visit. There were no vitals filed for this visit. There is no height or weight on file to calculate BMI. Ideal Body Weight:    GEN: no acute distress. HEENT: Atraumatic, Normocephalic.  Ears and Nose: No external deformity. CV: RRR, No M/G/R. No JVD. No thrill. No extra heart sounds. PULM: CTA B, no wheezes, crackles, rhonchi. No retractions. No resp. distress. No accessory muscle use. ABD: S, NT, ND, +BS. No rebound. No HSM. EXTR: No c/c/e PSYCH: Normally interactive.  Conversant.    Assessment and Plan: No diagnosis found.  Assessment & Plan   Signed Harlene Schroeder, MD    [1]  Social History Tobacco Use   Smoking status: Former    Types: Pipe   Smokeless tobacco: Never   Tobacco comments:    yuck!  Vaping Use   Vaping status: Never Used  Substance Use Topics   Alcohol use: Yes    Alcohol/week: 5.0 standard drinks of alcohol    Types: 3 Glasses of wine, 2 Standard drinks or equivalent per week   Drug use: No  [2]  Allergies Allergen Reactions   Buspar [Buspirone]     Pt stated does remember reaction but does remember it was allergic reaction   Mobic [Meloxicam] Nausea And Vomiting   Statins     myalgias  [3]  Current Outpatient Medications on File Prior to Visit  Medication Sig Dispense Refill   acyclovir  (ZOVIRAX ) 400 MG tablet TAKE ONE TABLET BY MOUTH THREE TIMES A DAY AS NEEDED FOR OUTBREAKS 45 tablet 3   ALPRAZolam  (XANAX ) 1 MG tablet TAKE A HALF TABLET BY MOUTH AT NOON AND THEN TAKE A HALF TABLET BY MOUTH EVERY NIGHT AT BEDTIME, MAY TAKE A WHOLE TABLET IF NEEDED ON OCCASION 60 tablet 1   benzonatate  (TESSALON ) 100 MG capsule Take 1 capsule (100 mg total) by mouth 3 (three) times daily as needed for cough. 30 capsule 0   betamethasone valerate lotion (VALISONE) 0.1 % Apply 1 application  topically 2 (two) times daily as needed.     doxycycline  (VIBRA -TABS) 100 MG tablet Take 1 tablet (100 mg total) by mouth 2 (two) times daily. 14 tablet 0   Evolocumab  (REPATHA  SURECLICK) 140 MG/ML SOAJ Inject 140 mg into the skin every 14 (fourteen) days. 2 mL 11   hydrOXYzine  (ATARAX ) 25 MG tablet TAKE 1 TABLET BY MOUTH EVERY 8 HOURS AS NEEDED FOR ITCHING 45 tablet 0   lisinopril  (ZESTRIL ) 5 MG tablet Take 1 tablet (5 mg total) by mouth daily. 90 tablet 0   omeprazole  (PRILOSEC) 40 MG capsule TAKE 1 CAPSULE BY MOUTH DAILY 30 capsule 3   propranolol  (INDERAL ) 10 MG tablet TAKE 1 TABLET BY MOUTH 2 TIMES A DAY AS NEEDED FOR PERFORMANCE ANXIETY 30  tablet 3   tadalafil  (CIALIS ) 20 MG tablet Take 1 tablet (20 mg total) by mouth daily as needed for erectile dysfunction. 30 tablet 2   tamsulosin  (FLOMAX ) 0.4 MG CAPS capsule Take 1 capsule (0.4 mg total) by mouth daily after supper. (Patient taking differently: Take 0.4 mg by mouth daily after supper.) 30 capsule 2   traMADol  (ULTRAM ) 50 MG tablet TAKE 1 TABLET BY MOUTH EVERY 8 HOURS AS NEEDED 30 tablet 2   Trolamine Salicylate (ASPERCREME EX) Apply topically as needed.     No current facility-administered medications on file prior  to visit.   "

## 2025-01-23 ENCOUNTER — Telehealth: Admitting: Family Medicine

## 2025-01-23 ENCOUNTER — Ambulatory Visit: Admitting: Family Medicine

## 2025-01-23 ENCOUNTER — Encounter: Payer: Self-pay | Admitting: Family Medicine

## 2025-01-23 DIAGNOSIS — F411 Generalized anxiety disorder: Secondary | ICD-10-CM

## 2025-01-23 DIAGNOSIS — G8929 Other chronic pain: Secondary | ICD-10-CM

## 2025-01-27 ENCOUNTER — Other Ambulatory Visit: Payer: Self-pay | Admitting: Family Medicine

## 2025-01-27 DIAGNOSIS — N529 Male erectile dysfunction, unspecified: Secondary | ICD-10-CM

## 2025-05-31 ENCOUNTER — Ambulatory Visit

## 2025-06-26 ENCOUNTER — Ambulatory Visit: Admitting: Family Medicine
